# Patient Record
Sex: Female | Born: 1975
Health system: Southern US, Community
[De-identification: ages and names within clinical notes are randomized; demographics above are authoritative.]

## PROBLEM LIST (undated history)

## (undated) ENCOUNTER — Inpatient Hospital Stay (HOSPITAL_COMMUNITY): Payer: Self-pay

## (undated) DIAGNOSIS — N189 Chronic kidney disease, unspecified: Secondary | ICD-10-CM

## (undated) DIAGNOSIS — O09529 Supervision of elderly multigravida, unspecified trimester: Secondary | ICD-10-CM

## (undated) DIAGNOSIS — R87619 Unspecified abnormal cytological findings in specimens from cervix uteri: Secondary | ICD-10-CM

## (undated) DIAGNOSIS — D649 Anemia, unspecified: Secondary | ICD-10-CM

## (undated) DIAGNOSIS — O9989 Other specified diseases and conditions complicating pregnancy, childbirth and the puerperium: Secondary | ICD-10-CM

## (undated) DIAGNOSIS — I499 Cardiac arrhythmia, unspecified: Secondary | ICD-10-CM

## (undated) DIAGNOSIS — Z5189 Encounter for other specified aftercare: Secondary | ICD-10-CM

## (undated) DIAGNOSIS — I10 Essential (primary) hypertension: Secondary | ICD-10-CM

## (undated) HISTORY — DX: Chronic kidney disease, unspecified: N18.9

## (undated) HISTORY — DX: Supervision of elderly multigravida, unspecified trimester: O09.529

## (undated) HISTORY — DX: Essential (primary) hypertension: I10

## (undated) HISTORY — DX: Anemia, unspecified: D64.9

## (undated) HISTORY — DX: Other specified diseases and conditions complicating pregnancy, childbirth and the puerperium: O99.89

## (undated) HISTORY — DX: Unspecified abnormal cytological findings in specimens from cervix uteri: R87.619

## (undated) HISTORY — DX: Cardiac arrhythmia, unspecified: I49.9

## (undated) HISTORY — PX: CERVICAL BIOPSY  W/ LOOP ELECTRODE EXCISION: SUR135

---
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
## 2001-11-16 HISTORY — PX: WISDOM TOOTH EXTRACTION: SHX21

## 2003-11-17 DIAGNOSIS — R87619 Unspecified abnormal cytological findings in specimens from cervix uteri: Secondary | ICD-10-CM

## 2003-11-17 HISTORY — DX: Unspecified abnormal cytological findings in specimens from cervix uteri: R87.619

## 2011-08-03 ENCOUNTER — Ambulatory Visit (INDEPENDENT_AMBULATORY_CARE_PROVIDER_SITE_OTHER): Payer: PRIVATE HEALTH INSURANCE | Admitting: Advanced Practice Midwife

## 2011-08-03 DIAGNOSIS — O039 Complete or unspecified spontaneous abortion without complication: Secondary | ICD-10-CM

## 2011-08-03 NOTE — Progress Notes (Deleted)
   Subjective:    Connie Mills is a G1P0 Unknown being seen today for her first obstetrical visit.  Her obstetrical history is significant for {ob risk factors:10154}. Patient {does/does not:19097} intend to breast feed. Pregnancy history fully reviewed.  Patient reports {sx:14538}.  There were no vitals filed for this visit.  HISTORY: OB History    Grav Para Term Preterm Abortions TAB SAB Ect Mult Living   1              # Outc Date GA Lbr Len/2nd Wgt Sex Del Anes PTL Lv   1 GRA            Comments: System Generated. Please review and update pregnancy details.     No past medical history on file. No past surgical history on file. No family history on file.   Exam    Uterine Size: {fundal height:14540}  Pelvic Exam:    Perineum: {Exam; anus and perineum:14598}   Vulva: {vulva exam:14487}   Vagina:  {vaginal exam:14498}   pH: ***   Cervix: {cervix exam:14595}   Adnexa: {adnexa:12223}   Bony Pelvis: {desc; pelvis:14491}  System: Breast:  {Exam; breast:13139::"normal appearance, no masses or tenderness"}   Skin: {pe skin brief ob:314459::"normal coloration and turgor, no rashes"}    Neurologic: {Exam; neuro:16375}   Extremities: {Exam; extremities:15096}   HEENT {exam; ZOXWR:60454}   Mouth/Teeth {pe mouth simple ob:314450::"mucous membranes moist, pharynx normal without lesions"}   Neck {Neck (ob):32092}   Cardiovascular: {HEART EXAM HEM/ONC:21750}   Respiratory:  {Exam; respiratory:5782::"appears well, vitals normal, no respiratory distress, acyanotic, normal RR","ear and throat exam is normal","neck free of mass or lymphadenopathy","chest clear, no wheezing, crepitations, rhonchi, normal symmetric air entry"}   Abdomen: {Exam; abdomen:16834}   Urinary: {exam; urinary female:30845}      Assessment:    Pregnancy: G1P0 There is no problem list on file for this patient.       Plan:     Initial labs drawn. Prenatal vitamins. Problem list reviewed and  updated. Genetic Screening discussed {GENETIC SCREENING TEST:22046}: {requests/ordered/declines:14581}.  Ultrasound discussed; fetal survey: {requests/ordered/declines:14581}.  Follow up in {numbers 0-4:31231} weeks. ***% of *** min visit spent on counseling and coordination of care.  ***   FRAZIER,NATALIE 08/03/2011

## 2011-08-03 NOTE — Patient Instructions (Addendum)
Miscarriage  An early pregnancy loss or spontaneous abortion (miscarriage) is a common problem. This usually happens when the pregnancy is not developing normally. It is very unlikely that you or your partner did anything to cause this, although cigarette smoking, a sexually transmitted disease, excessive alcohol use, or drug abuse can increase the risk. Other causes are:   Abnormalities of the uterus.    Hormone or medical problems.    Trauma or genetic (chromosome) problems.   Having a miscarriage does not change your chances of having a normal pregnancy in the future. Your caregiver will advise when to try to get pregnant again.  AFTER A MISCARRIAGE   A miscarriage is inevitable when there is continual, heavy vaginal bleeding; cramping; dilation of the outlet of the womb (cervix); or passing of any pregnancy tissue. Bleeding and cramping will usually continue until all the tissue has been removed from the womb (uterus).    Often the uterus does not clean itself out completely and a medication or a D&C procedure is needed to loosen or remove the pregnancy tissue from the uterus. A D&C scrapes or suctions the tissue out.    If you are RH negative, you may need to have Rh immune globulin to avoid Rh problems.    You may be given medication to fight an infection if the miscarriage was due to an infection.   HOME CARE INSTRUCTIONS   You should rest in bed for the next 2 to 3 days.    Do not take tub baths or put anything in your birth canal (vagina), including tampons or douche.    Avoid exercise or heavy activities until directed by your caregiver.    Do not have sex until your caregiver approves.    Save any vaginal discharge that looks like tissue. Ask your caregiver if he or she wants to inspect the discharge.    If you and your partner are having problems with guilt or grieving, talk to your caregiver or get counseling to help you understand and cope with your pregnancy loss.    Allow enough time to  grieve before trying to get pregnant again.   SEEK IMMEDIATE MEDICAL CARE IF:   You have persistent heavy bleeding or a bad smelling vaginal discharge.    You have continued belly (abdominal) or pelvic pain.    You develop a fever.    You have severe weakness, fainting, or repeated vomiting.    You develop chills.    You are experiencing domestic violence.   MAKE SURE YOU:   Understand these instructions.    Will watch your condition.    Will get help right away if you are not doing well or get worse.   Document Released: 12/10/2004 Document Re-Released: 04/22/2010  [employer] Patient Information 2011 [employer].

## 2011-08-03 NOTE — Progress Notes (Signed)
B/P 202/93  B/P recheck 179/88  Pt referred to Dr Linford Arnold  For B/P monitoring P 60  Wt 188lb

## 2011-08-03 NOTE — Progress Notes (Signed)
Addended by: Granville Lewis on: 08/03/2011 04:48 PM   Modules accepted: Orders

## 2011-08-03 NOTE — Progress Notes (Signed)
Subjective:    Patient ID: Connie Mills, female    DOB: 1976-08-11, 35 y.o.   MRN: [id number]  HPI Pt was just informed by her PCP, Dr. Orson Slick in [place], [place] that she appears to be having a miscarriage. Was originally scheduled for New OB visit today. Bleeding started on Tuesday, + UPT on Wednesday. HCG 204 at 12 AM on 9/14, 112 at 3:45 PM on 9/14 (different labs). Pt states that she passed tissue believed to be POC, sent for pathology at [facility], results are pending. U/S showed no IUP. Bleeding has decreased, only mild cramping and light bleeding today. PMH significant for CHTN, was on Topral and Norvasc, was switched to Aldomet 250 mg BID by her PCP as she is trying to conceive.   Past Medical History  Diagnosis Date  . Hypertension   . Abnormal Pap smear of cervix 2005    Leep  . Blood transfusion complicating pregnancy 1993    OB History    Grav Para Term Preterm Abortions TAB SAB Ect Mult Living   2 1 1  1  1   1           Review of Systems  Constitutional: Negative for fever, chills and fatigue.  Respiratory: Negative.   Cardiovascular: Negative.   Gastrointestinal: Negative for nausea, vomiting, abdominal pain, diarrhea and constipation.  Genitourinary: Positive for vaginal bleeding. Negative for dysuria, urgency, frequency, hematuria, flank pain, vaginal discharge, difficulty urinating, genital sores, vaginal pain, menstrual problem, pelvic pain and dyspareunia.  Musculoskeletal: Negative.   Neurological: Negative.   Psychiatric/Behavioral: Negative.        Objective:   Physical Exam  Nursing note and vitals reviewed. Constitutional: She is oriented to person, place, and time. She appears well-developed and well-nourished. No distress.  Cardiovascular: Normal rate.   Pulmonary/Chest: Effort normal.  Abdominal: Soft. There is no tenderness.  Musculoskeletal: Normal range of motion.  Neurological: She is alert and oriented to person, place, and time.  Skin: Skin is  warm and dry.  Psychiatric: She has a normal mood and affect. Her behavior is normal.       tearful          Assessment & Plan:  35 y.o. G2P1011  ? SAB, repeat quant today If quant decreased, will follow weekly until negative If pain or bleeding increases, go to MAU Pelvic rest, may resume efforts at conception after 1 normal cycle F/U with PCP regarding BP, phone number given for Dr. Eppie Gibson (current PCP is 2 hours away)

## 2011-08-04 LAB — HCG, QUANTITATIVE, PREGNANCY: hCG, Beta Chain, Quant, S: 9.2 m[IU]/mL

## 2011-08-05 ENCOUNTER — Telehealth: Payer: Self-pay | Admitting: *Deleted

## 2011-08-05 NOTE — Telephone Encounter (Signed)
Pt given test results of BHCG and recheck of her B/P was 145/73.  She does have an appt with Dr. Eppie Gibson in [facility]

## 2011-08-10 ENCOUNTER — Other Ambulatory Visit (INDEPENDENT_AMBULATORY_CARE_PROVIDER_SITE_OTHER): Payer: PRIVATE HEALTH INSURANCE

## 2011-08-10 DIAGNOSIS — O039 Complete or unspecified spontaneous abortion without complication: Secondary | ICD-10-CM

## 2011-08-10 NOTE — Progress Notes (Signed)
Pt here for lab only BHCG for f/u SAB.  Will call pt with results

## 2011-08-10 NOTE — Progress Notes (Deleted)
Subjective:     Patient ID: Connie Mills, female   DOB: 11-Oct-1976, 35 y.o.   MRN: [id number]  HPI   Review of Systems     Objective:   Physical Exam     Assessment:     ***    Plan:     ***

## 2011-08-11 ENCOUNTER — Telehealth: Payer: Self-pay | Admitting: *Deleted

## 2011-08-11 LAB — HCG, QUANTITATIVE, PREGNANCY: hCG, Beta Chain, Quant, S: <2 m[IU]/mL

## 2011-08-11 NOTE — Telephone Encounter (Signed)
Pt notified of BHCG <2 which is neg.  Advised pt to wait for 2 cycles before attempting pregnancy.

## 2011-08-12 ENCOUNTER — Encounter: Payer: Self-pay | Admitting: Family Medicine

## 2011-08-12 ENCOUNTER — Telehealth: Payer: Self-pay | Admitting: Family Medicine

## 2011-08-12 ENCOUNTER — Ambulatory Visit (INDEPENDENT_AMBULATORY_CARE_PROVIDER_SITE_OTHER): Payer: PRIVATE HEALTH INSURANCE | Admitting: Family Medicine

## 2011-08-12 VITALS — BP 137/65 | HR 51 | Wt 190.0 lb

## 2011-08-12 DIAGNOSIS — I10 Essential (primary) hypertension: Secondary | ICD-10-CM

## 2011-08-12 NOTE — Patient Instructions (Addendum)
Make sure getting under 1500mg  a day.   We will call you in the next day or two about your labs.

## 2011-08-12 NOTE — Telephone Encounter (Signed)
Call patient: I did receive her lab results from her prior physician. She did have normal kidney function and electrolytes. They have not done a thyroid test I would like to order this. We will print a lab slip and she can go any time this week or next week for the blood work.

## 2011-08-12 NOTE — Telephone Encounter (Signed)
Pt.notified

## 2011-08-12 NOTE — Progress Notes (Signed)
Subjective:    Patient ID: Connie Mills, female    DOB: 1976/07/12, 35 y.o.   MRN: [id number]  HPI  Had a miscarriage about 2 weks ago and ws on plendil and toprol 50 xl at night.  She has been more stressed and emotional since the miscarriage.  Home BPS have been running 160-170s at home and HR in the 50. They inc her aldomet to tid about a week ago.  Has been having higher BP.  Thought last night BP was its best at 130/90. Thought this AM was 151/92.    Review of Systems  Constitutional: Negative for fever, diaphoresis and unexpected weight change.  HENT: Negative for hearing loss, rhinorrhea and tinnitus.   Eyes: Negative for visual disturbance.  Respiratory: Negative for cough and wheezing.   Cardiovascular: Negative for chest pain and palpitations.  Gastrointestinal: Negative for nausea, vomiting, diarrhea and blood in stool.  Genitourinary: Negative for vaginal bleeding, vaginal discharge and difficulty urinating.  Musculoskeletal: Negative for myalgias and arthralgias.  Skin: Negative for rash.  Neurological: Positive for headaches.  Hematological: Negative for adenopathy. Does not bruise/bleed easily.  Psychiatric/Behavioral: Negative for sleep disturbance and dysphoric mood. The patient is not nervous/anxious.    Connie Mills     Objective:   Physical Exam  Constitutional: She is oriented to person, place, and time. She appears well-developed and well-nourished.  HENT:  Head: Normocephalic and atraumatic.  Eyes: Conjunctivae are normal. Pupils are equal, round, and reactive to light.  Neck: Neck supple. No thyromegaly present.  Cardiovascular: Normal rate and regular rhythm.   Pulmonary/Chest: Effort normal and breath sounds normal.  Lymphadenopathy:    She has no cervical adenopathy.  Neurological: She is alert and oriented to person, place, and time.  Skin: Skin is warm and dry.  Psychiatric: She has a normal mood and affect. Her behavior is normal.   No carotid bruits.  No ankle edema.        Assessment & Plan:  Miscarriage - I did offered counseling. She declined at this time. She says she has a good support system.   HTN- Not well controlled. I want to make sure she has an up to  Will get CMP and TSH to make sure these are normal. Also hormonally her body is adjusting. Will call her prior PCP with this. She recently moved her. If all normal and up to date then I will increase her BP med. Continue to follow BPS daily.

## 2011-08-13 LAB — TSH: TSH: 1.319 u[IU]/mL (ref 0.350–4.500)

## 2011-08-28 ENCOUNTER — Encounter (HOSPITAL_COMMUNITY): Payer: Self-pay | Admitting: *Deleted

## 2011-08-28 ENCOUNTER — Inpatient Hospital Stay (HOSPITAL_COMMUNITY)
Admission: AD | Admit: 2011-08-28 | Discharge: 2011-08-28 | Disposition: A | Discharge status: Home / Self Care | Payer: PRIVATE HEALTH INSURANCE | Source: Ambulatory Visit | Attending: Obstetrics and Gynecology | Admitting: Obstetrics and Gynecology

## 2011-08-28 ENCOUNTER — Inpatient Hospital Stay (HOSPITAL_COMMUNITY): Payer: PRIVATE HEALTH INSURANCE

## 2011-08-28 DIAGNOSIS — R109 Unspecified abdominal pain: Secondary | ICD-10-CM | POA: Insufficient documentation

## 2011-08-28 DIAGNOSIS — N83209 Unspecified ovarian cyst, unspecified side: Secondary | ICD-10-CM

## 2011-08-28 LAB — URINALYSIS, ROUTINE W REFLEX MICROSCOPIC
Bilirubin Urine: NEGATIVE
Glucose, UA: NEGATIVE mg/dL
Hgb urine dipstick: NEGATIVE
Ketones, ur: NEGATIVE mg/dL
Leukocytes, UA: NEGATIVE
Nitrite: NEGATIVE
Protein, ur: NEGATIVE mg/dL
Specific Gravity, Urine: 1.025 (ref 1.005–1.030)
Urobilinogen, UA: 0.2 mg/dL (ref 0.0–1.0)
pH: 6 (ref 5.0–8.0)

## 2011-08-28 LAB — WET PREP, GENITAL
Clue Cells Wet Prep HPF POC: NONE SEEN
Trich, Wet Prep: NONE SEEN
Yeast Wet Prep HPF POC: NONE SEEN

## 2011-08-28 LAB — POCT PREGNANCY, URINE: Preg Test, Ur: NEGATIVE

## 2011-08-28 MED ORDER — IBUPROFEN 600 MG PO TABS: 600.0000 mg | ORAL_TABLET | Freq: Four times a day (QID) | ORAL | Status: AC | PRN

## 2011-08-28 MED ORDER — KETOROLAC TROMETHAMINE 60 MG/2ML IM SOLN
60.0000 mg | Freq: Once | INTRAMUSCULAR | Status: AC
  Administered 2011-08-28: 60 mg via INTRAMUSCULAR
  Filled 2011-08-28: qty 2

## 2011-08-28 NOTE — Progress Notes (Signed)
Pt states she had a miscarriage in September and had her HCG levels followed at her MD down to 2. Pt taking Tylenol with codeine with minimal relief. C/o left lower constant pain, no vaginal bleeding or discharge.

## 2011-08-28 NOTE — ED Provider Notes (Signed)
History     Chief Complaint  Patient presents with  . Abdominal Pain   HPI Pt states she had a miscarriage in September and had her HCG levels followed at her MD down to 2. Pt taking Tylenol with codeine with minimal relief. C/o unresolved left lower pelvic pain, no vaginal bleeding or discharge.  Previously seen at [facility].     Past Medical History  Diagnosis Date  . Hypertension   . Blood transfusion complicating pregnancy 1993  . Abnormal Pap smear of cervix 2005    Leep; normal pap after    Past Surgical History  Procedure Date  . Cesarean section 1993     Breech  . Wisdom tooth extraction 2003  . Cervical biopsy  w/ loop electrode excision     Family History  Problem Relation Age of Onset  . Hypertension Mother   . Hypertension Brother   . Diabetes Maternal Grandmother   . Cancer Paternal Grandfather   . Hypertension Sister   . Thyroid disease Sister   . Thyroid disease Maternal Aunt     History  Substance Use Topics  . Smoking status: Never Smoker   . Smokeless tobacco: Never Used  . Alcohol Use: No    Allergies:  Allergies  Allergen Reactions  . Dilaudid (Hydromorphone Hcl) Itching    Prescriptions prior to admission  Medication Sig Dispense Refill  . acetaminophen (TYLENOL) 500 MG tablet Take 500 mg by mouth every 6 (six) hours as needed. For pain       . HYDROcodone-acetaminophen (LORTAB) 7.5-500 MG per tablet Take 1 tablet by mouth every 4 (four) hours as needed. For pain       . methyldopa (ALDOMET) 250 MG tablet Take 250 mg by mouth 3 (three) times daily.       . Multiple Vitamin (MULTIVITAMIN) tablet Take 1 tablet by mouth daily.         Review of Systems  Gastrointestinal: Positive for abdominal pain.  All other systems reviewed and are negative.   Physical Exam   Blood pressure 162/99, pulse 68, temperature 99.7 F (37.6 C), temperature source Oral, resp. rate 16, height 5' 6.5" (1.689 m), weight 85.367 kg (188 lb 3.2 oz),  last menstrual period 06/16/2011, SpO2 99.00%, unknown if currently breastfeeding.  Physical Exam  Constitutional: She is oriented to person, place, and time. She appears well-developed and well-nourished.  HENT:  Head: Normocephalic.  Neck: Normal range of motion. Neck supple.  Cardiovascular: Normal rate, regular rhythm and normal heart sounds.   Respiratory: Effort normal and breath sounds normal.  GI: Soft. She exhibits no mass. There is tenderness (LLQ). There is no guarding.  Genitourinary: Uterus is not enlarged. Cervix exhibits no motion tenderness. Right adnexum displays no mass, no tenderness and no fullness. Left adnexum displays tenderness and fullness. No bleeding around the vagina.       Negative cervical motion tenderness  Neurological: She is alert and oriented to person, place, and time.  Skin: Skin is warm and dry.    MAU Course  Procedures  Toradol 60 mg IM [place] - Left ovary: Normal appearance/no adnexal mass, measuring 3.7 x 2.2  x 2.6 cm. Suspected 2.3 cm corpus luteal cyst. Urine pregnancy - negative Wet prep - negative   Assessment and Plan  Left Ovarian Cyst  Plan: DC to home RX Ibuprofen F/U prn  [facility] 08/28/2011, 6:02 PM

## 2011-08-28 NOTE — Progress Notes (Signed)
Pt states she had a miscarriage in September and was seen at [facility]. Had follow up with her primary physician until [facility] were 2. States she has had LLQ pain since the miscarriage. No bleeding or discharge.

## 2011-08-29 LAB — GC/CHLAMYDIA PROBE AMP, GENITAL
Chlamydia, DNA Probe: NEGATIVE
GC Probe Amp, Genital: NEGATIVE

## 2011-09-02 NOTE — ED Provider Notes (Signed)
Agree with above note.  Connie Mills 09/02/2011 4:18 PM

## 2011-09-04 ENCOUNTER — Ambulatory Visit: Payer: PRIVATE HEALTH INSURANCE

## 2011-09-17 ENCOUNTER — Telehealth: Payer: Self-pay | Admitting: Family Medicine

## 2011-09-17 NOTE — Telephone Encounter (Signed)
Department of social services calling and needs information since pt has applied for pregnancy medicaid coverage.   Plan:  Told the rep calling I will need the pt to call and give verbal permission for me to release information regarding pt situation.  Rep voiced understanding and will have the pt call our office to give verbal permission to release information. Jarvis Newcomer, LPN [facility]

## 2011-09-29 ENCOUNTER — Ambulatory Visit (INDEPENDENT_AMBULATORY_CARE_PROVIDER_SITE_OTHER): Payer: PRIVATE HEALTH INSURANCE | Admitting: Obstetrics & Gynecology

## 2011-09-29 ENCOUNTER — Encounter: Payer: Self-pay | Admitting: Obstetrics & Gynecology

## 2011-09-29 VITALS — BP 133/60 | HR 50 | Temp 98.4°F | Resp 16 | Ht 67.0 in | Wt 195.0 lb

## 2011-09-29 DIAGNOSIS — Z0000 Encounter for general adult medical examination without abnormal findings: Secondary | ICD-10-CM

## 2011-09-29 DIAGNOSIS — Z113 Encounter for screening for infections with a predominantly sexual mode of transmission: Secondary | ICD-10-CM

## 2011-09-29 DIAGNOSIS — Z1272 Encounter for screening for malignant neoplasm of vagina: Secondary | ICD-10-CM

## 2011-09-29 DIAGNOSIS — O0910 Supervision of pregnancy with history of ectopic or molar pregnancy, unspecified trimester: Secondary | ICD-10-CM

## 2011-09-29 DIAGNOSIS — Z01419 Encounter for gynecological examination (general) (routine) without abnormal findings: Secondary | ICD-10-CM

## 2011-09-29 LAB — POCT URINE PREGNANCY: Preg Test, Ur: POSITIVE

## 2011-09-29 MED ORDER — ONDANSETRON HCL 8 MG PO TABS: 8.0000 mg | ORAL_TABLET | Freq: Three times a day (TID) | ORAL | Status: DC | PRN

## 2011-09-29 NOTE — Progress Notes (Signed)
Subjective:    Connie Mills is a 35 y.o. female who presents for an annual exam. The patient has no complaints today except for abdominal bloating and nausea, breasts somewhat sore.  She is having unprotected sex. The patient is sexually active. GYN screening history: last pap: was normal. The patient wears seatbelts: yes. The patient participates in regular exercise: no. Has the patient ever been transfused or tattooed?: yes.(needed a transfusion after the delivery of her son). The patient reports that there is not domestic violence in her life.   Menstrual History: OB History    Grav Para Term Preterm Abortions TAB SAB Ect Mult Living   2 1 1  1  1   1       Menarche age: 26 No LMP recorded.    The following portions of the patient's history were reviewed and updated as appropriate: allergies, current medications, past family history, past medical history, past social history, past surgical history and problem list.  Review of Systems A comprehensive review of systems was negative.    Objective:    BP 133/60  Pulse 50  Temp(Src) 98.4 F (36.9 C) (Oral)  Resp 16  Ht 5\' 7"  (1.702 m)  Wt 195 lb (88.451 kg)  BMI 30.54 kg/m2  Breastfeeding? No  General Appearance:    Alert, cooperative, no distress, appears stated age  Head:    Normocephalic, without obvious abnormality, atraumatic  Eyes:    PERRL, conjunctiva/corneas clear, EOM's intact, fundi    benign, both eyes  Ears:    Normal TM's and external ear canals, both ears  Nose:   Nares normal, septum midline, mucosa normal, no drainage    or sinus tenderness  Throat:   Lips, mucosa, and tongue normal; teeth and gums normal  Neck:   Supple, symmetrical, trachea midline, no adenopathy;    thyroid:  no enlargement/tenderness/nodules; no carotid   bruit or JVD  Back:     Symmetric, no curvature, ROM normal, no CVA tenderness  Lungs:     Clear to auscultation bilaterally, respirations unlabored  Chest Wall:    No tenderness or  deformity   Heart:    Regular rate and rhythm, S1 and S2 normal, no murmur, rub   or gallop  Breast Exam:    No tenderness, masses, or nipple abnormality  Abdomen:     Soft, non-tender, bowel sounds active all four quadrants,    no masses, no organomegaly  Genitalia:    Normal female without lesion, discharge or tenderness     Extremities:   Extremities normal, atraumatic, no cyanosis or edema  Pulses:   2+ and symmetric all extremities  Skin:   Skin color, texture, turgor normal, no rashes or lesions  Lymph nodes:   Cervical, supraclavicular, and axillary nodes normal  Neurologic:   CNII-XII intact, normal strength, sensation and reflexes    throughout  .    Assessment:    Healthy female exam.  Positive pregnancy test   Plan:     Thin prep Pap smear. CVX cultures. Schedule u/s for dating Schedule NOB visit.  I will refill zofran at her request.

## 2011-09-29 NOTE — Progress Notes (Signed)
Addended by: Granville Lewis on: 09/29/2011 05:10 PM   Modules accepted: Orders

## 2011-09-29 NOTE — Progress Notes (Signed)
Addended by: Granville Lewis on: 09/29/2011 05:20 PM   Modules accepted: Orders

## 2011-10-02 ENCOUNTER — Other Ambulatory Visit: Payer: Self-pay | Admitting: Obstetrics & Gynecology

## 2011-10-02 ENCOUNTER — Other Ambulatory Visit: Payer: PRIVATE HEALTH INSURANCE

## 2011-10-02 DIAGNOSIS — O0910 Supervision of pregnancy with history of ectopic or molar pregnancy, unspecified trimester: Secondary | ICD-10-CM

## 2011-10-05 ENCOUNTER — Ambulatory Visit
Admission: RE | Admit: 2011-10-05 | Discharge: 2011-10-05 | Disposition: A | Discharge status: Home / Self Care | Payer: PRIVATE HEALTH INSURANCE | Source: Ambulatory Visit | Attending: Obstetrics & Gynecology | Admitting: Obstetrics & Gynecology

## 2011-10-05 DIAGNOSIS — O0910 Supervision of pregnancy with history of ectopic or molar pregnancy, unspecified trimester: Secondary | ICD-10-CM

## 2011-10-06 ENCOUNTER — Encounter: Payer: Self-pay | Admitting: Obstetrics & Gynecology

## 2011-10-06 ENCOUNTER — Ambulatory Visit (INDEPENDENT_AMBULATORY_CARE_PROVIDER_SITE_OTHER): Payer: PRIVATE HEALTH INSURANCE | Admitting: Obstetrics & Gynecology

## 2011-10-06 DIAGNOSIS — O10019 Pre-existing essential hypertension complicating pregnancy, unspecified trimester: Secondary | ICD-10-CM

## 2011-10-06 DIAGNOSIS — B3731 Acute candidiasis of vulva and vagina: Secondary | ICD-10-CM

## 2011-10-06 DIAGNOSIS — B373 Candidiasis of vulva and vagina: Secondary | ICD-10-CM

## 2011-10-06 DIAGNOSIS — O09529 Supervision of elderly multigravida, unspecified trimester: Secondary | ICD-10-CM

## 2011-10-06 MED ORDER — FLUCONAZOLE 150 MG PO TABS: 150.0000 mg | ORAL_TABLET | Freq: Once | ORAL | Status: AC

## 2011-10-06 NOTE — Progress Notes (Signed)
p-70  Needs RX for yeast from pap smear

## 2011-10-06 NOTE — Progress Notes (Signed)
Patient underwent full annual exam on 09/29/11; no exam indicated today.  It was during that encounter that she discovered she was pregnant; ultrasound done yesterday confirmed a 9 week viable IUP.  Of note, pap smear showed candida vulvovaginitis, patient desires Diflucan for treatment.  Patient counseled regarding chronic HTN in pregnancy, risks of HTN in pregnancy and need increased surveillance.  Told to continue Methyldopa for now, will adjust regimen as needed. Patient is AMA, recommended first trimester screening, will be ordered today.  OB labs ordered, patient also to collect 24 hour urine for baseline proteinuria analysis. Routine first trimester counseling provided.  Patient to RTC in one week to recheck her BP, address any further concerns and draw prenatal labs.

## 2011-10-06 NOTE — Patient Instructions (Signed)
Pregnancy - First Trimester During sexual intercourse, millions of sperm go into the vagina. Only 1 sperm will penetrate and fertilize the female egg while it is in the Fallopian tube. One week later, the fertilized egg implants into the wall of the uterus. An embryo begins to develop into a baby. At 6 to 8 weeks, the eyes and face are formed and the heartbeat can be seen on ultrasound. At the end of 12 weeks (first trimester), all the baby's organs are formed. Now that you are pregnant, you will want to do everything you can to have a healthy baby. Two of the most important things are to get good prenatal care and follow your caregiver's instructions. Prenatal care is all the medical care you receive before the baby's birth. It is given to prevent, find, and treat problems during the pregnancy and childbirth. PRENATAL EXAMS  During prenatal visits, your weight, blood pressure and urine are checked. This is done to make sure you are healthy and progressing normally during the pregnancy.   A pregnant woman should gain 25 to 35 pounds during the pregnancy. However, if you are over weight or underweight, your caregiver will advise you regarding your weight.   Your caregiver will ask and answer questions for you.   Blood work, cervical cultures, other necessary tests and a Pap test are done during your prenatal exams. These tests are done to check on your health and the probable health of your baby. Tests are strongly recommended and done for HIV with your permission. This is the virus that causes AIDS. These tests are done because medications can be given to help prevent your baby from being born with this infection should you have been infected without knowing it. Blood work is also used to find out your blood type, previous infections and follow your blood levels (hemoglobin).   Low hemoglobin (anemia) is common during pregnancy. Iron and vitamins are given to help prevent this. Later in the pregnancy,  blood tests for diabetes will be done along with any other tests if any problems develop. You may need tests to make sure you and the baby are doing well.   You may need other tests to make sure you and the baby are doing well.  CHANGES DURING THE FIRST TRIMESTER (THE FIRST 3 MONTHS OF PREGNANCY) Your body goes through many changes during pregnancy. They vary from person to person. Talk to your caregiver about changes you notice and are concerned about. Changes can include:  Your menstrual period stops.   The egg and sperm carry the genes that determine what you look like. Genes from you and your partner are forming a baby. The female genes determine whether the baby is a boy or a girl.   Your body increases in girth and you may feel bloated.   Feeling sick to your stomach (nauseous) and throwing up (vomiting). If the vomiting is uncontrollable, call your caregiver.   Your breasts will begin to enlarge and become tender.   Your nipples may stick out more and become darker.   The need to urinate more. Painful urination may mean you have a bladder infection.   Tiring easily.   Loss of appetite.   Cravings for certain kinds of food.   At first, you may gain or lose a couple of pounds.   You may have changes in your emotions from day to day (excited to be pregnant or concerned something may go wrong with the pregnancy and baby).     You may have more vivid and strange dreams.  HOME CARE INSTRUCTIONS   It is very important to avoid all smoking, alcohol and un-prescribed drugs during your pregnancy. These affect the formation and growth of the baby. Avoid chemicals while pregnant to ensure the delivery of a healthy infant.   Start your prenatal visits by the 12th week of pregnancy. They are usually scheduled monthly at first, then more often in the last 2 months before delivery. Keep your caregiver's appointments. Follow your caregiver's instructions regarding medication use, blood and lab  tests, exercise, and diet.   During pregnancy, you are providing food for you and your baby. Eat regular, well-balanced meals. Choose foods such as meat, fish, milk and other low fat dairy products, vegetables, fruits, and whole-grain breads and cereals. Your caregiver will tell you of the ideal weight gain.   You can help morning sickness by keeping soda crackers at the bedside. Eat a couple before arising in the morning. You may want to use the crackers without salt on them.   Eating 4 to 5 small meals rather than 3 large meals a day also may help the nausea and vomiting.   Drinking liquids between meals instead of during meals also seems to help nausea and vomiting.   A physical sexual relationship may be continued throughout pregnancy if there are no other problems. Problems may be early (premature) leaking of amniotic fluid from the membranes, vaginal bleeding, or belly (abdominal) pain.   Exercise regularly if there are no restrictions. Check with your caregiver or physical therapist if you are unsure of the safety of some of your exercises. Greater weight gain will occur in the last 2 trimesters of pregnancy. Exercising will help:   Control your weight.   Keep you in shape.   Prepare you for labor and delivery.   Help you lose your pregnancy weight after you deliver your baby.   Wear a good support or jogging bra for breast tenderness during pregnancy. This may help if worn during sleep too.   Ask when prenatal classes are available. Begin classes when they are offered.   Do not use hot tubs, steam rooms or saunas.   Wear your seat belt when driving. This protects you and your baby if you are in an accident.   Avoid raw meat, uncooked cheese, cat litter boxes and soil used by cats throughout the pregnancy. These carry germs that can cause birth defects in the baby.   The first trimester is a good time to visit your dentist for your dental health. Getting your teeth cleaned is  OK. Use a softer toothbrush and brush gently during pregnancy.   Ask for help if you have financial, counseling or nutritional needs during pregnancy. Your caregiver will be able to offer counseling for these needs as well as refer you for other special needs.   Do not take any medications or herbs unless told by your caregiver.   Inform your caregiver if there is any mental or physical domestic violence.   Make a list of emergency phone numbers of family, friends, hospital, and police and fire departments.   Write down your questions. Take them to your prenatal visit.   Do not douche.   Do not cross your legs.   If you have to stand for long periods of time, rotate you feet or take small steps in a circle.   You may have more vaginal secretions that may require a sanitary pad. Do not use   tampons or scented sanitary pads.  MEDICATIONS AND DRUG USE IN PREGNANCY  Take prenatal vitamins as directed. The vitamin should contain 1 milligram of folic acid. Keep all vitamins out of reach of children. Only a couple vitamins or tablets containing iron may be fatal to a baby or young child when ingested.   Avoid use of all medications, including herbs, over-the-counter medications, not prescribed or suggested by your caregiver. Only take over-the-counter or prescription medicines for pain, discomfort, or fever as directed by your caregiver. Do not use aspirin, ibuprofen, or naproxen unless directed by your caregiver.   Let your caregiver also know about herbs you may be using.   Alcohol is related to a number of birth defects. This includes fetal alcohol syndrome. All alcohol, in any form, should be avoided completely. Smoking will cause low birth rate and premature babies.   Street or illegal drugs are very harmful to the baby. They are absolutely forbidden. A baby born to an addicted mother will be addicted at birth. The baby will go through the same withdrawal an adult does.   Let your  caregiver know about any medications that you have to take and for what reason you take them.  MISCARRIAGE IS COMMON DURING PREGNANCY A miscarriage does not mean you did something wrong. It is not a reason to worry about getting pregnant again. Your caregiver will help you with questions you may have. If you have a miscarriage, you may need minor surgery. SEEK MEDICAL CARE IF:  You have any concerns or worries during your pregnancy. It is better to call with your questions if you feel they cannot wait, rather than worry about them. SEEK IMMEDIATE MEDICAL CARE IF:   An unexplained oral temperature above 102 F (38.9 C) develops, or as your caregiver suggests.   You have leaking of fluid from the vagina (birth canal). If leaking membranes are suspected, take your temperature and inform your caregiver of this when you call.   There is vaginal spotting or bleeding. Notify your caregiver of the amount and how many pads are used.   You develop a bad smelling vaginal discharge with a change in the color.   You continue to feel sick to your stomach (nauseated) and have no relief from remedies suggested. You vomit blood or coffee ground-like materials.   You lose more than 2 pounds of weight in 1 week.   You gain more than 2 pounds of weight in 1 week and you notice swelling of your face, hands, feet, or legs.   You gain 5 pounds or more in 1 week (even if you do not have swelling of your hands, face, legs, or feet).   You get exposed to German measles and have never had them.   You are exposed to fifth disease or chickenpox.   You develop belly (abdominal) pain. Round ligament discomfort is a common non-cancerous (benign) cause of abdominal pain in pregnancy. Your caregiver still must evaluate this.   You develop headache, fever, diarrhea, pain with urination, or shortness of breath.   You fall or are in a car accident or have any kind of trauma.   There is mental or physical violence in  your home.  Document Released: 10/27/2001 Document Revised: 07/15/2011 Document Reviewed: 04/30/2009 [employer] Patient Information 2012 [employer]. 

## 2011-10-07 ENCOUNTER — Other Ambulatory Visit: Payer: Self-pay | Admitting: Obstetrics & Gynecology

## 2011-10-07 DIAGNOSIS — Z3682 Encounter for antenatal screening for nuchal translucency: Secondary | ICD-10-CM

## 2011-10-07 DIAGNOSIS — O09529 Supervision of elderly multigravida, unspecified trimester: Secondary | ICD-10-CM

## 2011-10-13 ENCOUNTER — Other Ambulatory Visit: Payer: PRIVATE HEALTH INSURANCE

## 2011-10-13 ENCOUNTER — Ambulatory Visit (INDEPENDENT_AMBULATORY_CARE_PROVIDER_SITE_OTHER): Payer: PRIVATE HEALTH INSURANCE | Admitting: *Deleted

## 2011-10-13 VITALS — BP 120/71 | HR 63 | Temp 97.0°F | Resp 16 | Ht 67.0 in | Wt 192.0 lb

## 2011-10-13 DIAGNOSIS — Z23 Encounter for immunization: Secondary | ICD-10-CM

## 2011-10-13 MED ORDER — INFLUENZA VIRUS VACC SPLIT PF IM SUSP
0.5000 mL | INTRAMUSCULAR | Status: AC
  Administered 2011-10-13: 0.5 mL via INTRAMUSCULAR

## 2011-10-19 MED ORDER — FLUCONAZOLE 150 MG PO TABS: 150.0000 mg | ORAL_TABLET | Freq: Once | ORAL | Status: AC

## 2011-10-19 NOTE — Progress Notes (Signed)
Addended by: Granville Lewis on: 10/19/2011 02:52 PM   Modules accepted: Orders

## 2011-10-19 NOTE — Progress Notes (Signed)
Addended by: Jaynie Collins A on: 10/19/2011 03:01 PM   Modules accepted: Orders

## 2011-10-20 LAB — COMPREHENSIVE METABOLIC PANEL
ALT: 15 U/L (ref 0–35)
AST: 14 U/L (ref 0–37)
Albumin: 4 g/dL (ref 3.5–5.2)
Alkaline Phosphatase: 45 U/L (ref 39–117)
BUN: 11 mg/dL (ref 6–23)
CO2: 25 mEq/L (ref 19–32)
Calcium: 9.7 mg/dL (ref 8.4–10.5)
Chloride: 97 mEq/L (ref 96–112)
Creat: 0.76 mg/dL (ref 0.50–1.10)
Glucose, Bld: 76 mg/dL (ref 70–99)
Potassium: 4.2 mEq/L (ref 3.5–5.3)
Sodium: 135 mEq/L (ref 135–145)
Total Bilirubin: 0.3 mg/dL (ref 0.3–1.2)
Total Protein: 7.4 g/dL (ref 6.0–8.3)

## 2011-10-20 LAB — CBC
HCT: 33.2 % — ABNORMAL LOW (ref 36.0–46.0)
Hemoglobin: 10.6 g/dL — ABNORMAL LOW (ref 12.0–15.0)
MCH: 23.5 pg — ABNORMAL LOW (ref 26.0–34.0)
MCHC: 31.9 g/dL (ref 30.0–36.0)
MCV: 73.5 fL — ABNORMAL LOW (ref 78.0–100.0)
Platelets: 310 10*3/uL (ref 150–400)
RBC: 4.52 MIL/uL (ref 3.87–5.11)
RDW: 15.1 % (ref 11.5–15.5)
WBC: 8.9 10*3/uL (ref 4.0–10.5)

## 2011-10-20 LAB — CREATININE CLEARANCE, URINE, 24 HOUR
Creatinine Clearance: 159 mL/min — ABNORMAL HIGH (ref 75–115)
Creatinine, 24H Ur: 1738 mg/d (ref 700–1800)
Creatinine, Urine: 88 mg/dL
Creatinine: 0.76 mg/dL (ref 0.50–1.10)

## 2011-10-20 LAB — PROTEIN, URINE, 24 HOUR

## 2011-10-23 ENCOUNTER — Ambulatory Visit (HOSPITAL_COMMUNITY)
Admission: RE | Admit: 2011-10-23 | Discharge: 2011-10-23 | Disposition: A | Discharge status: Home / Self Care | Payer: PRIVATE HEALTH INSURANCE | Source: Ambulatory Visit | Attending: Obstetrics & Gynecology | Admitting: Obstetrics & Gynecology

## 2011-10-23 ENCOUNTER — Ambulatory Visit (HOSPITAL_COMMUNITY): Admission: RE | Admit: 2011-10-23 | Payer: PRIVATE HEALTH INSURANCE | Source: Ambulatory Visit

## 2011-10-23 DIAGNOSIS — O3510X0 Maternal care for (suspected) chromosomal abnormality in fetus, unspecified, not applicable or unspecified: Secondary | ICD-10-CM | POA: Insufficient documentation

## 2011-10-23 DIAGNOSIS — O351XX0 Maternal care for (suspected) chromosomal abnormality in fetus, not applicable or unspecified: Secondary | ICD-10-CM | POA: Insufficient documentation

## 2011-10-23 DIAGNOSIS — Z3689 Encounter for other specified antenatal screening: Secondary | ICD-10-CM | POA: Insufficient documentation

## 2011-10-23 DIAGNOSIS — Z3682 Encounter for antenatal screening for nuchal translucency: Secondary | ICD-10-CM

## 2011-10-23 DIAGNOSIS — O09529 Supervision of elderly multigravida, unspecified trimester: Secondary | ICD-10-CM

## 2011-10-23 NOTE — Progress Notes (Signed)
Genetic Counseling  High-Risk Gestation Note  Appointment Date:  10/23/2011 Referred By: Tereso Newcomer, MD Date of Birth:  08-16-1976 Partner:  Keane Police  Pregnancy History: [id number] Estimated Date of Delivery: 05/05/12 Estimated Gestational Age: [redacted]w[redacted]d Attending: Berna Spare, MD  Connie Mills and her husband, Mr. Marialuisa Kozan, were seen for genetic counseling because of a maternal age of 35 y.o.Marland Kitchen     They were counseled regarding maternal age and the association with risk for chromosome conditions due to nondisjunction with aging of the ova.   We reviewed chromosomes, nondisjunction, and the associated 1 in 80 risk for fetal aneuploidy related to a maternal age of 35 y.o. at [redacted]w[redacted]d gestation.  They were counseled that the risk for aneuploidy decreases as gestational age increases, accounting for those pregnancies which spontaneously abort.  We specifically discussed Down syndrome (trisomy 26), trisomies 38 and 26, and sex chromosome aneuploidies (47,XXX and 47,XXY) including the common features and prognoses of each.   We reviewed available screening and diagnostic options.  Regarding screening tests, we discussed the options of First screen, Quad screen and ultrasound.  They understand that screening tests are used to modify a patient's a priori risk for aneuploidy, typically based on age.  This estimate provides a pregnancy specific risk assessment.  We also reviewed the availability of diagnostic options including CVS and amniocentesis.  We discussed the risks, limitations, and benefits of each.  After reviewing these options, Ms. Grzeskowiak elected to have ultrasound for nuchal translucency assessment at this time and targeted ultrasound in the second trimester, but declined First screen, Quad screen, CVS and amniocentesis.  The couple stated that they do not wish to pursue additional screening or testing for aneuploidy aside from ultrasound given that screening has the  potential to provoke anxiety. She declined CVS and amniocentesis at this time and in the future given the associated risk of complications. She understands that ultrasound cannot rule out all birth defects or genetic syndromes. However, they were counseled that 50-80% of fetuses with Down syndrome and up to 90% of fetuses with trisomies 13 and 18, when well visualized, have detectable anomalies or soft markers by ultrasound.   Mrs. Turnier was provided with written information regarding sickle cell anemia (SCA) including the carrier frequency and incidence in the African-American population, the availability of carrier testing and prenatal diagnosis if indicated.  In addition, we discussed that hemoglobinopathies are routinely screened for as part of the [facility] newborn screening panel.  She declined hemoglobin electrophoresis today.  Both family histories were reviewed and found to be contributory for the patient's sister with a history of two miscarriages and one ectopic pregnancy. An underlying cause is not known for these losses. Approximately 1 in 6 confirmed pregnancies results in miscarriage. A single underlying cause is more likely to be suspected when a couple has experienced 3 or more losses. It is less likely that there will be an identifiable single underlying cause when a couple has experienced less than 3 losses. We discussed that a small proportion of these could be underlying genetic reasons that may or may not have implications for relatives.  The patient may contact us should additional information be obtained regarding an underlying cause for her sister's miscarriages.   Additionally, Mr. Om reported a female maternal first cousin who is in a wheelchair. He is currently in his late 17s and is otherwise healthy. Limited information was known regarding this relative, and Mr. Mallicoat thinks that he has been in a  wheelchair all of his life. The underlying reason is not known. We discussed that  additional information is needed to assess whether or not there is an underlying genetic cause. Without further information regarding the provided family history, an accurate genetic risk cannot be calculated. Further genetic counseling is warranted if more information is obtained.  Mrs. Henniger denied exposure to environmental toxins or chemical agents. She denied the use of alcohol, tobacco or street drugs. She denied significant viral illnesses during the course of her pregnancy. Her medical and surgical histories were noncontributory.   I counseled this couple regarding the above risks and available options.  The approximate face-to-face time with the genetic counselor was 30 minutes.  Quinn Plowman, MS,  Certified Genetic Counselor 10/23/2011

## 2011-10-23 NOTE — Progress Notes (Signed)
Ms. Zurita was seen for ultrasound appointment today.  Please see [facility] report for details.  

## 2011-10-27 ENCOUNTER — Encounter: Payer: Self-pay | Admitting: Obstetrics & Gynecology

## 2011-10-27 DIAGNOSIS — O10919 Unspecified pre-existing hypertension complicating pregnancy, unspecified trimester: Secondary | ICD-10-CM | POA: Insufficient documentation

## 2011-11-30 ENCOUNTER — Ambulatory Visit (INDEPENDENT_AMBULATORY_CARE_PROVIDER_SITE_OTHER): Payer: PRIVATE HEALTH INSURANCE | Admitting: Advanced Practice Midwife

## 2011-11-30 VITALS — BP 130/70 | Temp 98.0°F | Wt 194.0 lb

## 2011-11-30 DIAGNOSIS — O09529 Supervision of elderly multigravida, unspecified trimester: Secondary | ICD-10-CM

## 2011-11-30 DIAGNOSIS — Z3480 Encounter for supervision of other normal pregnancy, unspecified trimester: Secondary | ICD-10-CM

## 2011-11-30 MED ORDER — ONDANSETRON HCL 8 MG PO TABS: 8.0000 mg | ORAL_TABLET | Freq: Three times a day (TID) | ORAL | Status: DC | PRN

## 2011-11-30 NOTE — Patient Instructions (Signed)
Pregnancy - Second Trimester The second trimester of pregnancy (3 to 6 months) is a period of rapid growth for you and your baby. At the end of the sixth month, your baby is about 9 inches long and weighs 1 1/2 pounds. You will begin to feel the baby move between 18 and 20 weeks of the pregnancy. This is called quickening. Weight gain is faster. A clear fluid (colostrum) may leak out of your breasts. You may feel small contractions of the womb (uterus). This is known as false labor or Braxton-Hicks contractions. This is like a practice for labor when the baby is ready to be born. Usually, the problems with morning sickness have usually passed by the end of your first trimester. Some women develop small dark blotches (called cholasma, mask of pregnancy) on their face that usually goes away after the baby is born. Exposure to the sun makes the blotches worse. Acne may also develop in some pregnant women and pregnant women who have acne, may find that it goes away. PRENATAL EXAMS  Blood work may continue to be done during prenatal exams. These tests are done to check on your health and the probable health of your baby. Blood work is used to follow your blood levels (hemoglobin). Anemia (low hemoglobin) is common during pregnancy. Iron and vitamins are given to help prevent this. You will also be checked for diabetes between 24 and 28 weeks of the pregnancy. Some of the previous blood tests may be repeated.   The size of the uterus is measured during each visit. This is to make sure that the baby is continuing to grow properly according to the dates of the pregnancy.   Your blood pressure is checked every prenatal visit. This is to make sure you are not getting toxemia.   Your urine is checked to make sure you do not have an infection, diabetes or protein in the urine.   Your weight is checked often to make sure gains are happening at the suggested rate. This is to ensure that both you and your baby are  growing normally.   Sometimes, an ultrasound is performed to confirm the proper growth and development of the baby. This is a test which bounces harmless sound waves off the baby so your caregiver can more accurately determine due dates.  Sometimes, a specialized test is done on the amniotic fluid surrounding the baby. This test is called an amniocentesis. The amniotic fluid is obtained by sticking a needle into the belly (abdomen). This is done to check the chromosomes in instances where there is a concern about possible genetic problems with the baby. It is also sometimes done near the end of pregnancy if an early delivery is required. In this case, it is done to help make sure the baby's lungs are mature enough for the baby to live outside of the womb. CHANGES OCCURING IN THE SECOND TRIMESTER OF PREGNANCY Your body goes through many changes during pregnancy. They vary from person to person. Talk to your caregiver about changes you notice that you are concerned about.  During the second trimester, you will likely have an increase in your appetite. It is normal to have cravings for certain foods. This varies from person to person and pregnancy to pregnancy.   Your lower abdomen will begin to bulge.   You may have to urinate more often because the uterus and baby are pressing on your bladder. It is also common to get more bladder infections during pregnancy (  pain with urination). You can help this by drinking lots of fluids and emptying your bladder before and after intercourse.   You may begin to get stretch marks on your hips, abdomen, and breasts. These are normal changes in the body during pregnancy. There are no exercises or medications to take that prevent this change.   You may begin to develop swollen and bulging veins (varicose veins) in your legs. Wearing support hose, elevating your feet for 15 minutes, 3 to 4 times a day and limiting salt in your diet helps lessen the problem.    Heartburn may develop as the uterus grows and pushes up against the stomach. Antacids recommended by your caregiver helps with this problem. Also, eating smaller meals 4 to 5 times a day helps.   Constipation can be treated with a stool softener or adding bulk to your diet. Drinking lots of fluids, vegetables, fruits, and whole grains are helpful.   Exercising is also helpful. If you have been very active up until your pregnancy, most of these activities can be continued during your pregnancy. If you have been less active, it is helpful to start an exercise program such as walking.   Hemorrhoids (varicose veins in the rectum) may develop at the end of the second trimester. Warm sitz baths and hemorrhoid cream recommended by your caregiver helps hemorrhoid problems.   Backaches may develop during this time of your pregnancy. Avoid heavy lifting, wear low heal shoes and practice good posture to help with backache problems.   Some pregnant women develop tingling and numbness of their hand and fingers because of swelling and tightening of ligaments in the wrist (carpel tunnel syndrome). This goes away after the baby is born.   As your breasts enlarge, you may have to get a bigger bra. Get a comfortable, cotton, support bra. Do not get a nursing bra until the last month of the pregnancy if you will be nursing the baby.   You may get a dark line from your belly button to the pubic area called the linea nigra.   You may develop rosy cheeks because of increase blood flow to the face.   You may develop spider looking lines of the face, neck, arms and chest. These go away after the baby is born.  HOME CARE INSTRUCTIONS   It is extremely important to avoid all smoking, herbs, alcohol, and unprescribed drugs during your pregnancy. These chemicals affect the formation and growth of the baby. Avoid these chemicals throughout the pregnancy to ensure the delivery of a healthy infant.   Most of your home  care instructions are the same as suggested for the first trimester of your pregnancy. Keep your caregiver's appointments. Follow your caregiver's instructions regarding medication use, exercise and diet.   During pregnancy, you are providing food for you and your baby. Continue to eat regular, well-balanced meals. Choose foods such as meat, fish, milk and other low fat dairy products, vegetables, fruits, and whole-grain breads and cereals. Your caregiver will tell you of the ideal weight gain.   A physical sexual relationship may be continued up until near the end of pregnancy if there are no other problems. Problems could include early (premature) leaking of amniotic fluid from the membranes, vaginal bleeding, abdominal pain, or other medical or pregnancy problems.   Exercise regularly if there are no restrictions. Check with your caregiver if you are unsure of the safety of some of your exercises. The greatest weight gain will occur in the   last 2 trimesters of pregnancy. Exercise will help you:   Control your weight.   Get you in shape for labor and delivery.   Lose weight after you have the baby.   Wear a good support or jogging bra for breast tenderness during pregnancy. This may help if worn during sleep. Pads or tissues may be used in the bra if you are leaking colostrum.   Do not use hot tubs, steam rooms or saunas throughout the pregnancy.   Wear your seat belt at all times when driving. This protects you and your baby if you are in an accident.   Avoid raw meat, uncooked cheese, cat litter boxes and soil used by cats. These carry germs that can cause birth defects in the baby.   The second trimester is also a good time to visit your dentist for your dental health if this has not been done yet. Getting your teeth cleaned is OK. Use a soft toothbrush. Brush gently during pregnancy.   It is easier to loose urine during pregnancy. Tightening up and strengthening the pelvic muscles will  help with this problem. Practice stopping your urination while you are going to the bathroom. These are the same muscles you need to strengthen. It is also the muscles you would use as if you were trying to stop from passing gas. You can practice tightening these muscles up 10 times a set and repeating this about 3 times per day. Once you know what muscles to tighten up, do not perform these exercises during urination. It is more likely to contribute to an infection by backing up the urine.   Ask for help if you have financial, counseling or nutritional needs during pregnancy. Your caregiver will be able to offer counseling for these needs as well as refer you for other special needs.   Your skin may become oily. If so, wash your face with mild soap, use non-greasy moisturizer and oil or cream based makeup.  MEDICATIONS AND DRUG USE IN PREGNANCY  Take prenatal vitamins as directed. The vitamin should contain 1 milligram of folic acid. Keep all vitamins out of reach of children. Only a couple vitamins or tablets containing iron may be fatal to a baby or young child when ingested.   Avoid use of all medications, including herbs, over-the-counter medications, not prescribed or suggested by your caregiver. Only take over-the-counter or prescription medicines for pain, discomfort, or fever as directed by your caregiver. Do not use aspirin.   Let your caregiver also know about herbs you may be using.   Alcohol is related to a number of birth defects. This includes fetal alcohol syndrome. All alcohol, in any form, should be avoided completely. Smoking will cause low birth rate and premature babies.   Street or illegal drugs are very harmful to the baby. They are absolutely forbidden. A baby born to an addicted mother will be addicted at birth. The baby will go through the same withdrawal an adult does.  SEEK MEDICAL CARE IF:  You have any concerns or worries during your pregnancy. It is better to call with  your questions if you feel they cannot wait, rather than worry about them. SEEK IMMEDIATE MEDICAL CARE IF:   An unexplained oral temperature above 102 F (38.9 C) develops, or as your caregiver suggests.   You have leaking of fluid from the vagina (birth canal). If leaking membranes are suspected, take your temperature and tell your caregiver of this when you call.   There   is vaginal spotting, bleeding, or passing clots. Tell your caregiver of the amount and how many pads are used. Light spotting in pregnancy is common, especially following intercourse.   You develop a bad smelling vaginal discharge with a change in the color from clear to white.   You continue to feel sick to your stomach (nauseated) and have no relief from remedies suggested. You vomit blood or coffee ground-like materials.   You lose more than 2 pounds of weight or gain more than 2 pounds of weight over 1 week, or as suggested by your caregiver.   You notice swelling of your face, hands, feet, or legs.   You get exposed to German measles and have never had them.   You are exposed to fifth disease or chickenpox.   You develop belly (abdominal) pain. Round ligament discomfort is a common non-cancerous (benign) cause of abdominal pain in pregnancy. Your caregiver still must evaluate you.   You develop a bad headache that does not go away.   You develop fever, diarrhea, pain with urination, or shortness of breath.   You develop visual problems, blurry, or double vision.   You fall or are in a car accident or any kind of trauma.   There is mental or physical violence at home.  Document Released: 10/27/2001 Document Revised: 07/15/2011 Document Reviewed: 05/01/2009 [employer] Patient Information 2012 [employer]. Breastfeeding BENEFITS OF BREASTFEEDING For the baby  The first milk (colostrum) helps the baby's digestive system function better.   There are antibodies from the mother in the milk that help the  baby fight off infections.   The baby has a lower incidence of asthma, allergies, and SIDS (sudden infant death syndrome).   The nutrients in breast milk are better than formulas for the baby and helps the baby's brain grow better.   Babies who breastfeed have less gas, colic, and constipation.  For the mother  Breastfeeding helps develop a very special bond between mother and baby.   It is more convenient, always available at the correct temperature and cheaper than formula feeding.   It burns calories in the mother and helps with losing weight that was gained during pregnancy.   It makes the uterus contract back down to normal size faster and slows bleeding following delivery.   Breastfeeding mothers have a lower risk of developing breast cancer.  NURSE FREQUENTLY  A healthy, full-term baby may breastfeed as often as every hour or space his or her feedings to every 3 hours.   How often to nurse will vary from baby to baby. Watch your baby for signs of hunger, not the clock.   Nurse as often as the baby requests, or when you feel the need to reduce the fullness of your breasts.   Awaken the baby if it has been 3 to 4 hours since the last feeding.   Frequent feeding will help the mother make more milk and will prevent problems like sore nipples and engorgement of the breasts.  BABY'S POSITION AT THE BREAST  Whether lying down or sitting, be sure that the baby's tummy is facing your tummy.   Support the breast with 4 fingers underneath the breast and the thumb above. Make sure your fingers are well away from the nipple and baby's mouth.   Stroke the baby's lips and cheek closest to the breast gently with your finger or nipple.   When the baby's mouth is open wide enough, place all of your nipple and as much   of the dark area around the nipple as possible into your baby's mouth.   Pull the baby in close so the tip of the nose and the baby's cheeks touch the breast during the  feeding.  FEEDINGS  The length of each feeding varies from baby to baby and from feeding to feeding.   The baby must suck about 2 to 3 minutes for your milk to get to him or her. This is called a "let down." For this reason, allow the baby to feed on each breast as long as he or she wants. Your baby will end the feeding when he or she has received the right balance of nutrients.   To break the suction, put your finger into the corner of the baby's mouth and slide it between his or her gums before removing your breast from his or her mouth. This will help prevent sore nipples.  REDUCING BREAST ENGORGEMENT  In the first week after your baby is born, you may experience signs of breast engorgement. When breasts are engorged, they feel heavy, warm, full, and may be tender to the touch. You can reduce engorgement if you:   Nurse frequently, every 2 to 3 hours. Mothers who breastfeed early and often have fewer problems with engorgement.   Place light ice packs on your breasts between feedings. This reduces swelling. Wrap the ice packs in a lightweight towel to protect your skin.   Apply moist hot packs to your breast for 5 to 10 minutes before each feeding. This increases circulation and helps the milk flow.   Gently massage your breast before and during the feeding.   Make sure that the baby empties at least one breast at every feeding before switching sides.   Use a breast pump to empty the breasts if your baby is sleepy or not nursing well. You may also want to pump if you are returning to work or or you feel you are getting engorged.   Avoid bottle feeds, pacifiers or supplemental feedings of water or juice in place of breastfeeding.   Be sure the baby is latched on and positioned properly while breastfeeding.   Prevent fatigue, stress, and anemia.   Wear a supportive bra, avoiding underwire styles.   Eat a balanced diet with enough fluids.  If you follow these suggestions, your  engorgement should improve in 24 to 48 hours. If you are still experiencing difficulty, call your lactation consultant or caregiver. IS MY BABY GETTING ENOUGH MILK? Sometimes, mothers worry about whether their babies are getting enough milk. You can be assured that your baby is getting enough milk if:  The baby is actively sucking and you hear swallowing.   The baby nurses at least 8 to 12 times in a 24 hour time period. Nurse your baby until he or she unlatches or falls asleep at the first breast (at least 10 to 20 minutes), then offer the second side.   The baby is wetting 5 to 6 disposable diapers (6 to 8 cloth diapers) in a 24 hour period by 5 to 6 days of age.   The baby is having at least 2 to 3 stools every 24 hours for the first few months. Breast milk is all the food your baby needs. It is not necessary for your baby to have water or formula. In fact, to help your breasts make more milk, it is best not to give your baby supplemental feedings during the early weeks.   The stool should   be soft and yellow.   The baby should gain 4 to 7 ounces per week after he is 4 days old.  TAKE CARE OF YOURSELF Take care of your breasts by:  Bathing or showering daily.   Avoiding the use of soaps on your nipples.   Start feedings on your left breast at one feeding and on your right breast at the next feeding.   You will notice an increase in your milk supply 2 to 5 days after delivery. You may feel some discomfort from engorgement, which makes your breasts very firm and often tender. Engorgement "peaks" out within 24 to 48 hours. In the meantime, apply warm moist towels to your breasts for 5 to 10 minutes before feeding. Gentle massage and expression of some milk before feeding will soften your breasts, making it easier for your baby to latch on. Wear a well fitting nursing bra and air dry your nipples for 10 to 15 minutes after each feeding.   Only use cotton bra pads.   Only use pure lanolin on  your nipples after nursing. You do not need to wash it off before nursing.  Take care of yourself by:   Eating well-balanced meals and nutritious snacks.   Drinking milk, fruit juice, and water to satisfy your thirst (about 8 glasses a day).   Getting plenty of rest.   Increasing calcium in your diet (1200 mg a day).   Avoiding foods that you notice affect the baby in a bad way.  SEEK MEDICAL CARE IF:   You have any questions or difficulty with breastfeeding.   You need help.   You have a hard, red, sore area on your breast, accompanied by a fever of 100.5 F (38.1 C) or more.   Your baby is too sleepy to eat well or is having trouble sleeping.   Your baby is wetting less than 6 diapers per day, by 5 days of age.   Your baby's skin or white part of his or her eyes is more yellow than it was in the hospital.   You feel depressed.  Document Released: 11/02/2005 Document Revised: 07/15/2011 Document Reviewed: 06/17/2009 [employer] Patient Information 2012 [employer]. 

## 2011-11-30 NOTE — Progress Notes (Signed)
Nausea continues, but otherwise well. Having some round ligament pain, rev'd normal pregnancy symptoms vs. Warning signs. Declines quad today. Anatomy u/s ordered.

## 2011-11-30 NOTE — Progress Notes (Signed)
p-95  Cont to have Nausea.

## 2011-12-11 ENCOUNTER — Ambulatory Visit (HOSPITAL_COMMUNITY): Admission: RE | Admit: 2011-12-11 | Payer: PRIVATE HEALTH INSURANCE | Source: Ambulatory Visit

## 2011-12-11 ENCOUNTER — Ambulatory Visit (HOSPITAL_COMMUNITY)
Admission: RE | Admit: 2011-12-11 | Discharge: 2011-12-11 | Disposition: A | Discharge status: Home / Self Care | Payer: PRIVATE HEALTH INSURANCE | Source: Ambulatory Visit | Attending: Advanced Practice Midwife | Admitting: Advanced Practice Midwife

## 2011-12-11 DIAGNOSIS — O34219 Maternal care for unspecified type scar from previous cesarean delivery: Secondary | ICD-10-CM | POA: Insufficient documentation

## 2011-12-11 DIAGNOSIS — O358XX0 Maternal care for other (suspected) fetal abnormality and damage, not applicable or unspecified: Secondary | ICD-10-CM | POA: Insufficient documentation

## 2011-12-11 DIAGNOSIS — O09529 Supervision of elderly multigravida, unspecified trimester: Secondary | ICD-10-CM | POA: Insufficient documentation

## 2011-12-11 DIAGNOSIS — Z363 Encounter for antenatal screening for malformations: Secondary | ICD-10-CM | POA: Insufficient documentation

## 2011-12-11 DIAGNOSIS — O10019 Pre-existing essential hypertension complicating pregnancy, unspecified trimester: Secondary | ICD-10-CM | POA: Insufficient documentation

## 2011-12-11 DIAGNOSIS — Z1389 Encounter for screening for other disorder: Secondary | ICD-10-CM | POA: Insufficient documentation

## 2011-12-11 DIAGNOSIS — O3440 Maternal care for other abnormalities of cervix, unspecified trimester: Secondary | ICD-10-CM | POA: Insufficient documentation

## 2011-12-28 ENCOUNTER — Ambulatory Visit (INDEPENDENT_AMBULATORY_CARE_PROVIDER_SITE_OTHER): Payer: PRIVATE HEALTH INSURANCE | Admitting: Family

## 2011-12-28 ENCOUNTER — Other Ambulatory Visit: Payer: Self-pay | Admitting: *Deleted

## 2011-12-28 VITALS — BP 117/66 | Temp 98.0°F | Wt 203.0 lb

## 2011-12-28 DIAGNOSIS — O09529 Supervision of elderly multigravida, unspecified trimester: Secondary | ICD-10-CM

## 2011-12-28 DIAGNOSIS — IMO0002 Reserved for concepts with insufficient information to code with codable children: Secondary | ICD-10-CM

## 2011-12-28 DIAGNOSIS — Z3480 Encounter for supervision of other normal pregnancy, unspecified trimester: Secondary | ICD-10-CM

## 2011-12-28 DIAGNOSIS — O358XX0 Maternal care for other (suspected) fetal abnormality and damage, not applicable or unspecified: Secondary | ICD-10-CM

## 2011-12-28 DIAGNOSIS — Z98891 History of uterine scar from previous surgery: Secondary | ICD-10-CM | POA: Insufficient documentation

## 2011-12-28 DIAGNOSIS — O35BXX0 Maternal care for other (suspected) fetal abnormality and damage, fetal cardiac anomalies, not applicable or unspecified: Secondary | ICD-10-CM | POA: Insufficient documentation

## 2011-12-28 NOTE — Progress Notes (Signed)
Reviewed [place] results (left echo cardiac focus); obtained prenatal labs (not previously obtained); reviewed risks and benefits of TOL vs repeat csection; given handout and plans to discuss with husband.

## 2011-12-28 NOTE — Progress Notes (Signed)
p-83 

## 2011-12-29 LAB — OBSTETRIC PANEL
Antibody Screen: NEGATIVE
Basophils Absolute: 0 10*3/uL (ref 0.0–0.1)
Basophils Relative: 0 % (ref 0–1)
Eosinophils Absolute: 0.2 10*3/uL (ref 0.0–0.7)
Eosinophils Relative: 2 % (ref 0–5)
HCT: 29.9 % — ABNORMAL LOW (ref 36.0–46.0)
Hemoglobin: 9.2 g/dL — ABNORMAL LOW (ref 12.0–15.0)
Hepatitis B Surface Ag: NEGATIVE
Lymphocytes Relative: 19 % (ref 12–46)
Lymphs Abs: 1.8 10*3/uL (ref 0.7–4.0)
MCH: 24.1 pg — ABNORMAL LOW (ref 26.0–34.0)
MCHC: 30.8 g/dL (ref 30.0–36.0)
MCV: 78.5 fL (ref 78.0–100.0)
Monocytes Absolute: 0.7 10*3/uL (ref 0.1–1.0)
Monocytes Relative: 8 % (ref 3–12)
Neutro Abs: 6.7 10*3/uL (ref 1.7–7.7)
Neutrophils Relative %: 71 % (ref 43–77)
Platelets: 313 10*3/uL (ref 150–400)
RBC: 3.81 MIL/uL — ABNORMAL LOW (ref 3.87–5.11)
RDW: 15.9 % — ABNORMAL HIGH (ref 11.5–15.5)
Rh Type: POSITIVE
Rubella: 116 IU/mL — ABNORMAL HIGH
WBC: 9.4 10*3/uL (ref 4.0–10.5)

## 2011-12-29 LAB — SICKLE CELL SCREEN: Sickle Cell Screen: NEGATIVE

## 2011-12-29 LAB — HIV ANTIBODY (ROUTINE TESTING W REFLEX): HIV: NONREACTIVE

## 2011-12-30 LAB — CULTURE, URINE COMPREHENSIVE
Colony Count: NO GROWTH
Organism ID, Bacteria: NO GROWTH

## 2012-01-18 ENCOUNTER — Encounter: Payer: Self-pay | Admitting: Obstetrics and Gynecology

## 2012-01-18 ENCOUNTER — Ambulatory Visit (INDEPENDENT_AMBULATORY_CARE_PROVIDER_SITE_OTHER): Payer: PRIVATE HEALTH INSURANCE | Admitting: Obstetrics and Gynecology

## 2012-01-18 ENCOUNTER — Encounter: Payer: Self-pay | Admitting: *Deleted

## 2012-01-18 DIAGNOSIS — Z3480 Encounter for supervision of other normal pregnancy, unspecified trimester: Secondary | ICD-10-CM

## 2012-01-18 NOTE — Patient Instructions (Signed)

## 2012-01-18 NOTE — Progress Notes (Signed)
p=101 

## 2012-01-18 NOTE — Progress Notes (Signed)
On Aldomet 250 mg tid. EIF - will f/u with growth scan third tri.  Prev C/S breech. Probably wants TOLAC. 1 hr gluc next

## 2012-01-26 ENCOUNTER — Other Ambulatory Visit: Payer: Self-pay | Admitting: Obstetrics & Gynecology

## 2012-01-26 LAB — CBC
HCT: 27.1 % — ABNORMAL LOW (ref 36.0–46.0)
Hemoglobin: 8.2 g/dL — ABNORMAL LOW (ref 12.0–15.0)
MCH: 23.7 pg — ABNORMAL LOW (ref 26.0–34.0)
MCHC: 30.3 g/dL (ref 30.0–36.0)
MCV: 78.3 fL (ref 78.0–100.0)
Platelets: 261 10*3/uL (ref 150–400)
RBC: 3.46 MIL/uL — ABNORMAL LOW (ref 3.87–5.11)
RDW: 15.9 % — ABNORMAL HIGH (ref 11.5–15.5)
WBC: 9.6 10*3/uL (ref 4.0–10.5)

## 2012-01-27 ENCOUNTER — Other Ambulatory Visit: Payer: PRIVATE HEALTH INSURANCE

## 2012-01-27 LAB — GLUCOSE TOLERANCE, 1 HOUR: Glucose, 1 Hour GTT: 81 mg/dL (ref 70–140)

## 2012-01-27 LAB — RPR

## 2012-01-27 LAB — HIV ANTIBODY (ROUTINE TESTING W REFLEX): HIV: NONREACTIVE

## 2012-01-28 ENCOUNTER — Telehealth: Payer: Self-pay | Admitting: *Deleted

## 2012-01-28 NOTE — Telephone Encounter (Signed)
Pt notified of 8.2 Hgb.  She is to continue her prenatal vitamin and add Feso4 325mg  daily and to add Colace 2 daily to help with any constipation.  She is also aware that her 1 hr GTT was normal.

## 2012-02-01 ENCOUNTER — Other Ambulatory Visit: Payer: Self-pay | Admitting: Obstetrics & Gynecology

## 2012-02-01 ENCOUNTER — Encounter: Payer: Self-pay | Admitting: Obstetrics & Gynecology

## 2012-02-01 DIAGNOSIS — D649 Anemia, unspecified: Secondary | ICD-10-CM | POA: Insufficient documentation

## 2012-02-01 MED ORDER — DOCUSATE SODIUM 100 MG PO CAPS: 100.0000 mg | ORAL_CAPSULE | Freq: Two times a day (BID) | ORAL | Status: AC

## 2012-02-01 MED ORDER — FERROUS SULFATE 325 (65 FE) MG PO TABS: 325.0000 mg | ORAL_TABLET | Freq: Every day | ORAL | Status: DC

## 2012-02-01 NOTE — Progress Notes (Signed)
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

## 2012-02-08 ENCOUNTER — Ambulatory Visit (INDEPENDENT_AMBULATORY_CARE_PROVIDER_SITE_OTHER): Payer: PRIVATE HEALTH INSURANCE | Admitting: Physician Assistant

## 2012-02-08 VITALS — BP 155/77 | Temp 97.3°F | Wt 210.0 lb

## 2012-02-08 DIAGNOSIS — O10919 Unspecified pre-existing hypertension complicating pregnancy, unspecified trimester: Secondary | ICD-10-CM

## 2012-02-08 DIAGNOSIS — Z3480 Encounter for supervision of other normal pregnancy, unspecified trimester: Secondary | ICD-10-CM

## 2012-02-08 DIAGNOSIS — O10019 Pre-existing essential hypertension complicating pregnancy, unspecified trimester: Secondary | ICD-10-CM

## 2012-02-08 MED ORDER — LABETALOL HCL 200 MG PO TABS: 200.0000 mg | ORAL_TABLET | Freq: Two times a day (BID) | ORAL | Status: DC

## 2012-02-08 NOTE — Patient Instructions (Signed)

## 2012-02-08 NOTE — Progress Notes (Signed)
p-106  Seen @ [facility] pt had decreased FHT but everything was fine.  Recheck of B/P  131/71

## 2012-02-08 NOTE — Progress Notes (Signed)
Elevated BP today without pre-x s/s. Repeat BP 131/71. Will repeat labs and urine pro/creat today. Discontinue Aldomet, will start labetalol 200mg  BID. FU growth [place] ordered. Pre-x precautions reviewed. FU 1 week for BP check. 2 weeks ROB

## 2012-02-09 LAB — CBC
HCT: 28.4 % — ABNORMAL LOW (ref 36.0–46.0)
Hemoglobin: 9.1 g/dL — ABNORMAL LOW (ref 12.0–15.0)
MCH: 24.5 pg — ABNORMAL LOW (ref 26.0–34.0)
MCHC: 32 g/dL (ref 30.0–36.0)
MCV: 76.3 fL — ABNORMAL LOW (ref 78.0–100.0)
Platelets: 287 10*3/uL (ref 150–400)
RBC: 3.72 MIL/uL — ABNORMAL LOW (ref 3.87–5.11)
RDW: 15.1 % (ref 11.5–15.5)
WBC: 9 10*3/uL (ref 4.0–10.5)

## 2012-02-09 LAB — COMPREHENSIVE METABOLIC PANEL

## 2012-02-09 LAB — PROTEIN / CREATININE INDEX, URINE

## 2012-02-15 ENCOUNTER — Ambulatory Visit (INDEPENDENT_AMBULATORY_CARE_PROVIDER_SITE_OTHER): Payer: PRIVATE HEALTH INSURANCE | Admitting: *Deleted

## 2012-02-15 VITALS — BP 118/65 | HR 93 | Temp 98.0°F | Resp 17 | Ht 67.0 in | Wt 212.0 lb

## 2012-02-15 DIAGNOSIS — O169 Unspecified maternal hypertension, unspecified trimester: Secondary | ICD-10-CM

## 2012-02-15 DIAGNOSIS — IMO0002 Reserved for concepts with insufficient information to code with codable children: Secondary | ICD-10-CM

## 2012-02-15 NOTE — Progress Notes (Signed)
Pt here for BP check only after changing her meds last week.  Per pt BP's running 124/76, 114/71, 118/71.  Todays BP is 118/65.  Pt will continue current regimen and f/u with her regular PN visit as scheduled.  She will cont to monitor her BP @ home.

## 2012-02-16 ENCOUNTER — Encounter (HOSPITAL_COMMUNITY): Payer: Self-pay | Admitting: *Deleted

## 2012-02-16 ENCOUNTER — Inpatient Hospital Stay (HOSPITAL_COMMUNITY)
Admission: AD | Admit: 2012-02-16 | Discharge: 2012-02-17 | Disposition: A | Discharge status: Home / Self Care | Payer: PRIVATE HEALTH INSURANCE | Source: Ambulatory Visit | Attending: Family Medicine | Admitting: Family Medicine

## 2012-02-16 DIAGNOSIS — R109 Unspecified abdominal pain: Secondary | ICD-10-CM | POA: Insufficient documentation

## 2012-02-16 DIAGNOSIS — O10019 Pre-existing essential hypertension complicating pregnancy, unspecified trimester: Secondary | ICD-10-CM

## 2012-02-16 DIAGNOSIS — O10919 Unspecified pre-existing hypertension complicating pregnancy, unspecified trimester: Secondary | ICD-10-CM

## 2012-02-16 DIAGNOSIS — O09529 Supervision of elderly multigravida, unspecified trimester: Secondary | ICD-10-CM

## 2012-02-16 DIAGNOSIS — O99891 Other specified diseases and conditions complicating pregnancy: Secondary | ICD-10-CM | POA: Insufficient documentation

## 2012-02-16 LAB — URINALYSIS, ROUTINE W REFLEX MICROSCOPIC
Bilirubin Urine: NEGATIVE
Glucose, UA: NEGATIVE mg/dL
Hgb urine dipstick: NEGATIVE
Ketones, ur: NEGATIVE mg/dL
Leukocytes, UA: NEGATIVE
Nitrite: NEGATIVE
Protein, ur: NEGATIVE mg/dL
Specific Gravity, Urine: >1.03 — ABNORMAL HIGH (ref 1.005–1.030)
Urobilinogen, UA: 0.2 mg/dL (ref 0.0–1.0)
pH: 5.5 (ref 5.0–8.0)

## 2012-02-16 NOTE — MAU Note (Signed)
WAS IN OFFICE YESTERDAY- FOR BP CHECK- BP MEDS CHANGED

## 2012-02-16 NOTE — MAU Provider Note (Signed)
Connie Mills is a 36 y.o. female presenting for eval of abd pain that occurs with fetal movement. Denies leak, bldg or dysuria. No fever, N/V, diarrhea. Receives [facility]. History OB History    Grav Para Term Preterm Abortions TAB SAB Ect Mult Living   3 1 1  1  1   1      Past Medical History  Diagnosis Date  . Hypertension   . Blood transfusion complicating pregnancy 1993  . Abnormal Pap smear of cervix 2005    Leep; normal pap after   Past Surgical History  Procedure Date  . Cesarean section 1993     Breech  . Wisdom tooth extraction 2003  . Cervical biopsy  w/ loop electrode excision    Family History: family history includes Cancer in her paternal grandfather; Diabetes in her maternal grandmother; Hypertension in her brother, mother, and sister; and Thyroid disease in her maternal aunt and sister. Social History:  reports that she has never smoked. She has never used smokeless tobacco. She reports that she does not drink alcohol or use illicit drugs.  ROS    Blood pressure 133/85, pulse 78, temperature 98.3 F (36.8 C), temperature source Oral, resp. rate 20, height 5\' 7"  (1.702 m), weight 96.843 kg (213 lb 8 oz), last menstrual period 06/16/2011. Maternal Exam:  Uterine Assessment: No ctx per monitor     Fetal Exam Fetal Monitor Review: Baseline rate: 150.  Variability: moderate (6-25 bpm).   Pattern: no decelerations and accelerations present.    Fetal State Assessment: Category I - tracings are normal.     Physical Exam  Constitutional: She is oriented to person, place, and time. She appears well-developed and well-nourished.  HENT:  Head: Normocephalic.  Cardiovascular: Normal rate.   Respiratory: Effort normal.  Genitourinary:       cx C/L  Musculoskeletal: Normal range of motion.  Neurological: She is alert and oriented to person, place, and time.  Skin: Skin is warm and dry.  Psychiatric: She has a normal mood and affect. Her behavior is normal.     Urinalysis    Component Value Date/Time   COLORURINE YELLOW 02/16/2012 2245   APPEARANCEUR CLEAR 02/16/2012 2245   LABSPEC >1.030* 02/16/2012 2245   PHURINE 5.5 02/16/2012 2245   GLUCOSEU NEGATIVE 02/16/2012 2245   HGBUR NEGATIVE 02/16/2012 2245   BILIRUBINUR NEGATIVE 02/16/2012 2245   KETONESUR NEGATIVE 02/16/2012 2245   PROTEINUR NEGATIVE 02/16/2012 2245   UROBILINOGEN 0.2 02/16/2012 2245   NITRITE NEGATIVE 02/16/2012 2245   LEUKOCYTESUR NEGATIVE 02/16/2012 2245      Prenatal labs: ABO, Rh: B/POS/-- (02/11 1037) Antibody: NEG (02/11 1037) Rubella: 116.0 (02/11 1037) RPR: NON REAC (03/12 1045)  HBsAg: NEGATIVE (02/11 1037)  HIV: NON REACTIVE (03/12 1045)  GBS:     Assessment/Plan: IUP at 28.5 wks Abd pain with fetal movement  Reassured re no PTL or UTI. Comfort tips given to promote fetal position change (hands & knees, etc). Keep next appt as scheduled or sooner prn  Cam Hai 02/16/2012, 11:56 PM

## 2012-02-16 NOTE — MAU Note (Signed)
PT SAYS SHE HAS BEEN ABD PRESSURE  SINCE  345PM.  .   FEELS  UC Q 5 MIN - THEN WENT AWAY- THEN TOOK TYLENOL.  HAS PAIN WHEN BABY MOVES.Marland Kitchen  FEELS SAME NOW AS DID  AT HOME

## 2012-02-17 NOTE — MAU Provider Note (Signed)
Chart reviewed and agree with management and plan.  

## 2012-02-17 NOTE — Discharge Instructions (Signed)

## 2012-02-19 ENCOUNTER — Ambulatory Visit (HOSPITAL_COMMUNITY): Payer: PRIVATE HEALTH INSURANCE

## 2012-02-19 ENCOUNTER — Other Ambulatory Visit: Payer: Self-pay | Admitting: Physician Assistant

## 2012-02-19 ENCOUNTER — Ambulatory Visit (HOSPITAL_COMMUNITY)
Admission: RE | Admit: 2012-02-19 | Discharge: 2012-02-19 | Disposition: A | Discharge status: Home / Self Care | Payer: PRIVATE HEALTH INSURANCE | Source: Ambulatory Visit | Attending: Physician Assistant | Admitting: Physician Assistant

## 2012-02-19 VITALS — BP 141/77 | HR 66 | Wt 214.0 lb

## 2012-02-19 DIAGNOSIS — O10919 Unspecified pre-existing hypertension complicating pregnancy, unspecified trimester: Secondary | ICD-10-CM

## 2012-02-19 DIAGNOSIS — O09529 Supervision of elderly multigravida, unspecified trimester: Secondary | ICD-10-CM

## 2012-02-19 DIAGNOSIS — O365990 Maternal care for other known or suspected poor fetal growth, unspecified trimester, not applicable or unspecified: Secondary | ICD-10-CM

## 2012-02-19 DIAGNOSIS — IMO0002 Reserved for concepts with insufficient information to code with codable children: Secondary | ICD-10-CM

## 2012-02-19 DIAGNOSIS — O3660X0 Maternal care for excessive fetal growth, unspecified trimester, not applicable or unspecified: Secondary | ICD-10-CM | POA: Insufficient documentation

## 2012-02-19 DIAGNOSIS — O358XX0 Maternal care for other (suspected) fetal abnormality and damage, not applicable or unspecified: Secondary | ICD-10-CM

## 2012-02-19 DIAGNOSIS — O10019 Pre-existing essential hypertension complicating pregnancy, unspecified trimester: Secondary | ICD-10-CM | POA: Insufficient documentation

## 2012-02-19 DIAGNOSIS — O34219 Maternal care for unspecified type scar from previous cesarean delivery: Secondary | ICD-10-CM | POA: Insufficient documentation

## 2012-02-22 ENCOUNTER — Encounter: Payer: Self-pay | Admitting: Obstetrics and Gynecology

## 2012-02-22 ENCOUNTER — Ambulatory Visit (INDEPENDENT_AMBULATORY_CARE_PROVIDER_SITE_OTHER): Payer: PRIVATE HEALTH INSURANCE | Admitting: Obstetrics and Gynecology

## 2012-02-22 VITALS — BP 122/72 | Temp 98.5°F | Wt 211.0 lb

## 2012-02-22 DIAGNOSIS — Z3480 Encounter for supervision of other normal pregnancy, unspecified trimester: Secondary | ICD-10-CM

## 2012-02-22 DIAGNOSIS — D649 Anemia, unspecified: Secondary | ICD-10-CM

## 2012-02-22 DIAGNOSIS — O99019 Anemia complicating pregnancy, unspecified trimester: Secondary | ICD-10-CM

## 2012-02-22 DIAGNOSIS — O35BXX0 Maternal care for other (suspected) fetal abnormality and damage, fetal cardiac anomalies, not applicable or unspecified: Secondary | ICD-10-CM

## 2012-02-22 DIAGNOSIS — IMO0002 Reserved for concepts with insufficient information to code with codable children: Secondary | ICD-10-CM

## 2012-02-22 DIAGNOSIS — O365990 Maternal care for other known or suspected poor fetal growth, unspecified trimester, not applicable or unspecified: Secondary | ICD-10-CM | POA: Insufficient documentation

## 2012-02-22 DIAGNOSIS — O358XX0 Maternal care for other (suspected) fetal abnormality and damage, not applicable or unspecified: Secondary | ICD-10-CM

## 2012-02-22 DIAGNOSIS — Z9889 Other specified postprocedural states: Secondary | ICD-10-CM

## 2012-02-22 DIAGNOSIS — Z98891 History of uterine scar from previous surgery: Secondary | ICD-10-CM

## 2012-02-22 NOTE — Progress Notes (Signed)
[place] 29th%ile but AC <5th, Dopplers normal. F/U US in 3 wks. Seen at [facility] last wk (lives in [place]) for postcoital bleed which has not recurred - discussed vaginal rest and return if recurs. Diet (inc iron foods  And anti-constipation) discussed. Cont. Labetalol. FATs start at 32 wks. Still desires TOLAC (C/S 19 yrs ago for breech 7#)

## 2012-02-22 NOTE — Patient Instructions (Signed)

## 2012-02-22 NOTE — Progress Notes (Signed)
Pt was seen in ED twice last week.  Once for pain and contractions and then because she had bleeding after intercourse. p-83

## 2012-03-07 ENCOUNTER — Ambulatory Visit (INDEPENDENT_AMBULATORY_CARE_PROVIDER_SITE_OTHER): Payer: PRIVATE HEALTH INSURANCE | Admitting: Advanced Practice Midwife

## 2012-03-07 DIAGNOSIS — O169 Unspecified maternal hypertension, unspecified trimester: Secondary | ICD-10-CM

## 2012-03-07 DIAGNOSIS — Z3480 Encounter for supervision of other normal pregnancy, unspecified trimester: Secondary | ICD-10-CM

## 2012-03-07 NOTE — Progress Notes (Signed)
Well, no headaches/vision changes/abd pain. Has scan on MFM on Friday, biweekly testing to start at 32 weeks, will do NST on Friday at [facility] clinic. NST today d/t slightly tachy FHR by dopper, baseline 150, + accels, reactive NST.

## 2012-03-07 NOTE — Progress Notes (Signed)
p - 111 

## 2012-03-07 NOTE — Patient Instructions (Signed)
Pregnancy - Third Trimester The third trimester of pregnancy (the last 3 months) is a period of the most rapid growth for you and your baby. The baby approaches a length of 20 inches and a weight of 6 to 10 pounds. The baby is adding on fat and getting ready for life outside your body. While inside, babies have periods of sleeping and waking, suck their thumbs, and hiccups. You can often feel small contractions of the uterus. This is false labor. It is also called Braxton-Hicks contractions. This is like a practice for labor. The usual problems in this stage of pregnancy include more difficulty breathing, swelling of the hands and feet from water retention, and having to urinate more often because of the uterus and baby pressing on your bladder.  PRENATAL EXAMS  Blood work may continue to be done during prenatal exams. These tests are done to check on your health and the probable health of your baby. Blood work is used to follow your blood levels (hemoglobin). Anemia (low hemoglobin) is common during pregnancy. Iron and vitamins are given to help prevent this. You may also continue to be checked for diabetes. Some of the past blood tests may be done again.   The size of the uterus is measured during each visit. This makes sure your baby is growing properly according to your pregnancy dates.   Your blood pressure is checked every prenatal visit. This is to make sure you are not getting toxemia.   Your urine is checked every prenatal visit for infection, diabetes and protein.   Your weight is checked at each visit. This is done to make sure gains are happening at the suggested rate and that you and your baby are growing normally.   Sometimes, an ultrasound is performed to confirm the position and the proper growth and development of the baby. This is a test done that bounces harmless sound waves off the baby so your caregiver can more accurately determine due dates.   Discuss the type of pain  medication and anesthesia you will have during your labor and delivery.   Discuss the possibility and anesthesia if a Cesarean Section might be necessary.   Inform your caregiver if there is any mental or physical violence at home.  Sometimes, a specialized non-stress test, contraction stress test and biophysical profile are done to make sure the baby is not having a problem. Checking the amniotic fluid surrounding the baby is called an amniocentesis. The amniotic fluid is removed by sticking a needle into the belly (abdomen). This is sometimes done near the end of pregnancy if an early delivery is required. In this case, it is done to help make sure the baby's lungs are mature enough for the baby to live outside of the womb. If the lungs are not mature and it is unsafe to deliver the baby, an injection of cortisone medication is given to the mother 1 to 2 days before the delivery. This helps the baby's lungs mature and makes it safer to deliver the baby. CHANGES OCCURING IN THE THIRD TRIMESTER OF PREGNANCY Your body goes through many changes during pregnancy. They vary from person to person. Talk to your caregiver about changes you notice and are concerned about.  During the last trimester, you have probably had an increase in your appetite. It is normal to have cravings for certain foods. This varies from person to person and pregnancy to pregnancy.   You may begin to get stretch marks on your hips,   abdomen, and breasts. These are normal changes in the body during pregnancy. There are no exercises or medications to take which prevent this change.   Constipation may be treated with a stool softener or adding bulk to your diet. Drinking lots of fluids, fiber in vegetables, fruits, and whole grains are helpful.   Exercising is also helpful. If you have been very active up until your pregnancy, most of these activities can be continued during your pregnancy. If you have been less active, it is helpful  to start an exercise program such as walking. Consult your caregiver before starting exercise programs.   Avoid all smoking, alcohol, un-prescribed drugs, herbs and "street drugs" during your pregnancy. These chemicals affect the formation and growth of the baby. Avoid chemicals throughout the pregnancy to ensure the delivery of a healthy infant.   Backache, varicose veins and hemorrhoids may develop or get worse.   You will tire more easily in the third trimester, which is normal.   The baby's movements may be stronger and more often.   You may become short of breath easily.   Your belly button may stick out.   A yellow discharge may leak from your breasts called colostrum.   You may have a bloody mucus discharge. This usually occurs a few days to a week before labor begins.  HOME CARE INSTRUCTIONS   Keep your caregiver's appointments. Follow your caregiver's instructions regarding medication use, exercise, and diet.   During pregnancy, you are providing food for you and your baby. Continue to eat regular, well-balanced meals. Choose foods such as meat, fish, milk and other low fat dairy products, vegetables, fruits, and whole-grain breads and cereals. Your caregiver will tell you of the ideal weight gain.   A physical sexual relationship may be continued throughout pregnancy if there are no other problems such as early (premature) leaking of amniotic fluid from the membranes, vaginal bleeding, or belly (abdominal) pain.   Exercise regularly if there are no restrictions. Check with your caregiver if you are unsure of the safety of your exercises. Greater weight gain will occur in the last 2 trimesters of pregnancy. Exercising helps:   Control your weight.   Get you in shape for labor and delivery.   You lose weight after you deliver.   Rest a lot with legs elevated, or as needed for leg cramps or low back pain.   Wear a good support or jogging bra for breast tenderness during  pregnancy. This may help if worn during sleep. Pads or tissues may be used in the bra if you are leaking colostrum.   Do not use hot tubs, steam rooms, or saunas.   Wear your seat belt when driving. This protects you and your baby if you are in an accident.   Avoid raw meat, cat litter boxes and soil used by cats. These carry germs that can cause birth defects in the baby.   It is easier to loose urine during pregnancy. Tightening up and strengthening the pelvic muscles will help with this problem. You can practice stopping your urination while you are going to the bathroom. These are the same muscles you need to strengthen. It is also the muscles you would use if you were trying to stop from passing gas. You can practice tightening these muscles up 10 times a set and repeating this about 3 times per day. Once you know what muscles to tighten up, do not perform these exercises during urination. It is more likely   to cause an infection by backing up the urine.   Ask for help if you have financial, counseling or nutritional needs during pregnancy. Your caregiver will be able to offer counseling for these needs as well as refer you for other special needs.   Make a list of emergency phone numbers and have them available.   Plan on getting help from family or friends when you go home from the hospital.   Make a trial run to the hospital.   Take prenatal classes with the father to understand, practice and ask questions about the labor and delivery.   Prepare the baby's room/nursery.   Do not travel out of the city unless it is absolutely necessary and with the advice of your caregiver.   Wear only low or no heal shoes to have better balance and prevent falling.  MEDICATIONS AND DRUG USE IN PREGNANCY  Take prenatal vitamins as directed. The vitamin should contain 1 milligram of folic acid. Keep all vitamins out of reach of children. Only a couple vitamins or tablets containing iron may be fatal  to a baby or young child when ingested.   Avoid use of all medications, including herbs, over-the-counter medications, not prescribed or suggested by your caregiver. Only take over-the-counter or prescription medicines for pain, discomfort, or fever as directed by your caregiver. Do not use aspirin, ibuprofen (Motrin, Advil, Nuprin) or naproxen (Aleve) unless OK'd by your caregiver.   Let your caregiver also know about herbs you may be using.   Alcohol is related to a number of birth defects. This includes fetal alcohol syndrome. All alcohol, in any form, should be avoided completely. Smoking will cause low birth rate and premature babies.   Street/illegal drugs are very harmful to the baby. They are absolutely forbidden. A baby born to an addicted mother will be addicted at birth. The baby will go through the same withdrawal an adult does.  SEEK MEDICAL CARE IF: You have any concerns or worries during your pregnancy. It is better to call with your questions if you feel they cannot wait, rather than worry about them. DECISIONS ABOUT CIRCUMCISION You may or may not know the sex of your baby. If you know your baby is a boy, it may be time to think about circumcision. Circumcision is the removal of the foreskin of the penis. This is the skin that covers the sensitive end of the penis. There is no proven medical need for this. Often this decision is made on what is popular at the time or based upon religious beliefs and social issues. You can discuss these issues with your caregiver or pediatrician. SEEK IMMEDIATE MEDICAL CARE IF:   An unexplained oral temperature above 102 F (38.9 C) develops, or as your caregiver suggests.   You have leaking of fluid from the vagina (birth canal). If leaking membranes are suspected, take your temperature and tell your caregiver of this when you call.   There is vaginal spotting, bleeding or passing clots. Tell your caregiver of the amount and how many pads are  used.   You develop a bad smelling vaginal discharge with a change in the color from clear to white.   You develop vomiting that lasts more than 24 hours.   You develop chills or fever.   You develop shortness of breath.   You develop burning on urination.   You loose more than 2 pounds of weight or gain more than 2 pounds of weight or as suggested by your   caregiver.   You notice sudden swelling of your face, hands, and feet or legs.   You develop belly (abdominal) pain. Round ligament discomfort is a common non-cancerous (benign) cause of abdominal pain in pregnancy. Your caregiver still must evaluate you.   You develop a severe headache that does not go away.   You develop visual problems, blurred or double vision.   If you have not felt your baby move for more than 1 hour. If you think the baby is not moving as much as usual, eat something with sugar in it and lie down on your left side for an hour. The baby should move at least 4 to 5 times per hour. Call right away if your baby moves less than that.   You fall, are in a car accident or any kind of trauma.   There is mental or physical violence at home.  Document Released: 10/27/2001 Document Revised: 10/22/2011 Document Reviewed: 05/01/2009 [employer] Patient Information 2012 [employer]. 

## 2012-03-11 ENCOUNTER — Ambulatory Visit (HOSPITAL_COMMUNITY)
Admission: RE | Admit: 2012-03-11 | Discharge: 2012-03-11 | Disposition: A | Discharge status: Home / Self Care | Payer: PRIVATE HEALTH INSURANCE | Source: Ambulatory Visit | Attending: Physician Assistant | Admitting: Physician Assistant

## 2012-03-11 ENCOUNTER — Ambulatory Visit (INDEPENDENT_AMBULATORY_CARE_PROVIDER_SITE_OTHER): Payer: PRIVATE HEALTH INSURANCE | Admitting: *Deleted

## 2012-03-11 ENCOUNTER — Other Ambulatory Visit (HOSPITAL_COMMUNITY): Payer: Self-pay | Admitting: Maternal and Fetal Medicine

## 2012-03-11 VITALS — BP 126/71 | Wt 213.0 lb

## 2012-03-11 VITALS — BP 115/70 | HR 92 | Wt 216.0 lb

## 2012-03-11 DIAGNOSIS — O10019 Pre-existing essential hypertension complicating pregnancy, unspecified trimester: Secondary | ICD-10-CM

## 2012-03-11 DIAGNOSIS — O3440 Maternal care for other abnormalities of cervix, unspecified trimester: Secondary | ICD-10-CM | POA: Insufficient documentation

## 2012-03-11 DIAGNOSIS — O3410 Maternal care for benign tumor of corpus uteri, unspecified trimester: Secondary | ICD-10-CM | POA: Insufficient documentation

## 2012-03-11 DIAGNOSIS — O10919 Unspecified pre-existing hypertension complicating pregnancy, unspecified trimester: Secondary | ICD-10-CM

## 2012-03-11 DIAGNOSIS — O09529 Supervision of elderly multigravida, unspecified trimester: Secondary | ICD-10-CM

## 2012-03-11 DIAGNOSIS — O358XX0 Maternal care for other (suspected) fetal abnormality and damage, not applicable or unspecified: Secondary | ICD-10-CM | POA: Insufficient documentation

## 2012-03-11 DIAGNOSIS — O34219 Maternal care for unspecified type scar from previous cesarean delivery: Secondary | ICD-10-CM | POA: Insufficient documentation

## 2012-03-11 DIAGNOSIS — O365990 Maternal care for other known or suspected poor fetal growth, unspecified trimester, not applicable or unspecified: Secondary | ICD-10-CM

## 2012-03-11 NOTE — Progress Notes (Signed)
P = 96   Pt has [place] growth today @ [facility].   She will have fetal testing next week @ [facility] on 03/18/12.

## 2012-03-11 NOTE — Progress Notes (Signed)
Connie Mills was seen for ultrasound appointment today.  Please see [facility] report for details.  

## 2012-03-15 ENCOUNTER — Encounter: Payer: Self-pay | Admitting: Obstetrics & Gynecology

## 2012-03-15 ENCOUNTER — Ambulatory Visit (INDEPENDENT_AMBULATORY_CARE_PROVIDER_SITE_OTHER): Payer: PRIVATE HEALTH INSURANCE | Admitting: Obstetrics & Gynecology

## 2012-03-15 DIAGNOSIS — O479 False labor, unspecified: Secondary | ICD-10-CM

## 2012-03-15 DIAGNOSIS — O0990 Supervision of high risk pregnancy, unspecified, unspecified trimester: Secondary | ICD-10-CM

## 2012-03-15 NOTE — Progress Notes (Signed)
Routine visit. U/S 4-26 showed 31% growth and normal AFI. NST today reactive. No problems. Reports good FM. She will continue weekly NST here and weekly AFI/NST at [facility] clinic (Fridays).

## 2012-03-15 NOTE — Progress Notes (Signed)
p-92  3 episodes of dizziness over the last week.

## 2012-03-16 ENCOUNTER — Other Ambulatory Visit (HOSPITAL_COMMUNITY): Payer: Self-pay | Admitting: Maternal and Fetal Medicine

## 2012-03-16 DIAGNOSIS — O10919 Unspecified pre-existing hypertension complicating pregnancy, unspecified trimester: Secondary | ICD-10-CM

## 2012-03-17 NOTE — Progress Notes (Signed)
NST on 03/11/12--reactive, no decels, moderate variability

## 2012-03-18 ENCOUNTER — Other Ambulatory Visit: Payer: Self-pay | Admitting: Physician Assistant

## 2012-03-18 ENCOUNTER — Ambulatory Visit (HOSPITAL_COMMUNITY)
Admission: RE | Admit: 2012-03-18 | Discharge: 2012-03-18 | Disposition: A | Discharge status: Home / Self Care | Payer: PRIVATE HEALTH INSURANCE | Source: Ambulatory Visit | Attending: Obstetrics & Gynecology | Admitting: Obstetrics & Gynecology

## 2012-03-18 VITALS — BP 124/66 | HR 85 | Wt 219.0 lb

## 2012-03-18 DIAGNOSIS — O358XX0 Maternal care for other (suspected) fetal abnormality and damage, not applicable or unspecified: Secondary | ICD-10-CM | POA: Insufficient documentation

## 2012-03-18 DIAGNOSIS — O365990 Maternal care for other known or suspected poor fetal growth, unspecified trimester, not applicable or unspecified: Secondary | ICD-10-CM

## 2012-03-18 DIAGNOSIS — O09529 Supervision of elderly multigravida, unspecified trimester: Secondary | ICD-10-CM

## 2012-03-18 DIAGNOSIS — O3440 Maternal care for other abnormalities of cervix, unspecified trimester: Secondary | ICD-10-CM | POA: Insufficient documentation

## 2012-03-18 DIAGNOSIS — O10019 Pre-existing essential hypertension complicating pregnancy, unspecified trimester: Secondary | ICD-10-CM | POA: Insufficient documentation

## 2012-03-18 DIAGNOSIS — O3410 Maternal care for benign tumor of corpus uteri, unspecified trimester: Secondary | ICD-10-CM | POA: Insufficient documentation

## 2012-03-18 DIAGNOSIS — O34219 Maternal care for unspecified type scar from previous cesarean delivery: Secondary | ICD-10-CM | POA: Insufficient documentation

## 2012-03-18 DIAGNOSIS — O10919 Unspecified pre-existing hypertension complicating pregnancy, unspecified trimester: Secondary | ICD-10-CM

## 2012-03-18 NOTE — Progress Notes (Signed)
Patient seen for BPP.  See full report in [facility].  Single IUP at 33 1/7 weeks Normal amniotic fluid volulme The fetus is active with a BPP of 8/8.  Continue 2x weekly antepartum fetal testing - follow up growth scan/ Dopplers in 3 weeks.  Alpha Gula, MD

## 2012-03-19 ENCOUNTER — Inpatient Hospital Stay (HOSPITAL_COMMUNITY)
Admission: AD | Admit: 2012-03-19 | Discharge: 2012-03-20 | Disposition: A | Discharge status: Home / Self Care | Payer: PRIVATE HEALTH INSURANCE | Source: Ambulatory Visit | Attending: Family Medicine | Admitting: Family Medicine

## 2012-03-19 ENCOUNTER — Encounter (HOSPITAL_COMMUNITY): Payer: Self-pay

## 2012-03-19 DIAGNOSIS — O4700 False labor before 37 completed weeks of gestation, unspecified trimester: Secondary | ICD-10-CM | POA: Insufficient documentation

## 2012-03-19 DIAGNOSIS — O09529 Supervision of elderly multigravida, unspecified trimester: Secondary | ICD-10-CM

## 2012-03-19 DIAGNOSIS — O10919 Unspecified pre-existing hypertension complicating pregnancy, unspecified trimester: Secondary | ICD-10-CM

## 2012-03-19 LAB — URINALYSIS, ROUTINE W REFLEX MICROSCOPIC
Bilirubin Urine: NEGATIVE
Glucose, UA: NEGATIVE mg/dL
Hgb urine dipstick: NEGATIVE
Ketones, ur: NEGATIVE mg/dL
Leukocytes, UA: NEGATIVE
Nitrite: NEGATIVE
Protein, ur: NEGATIVE mg/dL
Specific Gravity, Urine: 1.025 (ref 1.005–1.030)
Urobilinogen, UA: 0.2 mg/dL (ref 0.0–1.0)
pH: 6 (ref 5.0–8.0)

## 2012-03-19 NOTE — MAU Provider Note (Signed)
History     CSN: [id number]  Arrival date and time: 03/19/12 2221   First Provider Initiated Contact with Patient 03/19/12 2302      Chief Complaint  Patient presents with  . Contractions  . Abdominal Cramping  . Abdominal Pain   HPI  Pt here with report of contractions that started at 0200 today.  Reports intercourse.  Denies vaginal bleeding or leaking of fluid.  Chronic hypertension, denies PIH symptoms.    Past Medical History  Diagnosis Date  . Hypertension   . Blood transfusion complicating pregnancy 1993  . Abnormal Pap smear of cervix 2005    Leep; normal pap after    Past Surgical History  Procedure Date  . Wisdom tooth extraction 2003  . Cervical biopsy  w/ loop electrode excision   . Cesarean section 1993     Breech    Family History  Problem Relation Age of Onset  . Hypertension Mother   . Hypertension Brother   . Diabetes Maternal Grandmother   . Cancer Paternal Grandfather   . Hypertension Sister   . Thyroid disease Sister   . Thyroid disease Maternal Aunt     History  Substance Use Topics  . Smoking status: Never Smoker   . Smokeless tobacco: Never Used  . Alcohol Use: No    Allergies:  Allergies  Allergen Reactions  . Dilaudid (Hydromorphone Hcl) Itching    Prescriptions prior to admission  Medication Sig Dispense Refill  . acetaminophen (TYLENOL) 500 MG tablet Take 500 mg by mouth every 6 (six) hours as needed. For pain       . diphenhydrAMINE (BENADRYL) 25 MG tablet Take 25 mg by mouth every 6 (six) hours as needed.      . ferrous sulfate 325 (65 FE) MG tablet Take 325 mg by mouth daily.      Marland Kitchen labetalol (NORMODYNE) 200 MG tablet Take 200 mg by mouth 2 (two) times daily.      . ondansetron (ZOFRAN) 8 MG tablet Take 8 mg by mouth every 8 (eight) hours as needed.      . polyethylene glycol powder (GLYCOLAX/MIRALAX) powder Take 17 g by mouth daily.      . Prenatal Vit-Fe Fumarate-FA (PRENATAL MULTIVITAMIN) TABS Take 1 tablet by mouth  daily.        Review of Systems  Gastrointestinal: Positive for abdominal pain.  All other systems reviewed and are negative.   Physical Exam   Blood pressure 157/87, pulse 98, temperature 97.8 F (36.6 C), temperature source Oral, resp. rate 18, height 5\' 6"  (1.676 m), weight 99.791 kg (220 lb), last menstrual period 06/16/2011, unknown if currently breastfeeding.  Physical Exam  Constitutional: She is oriented to person, place, and time. She appears well-developed and well-nourished.  HENT:  Head: Normocephalic.  Neck: Normal range of motion. Neck supple.  Cardiovascular: Normal rate, regular rhythm and normal heart sounds.   Respiratory: Effort normal and breath sounds normal.  GI: Soft. There is no tenderness.  Genitourinary: No bleeding around the vagina. Vaginal discharge (mucusy) found.       Cervix -closed  Neurological: She is alert and oriented to person, place, and time.  Skin: Skin is warm and dry.   FHR 130's, +accel, reactive Toco - irritability Cervix rechecked > closed MAU Course  Procedures  Results for orders placed during the hospital encounter of 03/19/12 (from the past 24 hour(s))  URINALYSIS, ROUTINE W REFLEX MICROSCOPIC     Status: Normal   Collection Time  03/19/12 10:30 PM      Component Value Range   Color, Urine YELLOW  YELLOW    APPearance CLEAR  CLEAR    Specific Gravity, Urine 1.025  1.005 - 1.030    pH 6.0  5.0 - 8.0    Glucose, UA NEGATIVE  NEGATIVE (mg/dL)   Hgb urine dipstick NEGATIVE  NEGATIVE    Bilirubin Urine NEGATIVE  NEGATIVE    Ketones, ur NEGATIVE  NEGATIVE (mg/dL)   Protein, ur NEGATIVE  NEGATIVE (mg/dL)   Urobilinogen, UA 0.2  0.0 - 1.0 (mg/dL)   Nitrite NEGATIVE  NEGATIVE    Leukocytes, UA NEGATIVE  NEGATIVE      Assessment and Plan  Braxton Hicks  Plan: DC to home Preterm labor precautions Keep scheduled appointment  [facility] 03/19/2012, 11:04 PM

## 2012-03-19 NOTE — MAU Note (Signed)
Patient is in with c/o abdominal cramping, pressure and contractions all day. She denies any vaginal bleeding or lof. She reports good fetal movement.

## 2012-03-20 NOTE — MAU Provider Note (Signed)
Chart reviewed and agree with management and plan.  

## 2012-03-22 ENCOUNTER — Ambulatory Visit (INDEPENDENT_AMBULATORY_CARE_PROVIDER_SITE_OTHER): Payer: PRIVATE HEALTH INSURANCE | Admitting: Obstetrics & Gynecology

## 2012-03-22 DIAGNOSIS — Z3480 Encounter for supervision of other normal pregnancy, unspecified trimester: Secondary | ICD-10-CM

## 2012-03-22 NOTE — Progress Notes (Signed)
Routine check was seen in the hospital this weekend for [facility] that were very strong.  NST today.

## 2012-03-22 NOTE — Progress Notes (Signed)
Routine OB visit. Doing well. Denies VB, ROM. Does report BH ctxs. Reports good FM. She goes to [facility]FM every Friday and gets NST here every Tuesday. She wants to try TOLAC and will sign consent at next visit. She also wants a PPS and says that she signed her PPS forms last week here.

## 2012-03-25 ENCOUNTER — Other Ambulatory Visit (HOSPITAL_COMMUNITY): Payer: Self-pay | Admitting: Maternal and Fetal Medicine

## 2012-03-25 ENCOUNTER — Ambulatory Visit (HOSPITAL_COMMUNITY)
Admission: RE | Admit: 2012-03-25 | Discharge: 2012-03-25 | Disposition: A | Discharge status: Home / Self Care | Payer: PRIVATE HEALTH INSURANCE | Source: Ambulatory Visit | Attending: Obstetrics & Gynecology | Admitting: Obstetrics & Gynecology

## 2012-03-25 VITALS — BP 106/64 | HR 102 | Wt 219.0 lb

## 2012-03-25 DIAGNOSIS — O34219 Maternal care for unspecified type scar from previous cesarean delivery: Secondary | ICD-10-CM | POA: Insufficient documentation

## 2012-03-25 DIAGNOSIS — O09529 Supervision of elderly multigravida, unspecified trimester: Secondary | ICD-10-CM

## 2012-03-25 DIAGNOSIS — O3410 Maternal care for benign tumor of corpus uteri, unspecified trimester: Secondary | ICD-10-CM | POA: Insufficient documentation

## 2012-03-25 DIAGNOSIS — O365990 Maternal care for other known or suspected poor fetal growth, unspecified trimester, not applicable or unspecified: Secondary | ICD-10-CM

## 2012-03-25 DIAGNOSIS — O10919 Unspecified pre-existing hypertension complicating pregnancy, unspecified trimester: Secondary | ICD-10-CM

## 2012-03-25 DIAGNOSIS — O3440 Maternal care for other abnormalities of cervix, unspecified trimester: Secondary | ICD-10-CM | POA: Insufficient documentation

## 2012-03-25 DIAGNOSIS — O358XX0 Maternal care for other (suspected) fetal abnormality and damage, not applicable or unspecified: Secondary | ICD-10-CM | POA: Insufficient documentation

## 2012-03-25 DIAGNOSIS — O10019 Pre-existing essential hypertension complicating pregnancy, unspecified trimester: Secondary | ICD-10-CM | POA: Insufficient documentation

## 2012-03-25 NOTE — Progress Notes (Signed)
Patient seen for follow up BPP.  See report in [facility].  Connie Mills, M

## 2012-03-29 ENCOUNTER — Ambulatory Visit (INDEPENDENT_AMBULATORY_CARE_PROVIDER_SITE_OTHER): Payer: PRIVATE HEALTH INSURANCE | Admitting: Obstetrics & Gynecology

## 2012-03-29 ENCOUNTER — Encounter: Payer: Self-pay | Admitting: Obstetrics & Gynecology

## 2012-03-29 VITALS — BP 122/69 | Temp 98.4°F | Wt 221.0 lb

## 2012-03-29 DIAGNOSIS — Z3480 Encounter for supervision of other normal pregnancy, unspecified trimester: Secondary | ICD-10-CM

## 2012-03-29 DIAGNOSIS — O09529 Supervision of elderly multigravida, unspecified trimester: Secondary | ICD-10-CM

## 2012-03-29 DIAGNOSIS — O34219 Maternal care for unspecified type scar from previous cesarean delivery: Secondary | ICD-10-CM

## 2012-03-29 DIAGNOSIS — O139 Gestational [pregnancy-induced] hypertension without significant proteinuria, unspecified trimester: Secondary | ICD-10-CM

## 2012-03-29 NOTE — Progress Notes (Signed)
Routine visit. No problems. Good FM. NST reactive. TOLAC consent signed today.

## 2012-03-29 NOTE — Progress Notes (Signed)
p-85  Pt to sign TOLAC consent today.

## 2012-04-01 ENCOUNTER — Ambulatory Visit (HOSPITAL_COMMUNITY)
Admission: RE | Admit: 2012-04-01 | Discharge: 2012-04-01 | Disposition: A | Discharge status: Home / Self Care | Payer: PRIVATE HEALTH INSURANCE | Source: Ambulatory Visit | Attending: Obstetrics & Gynecology | Admitting: Obstetrics & Gynecology

## 2012-04-01 VITALS — BP 126/72 | HR 87 | Wt 222.0 lb

## 2012-04-01 DIAGNOSIS — O09529 Supervision of elderly multigravida, unspecified trimester: Secondary | ICD-10-CM

## 2012-04-01 DIAGNOSIS — O34219 Maternal care for unspecified type scar from previous cesarean delivery: Secondary | ICD-10-CM | POA: Insufficient documentation

## 2012-04-01 DIAGNOSIS — O10919 Unspecified pre-existing hypertension complicating pregnancy, unspecified trimester: Secondary | ICD-10-CM

## 2012-04-01 DIAGNOSIS — O10019 Pre-existing essential hypertension complicating pregnancy, unspecified trimester: Secondary | ICD-10-CM | POA: Insufficient documentation

## 2012-04-01 DIAGNOSIS — O365990 Maternal care for other known or suspected poor fetal growth, unspecified trimester, not applicable or unspecified: Secondary | ICD-10-CM | POA: Insufficient documentation

## 2012-04-01 DIAGNOSIS — O3410 Maternal care for benign tumor of corpus uteri, unspecified trimester: Secondary | ICD-10-CM | POA: Insufficient documentation

## 2012-04-05 ENCOUNTER — Ambulatory Visit (INDEPENDENT_AMBULATORY_CARE_PROVIDER_SITE_OTHER): Payer: PRIVATE HEALTH INSURANCE | Admitting: Obstetrics & Gynecology

## 2012-04-05 VITALS — BP 126/71 | Temp 98.0°F | Wt 221.0 lb

## 2012-04-05 DIAGNOSIS — O169 Unspecified maternal hypertension, unspecified trimester: Secondary | ICD-10-CM

## 2012-04-05 DIAGNOSIS — O99019 Anemia complicating pregnancy, unspecified trimester: Secondary | ICD-10-CM

## 2012-04-05 DIAGNOSIS — D649 Anemia, unspecified: Secondary | ICD-10-CM

## 2012-04-05 DIAGNOSIS — Z3480 Encounter for supervision of other normal pregnancy, unspecified trimester: Secondary | ICD-10-CM

## 2012-04-05 NOTE — Progress Notes (Signed)
p-87  Increase in nosebleeds.

## 2012-04-05 NOTE — Progress Notes (Signed)
Continue 2x week testing.  Growth [place] this week.  NST reactive today.  Cultures next week.  No complaints.

## 2012-04-08 ENCOUNTER — Ambulatory Visit (HOSPITAL_COMMUNITY)
Admission: RE | Admit: 2012-04-08 | Discharge: 2012-04-08 | Disposition: A | Discharge status: Home / Self Care | Payer: PRIVATE HEALTH INSURANCE | Source: Ambulatory Visit | Attending: Physician Assistant | Admitting: Physician Assistant

## 2012-04-08 ENCOUNTER — Other Ambulatory Visit (HOSPITAL_COMMUNITY): Payer: Self-pay | Admitting: Maternal and Fetal Medicine

## 2012-04-08 VITALS — BP 132/81 | HR 89 | Wt 223.0 lb

## 2012-04-08 DIAGNOSIS — O365990 Maternal care for other known or suspected poor fetal growth, unspecified trimester, not applicable or unspecified: Secondary | ICD-10-CM | POA: Insufficient documentation

## 2012-04-08 DIAGNOSIS — O09529 Supervision of elderly multigravida, unspecified trimester: Secondary | ICD-10-CM | POA: Insufficient documentation

## 2012-04-08 DIAGNOSIS — O10919 Unspecified pre-existing hypertension complicating pregnancy, unspecified trimester: Secondary | ICD-10-CM

## 2012-04-08 DIAGNOSIS — O34219 Maternal care for unspecified type scar from previous cesarean delivery: Secondary | ICD-10-CM | POA: Insufficient documentation

## 2012-04-08 DIAGNOSIS — O3410 Maternal care for benign tumor of corpus uteri, unspecified trimester: Secondary | ICD-10-CM | POA: Insufficient documentation

## 2012-04-08 DIAGNOSIS — O10019 Pre-existing essential hypertension complicating pregnancy, unspecified trimester: Secondary | ICD-10-CM | POA: Insufficient documentation

## 2012-04-12 ENCOUNTER — Other Ambulatory Visit (HOSPITAL_COMMUNITY): Payer: Self-pay | Admitting: Maternal and Fetal Medicine

## 2012-04-12 ENCOUNTER — Ambulatory Visit (INDEPENDENT_AMBULATORY_CARE_PROVIDER_SITE_OTHER): Payer: PRIVATE HEALTH INSURANCE | Admitting: Obstetrics & Gynecology

## 2012-04-12 ENCOUNTER — Other Ambulatory Visit: Payer: Self-pay | Admitting: Obstetrics & Gynecology

## 2012-04-12 ENCOUNTER — Ambulatory Visit (HOSPITAL_COMMUNITY)
Admission: RE | Admit: 2012-04-12 | Discharge: 2012-04-12 | Disposition: A | Discharge status: Home / Self Care | Payer: PRIVATE HEALTH INSURANCE | Source: Ambulatory Visit | Attending: Obstetrics & Gynecology | Admitting: Obstetrics & Gynecology

## 2012-04-12 VITALS — BP 114/72 | HR 90 | Wt 224.0 lb

## 2012-04-12 DIAGNOSIS — O169 Unspecified maternal hypertension, unspecified trimester: Secondary | ICD-10-CM

## 2012-04-12 DIAGNOSIS — O09529 Supervision of elderly multigravida, unspecified trimester: Secondary | ICD-10-CM

## 2012-04-12 DIAGNOSIS — O10919 Unspecified pre-existing hypertension complicating pregnancy, unspecified trimester: Secondary | ICD-10-CM

## 2012-04-12 DIAGNOSIS — O365990 Maternal care for other known or suspected poor fetal growth, unspecified trimester, not applicable or unspecified: Secondary | ICD-10-CM | POA: Insufficient documentation

## 2012-04-12 DIAGNOSIS — O0990 Supervision of high risk pregnancy, unspecified, unspecified trimester: Secondary | ICD-10-CM

## 2012-04-12 DIAGNOSIS — O34219 Maternal care for unspecified type scar from previous cesarean delivery: Secondary | ICD-10-CM | POA: Insufficient documentation

## 2012-04-12 DIAGNOSIS — O3410 Maternal care for benign tumor of corpus uteri, unspecified trimester: Secondary | ICD-10-CM | POA: Insufficient documentation

## 2012-04-12 DIAGNOSIS — O10019 Pre-existing essential hypertension complicating pregnancy, unspecified trimester: Secondary | ICD-10-CM | POA: Insufficient documentation

## 2012-04-12 NOTE — Progress Notes (Signed)
Patient is here for NST , OB check and cultures.  Was also told she would have blood work to check her iron today.

## 2012-04-12 NOTE — Progress Notes (Signed)
Patient seen for follow up BPP and Doppler studies.  See report in [facility].  Alpha Gula, MD  IUP at 36 5/7  weeks  Normal amniotic fluid volume  BPP 8/8 Normal umbilical artery Doppler studies without evidence of absent or reversed diastolic flow   Recommend twice weekly BPP's and dopplers.

## 2012-04-12 NOTE — Progress Notes (Signed)
Dopplers high nml on the 24th.  Do not have report from today but pt told everything was OK.  Continue BPPs 2x week with dopplers.  BP nml today.  No signs of preeclampsia.  CBC, cultures today.

## 2012-04-13 ENCOUNTER — Telehealth: Payer: Self-pay | Admitting: *Deleted

## 2012-04-13 ENCOUNTER — Other Ambulatory Visit: Payer: Self-pay | Admitting: Obstetrics & Gynecology

## 2012-04-13 LAB — CBC
HCT: 28.5 % — ABNORMAL LOW (ref 36.0–46.0)
Hemoglobin: 9.2 g/dL — ABNORMAL LOW (ref 12.0–15.0)
MCH: 23.5 pg — ABNORMAL LOW (ref 26.0–34.0)
MCHC: 32.3 g/dL (ref 30.0–36.0)
MCV: 72.9 fL — ABNORMAL LOW (ref 78.0–100.0)
Platelets: 271 10*3/uL (ref 150–400)
RBC: 3.91 MIL/uL (ref 3.87–5.11)
RDW: 16.1 % — ABNORMAL HIGH (ref 11.5–15.5)
WBC: 9.6 10*3/uL (ref 4.0–10.5)

## 2012-04-13 NOTE — Telephone Encounter (Signed)
Pt notified that her HgB was 9.2 and she is currently taking the FE 2x daily every other day.  She is going to start everyday BID and use the Miralax everyday.

## 2012-04-14 LAB — GC/CHLAMYDIA PROBE AMP, GENITAL
Chlamydia, DNA Probe: NEGATIVE
GC Probe Amp, Genital: NEGATIVE

## 2012-04-15 ENCOUNTER — Ambulatory Visit (HOSPITAL_COMMUNITY)
Admission: RE | Admit: 2012-04-15 | Discharge: 2012-04-15 | Disposition: A | Discharge status: Home / Self Care | Payer: PRIVATE HEALTH INSURANCE | Source: Ambulatory Visit | Attending: Obstetrics & Gynecology | Admitting: Obstetrics & Gynecology

## 2012-04-15 VITALS — BP 130/74 | HR 86 | Wt 224.0 lb

## 2012-04-15 DIAGNOSIS — O34219 Maternal care for unspecified type scar from previous cesarean delivery: Secondary | ICD-10-CM | POA: Insufficient documentation

## 2012-04-15 DIAGNOSIS — O09529 Supervision of elderly multigravida, unspecified trimester: Secondary | ICD-10-CM | POA: Insufficient documentation

## 2012-04-15 DIAGNOSIS — O10019 Pre-existing essential hypertension complicating pregnancy, unspecified trimester: Secondary | ICD-10-CM | POA: Insufficient documentation

## 2012-04-15 DIAGNOSIS — O3410 Maternal care for benign tumor of corpus uteri, unspecified trimester: Secondary | ICD-10-CM | POA: Insufficient documentation

## 2012-04-15 DIAGNOSIS — O365990 Maternal care for other known or suspected poor fetal growth, unspecified trimester, not applicable or unspecified: Secondary | ICD-10-CM

## 2012-04-15 DIAGNOSIS — O10919 Unspecified pre-existing hypertension complicating pregnancy, unspecified trimester: Secondary | ICD-10-CM

## 2012-04-15 NOTE — Progress Notes (Signed)
Addended by: Lesly Dukes on: 04/15/2012 08:13 AM   Modules accepted: Orders

## 2012-04-15 NOTE — Progress Notes (Signed)
Patient seen for follow up BPP and Doppler studies.  See report in [facility].  Alpha Gula, MD  IUP at 37 17  weeks  Normal amniotic fluid volume  BPP 8/8 Normal umbilical artery Doppler studies without evidence of absent or reversed diastolic flow  Continue 2x weekly testing.  Plan BPP/ Dopplers and growth scan next week.  If IUGR noted, would recommend delivery at that time.

## 2012-04-16 LAB — OB RESULTS CONSOLE GBS: GBS: NEGATIVE

## 2012-04-16 LAB — CULTURE, BETA STREP (GROUP B ONLY)

## 2012-04-19 ENCOUNTER — Ambulatory Visit (INDEPENDENT_AMBULATORY_CARE_PROVIDER_SITE_OTHER): Payer: PRIVATE HEALTH INSURANCE | Admitting: Obstetrics & Gynecology

## 2012-04-19 ENCOUNTER — Encounter: Payer: Self-pay | Admitting: Obstetrics & Gynecology

## 2012-04-19 VITALS — BP 141/75 | Temp 98.6°F | Wt 225.0 lb

## 2012-04-19 DIAGNOSIS — O10919 Unspecified pre-existing hypertension complicating pregnancy, unspecified trimester: Secondary | ICD-10-CM

## 2012-04-19 DIAGNOSIS — O10019 Pre-existing essential hypertension complicating pregnancy, unspecified trimester: Secondary | ICD-10-CM

## 2012-04-19 DIAGNOSIS — Z3480 Encounter for supervision of other normal pregnancy, unspecified trimester: Secondary | ICD-10-CM

## 2012-04-19 DIAGNOSIS — O09529 Supervision of elderly multigravida, unspecified trimester: Secondary | ICD-10-CM

## 2012-04-19 DIAGNOSIS — O365990 Maternal care for other known or suspected poor fetal growth, unspecified trimester, not applicable or unspecified: Secondary | ICD-10-CM

## 2012-04-19 NOTE — Progress Notes (Signed)
Routine visit. No complaints. Good FM. NST reactive today. Labor precautions reviewed. No s/sx of preeclampsia. I have scheduled for IOL at 39 weeks. I have discussed that an induction with an unfavorable cervix may take up to 3 days.

## 2012-04-19 NOTE — Progress Notes (Signed)
p-86  Increase in contractions.  IOL scheduled for 04/28/12 @ 6:30 am per Dr Marice Potter

## 2012-04-20 ENCOUNTER — Telehealth (HOSPITAL_COMMUNITY): Payer: Self-pay | Admitting: *Deleted

## 2012-04-21 ENCOUNTER — Encounter (HOSPITAL_COMMUNITY): Payer: Self-pay | Admitting: *Deleted

## 2012-04-21 ENCOUNTER — Telehealth (HOSPITAL_COMMUNITY): Payer: Self-pay | Admitting: *Deleted

## 2012-04-21 ENCOUNTER — Ambulatory Visit (HOSPITAL_COMMUNITY)
Admission: RE | Admit: 2012-04-21 | Discharge: 2012-04-21 | Disposition: A | Discharge status: Home / Self Care | Payer: PRIVATE HEALTH INSURANCE | Source: Ambulatory Visit | Attending: Obstetrics & Gynecology | Admitting: Obstetrics & Gynecology

## 2012-04-21 VITALS — BP 137/79 | HR 96 | Wt 225.0 lb

## 2012-04-21 DIAGNOSIS — O34219 Maternal care for unspecified type scar from previous cesarean delivery: Secondary | ICD-10-CM | POA: Insufficient documentation

## 2012-04-21 DIAGNOSIS — O3410 Maternal care for benign tumor of corpus uteri, unspecified trimester: Secondary | ICD-10-CM | POA: Insufficient documentation

## 2012-04-21 DIAGNOSIS — O09529 Supervision of elderly multigravida, unspecified trimester: Secondary | ICD-10-CM | POA: Insufficient documentation

## 2012-04-21 DIAGNOSIS — O365990 Maternal care for other known or suspected poor fetal growth, unspecified trimester, not applicable or unspecified: Secondary | ICD-10-CM | POA: Insufficient documentation

## 2012-04-21 DIAGNOSIS — O10919 Unspecified pre-existing hypertension complicating pregnancy, unspecified trimester: Secondary | ICD-10-CM

## 2012-04-21 DIAGNOSIS — O10019 Pre-existing essential hypertension complicating pregnancy, unspecified trimester: Secondary | ICD-10-CM | POA: Insufficient documentation

## 2012-04-21 DIAGNOSIS — O358XX0 Maternal care for other (suspected) fetal abnormality and damage, not applicable or unspecified: Secondary | ICD-10-CM | POA: Insufficient documentation

## 2012-04-21 DIAGNOSIS — O3440 Maternal care for other abnormalities of cervix, unspecified trimester: Secondary | ICD-10-CM | POA: Insufficient documentation

## 2012-04-21 NOTE — ED Notes (Signed)
Pt states some pelvic pressure today.  +FM.  Denies any other problems.

## 2012-04-21 NOTE — Telephone Encounter (Signed)
Preadmission screen  

## 2012-04-26 ENCOUNTER — Ambulatory Visit (INDEPENDENT_AMBULATORY_CARE_PROVIDER_SITE_OTHER): Payer: PRIVATE HEALTH INSURANCE | Admitting: Obstetrics & Gynecology

## 2012-04-26 ENCOUNTER — Encounter (HOSPITAL_COMMUNITY): Payer: Self-pay | Admitting: *Deleted

## 2012-04-26 ENCOUNTER — Encounter: Payer: Self-pay | Admitting: Obstetrics & Gynecology

## 2012-04-26 ENCOUNTER — Inpatient Hospital Stay (HOSPITAL_COMMUNITY)
Admission: AD | Admit: 2012-04-26 | Discharge: 2012-04-26 | Disposition: A | Discharge status: Home / Self Care | Payer: PRIVATE HEALTH INSURANCE | Source: Ambulatory Visit | Attending: Family Medicine | Admitting: Family Medicine

## 2012-04-26 VITALS — BP 131/76 | Temp 98.6°F | Wt 224.0 lb

## 2012-04-26 DIAGNOSIS — O169 Unspecified maternal hypertension, unspecified trimester: Secondary | ICD-10-CM

## 2012-04-26 DIAGNOSIS — O09529 Supervision of elderly multigravida, unspecified trimester: Secondary | ICD-10-CM

## 2012-04-26 DIAGNOSIS — O368390 Maternal care for abnormalities of the fetal heart rate or rhythm, unspecified trimester, not applicable or unspecified: Secondary | ICD-10-CM | POA: Insufficient documentation

## 2012-04-26 DIAGNOSIS — Z3689 Encounter for other specified antenatal screening: Secondary | ICD-10-CM

## 2012-04-26 DIAGNOSIS — O10919 Unspecified pre-existing hypertension complicating pregnancy, unspecified trimester: Secondary | ICD-10-CM

## 2012-04-26 DIAGNOSIS — Z3480 Encounter for supervision of other normal pregnancy, unspecified trimester: Secondary | ICD-10-CM

## 2012-04-26 DIAGNOSIS — O10019 Pre-existing essential hypertension complicating pregnancy, unspecified trimester: Secondary | ICD-10-CM

## 2012-04-26 DIAGNOSIS — Z98891 History of uterine scar from previous surgery: Secondary | ICD-10-CM

## 2012-04-26 HISTORY — DX: Encounter for other specified aftercare: Z51.89

## 2012-04-26 NOTE — Progress Notes (Signed)
Routine visit. No complaints. NST- NR with 1 late decel. Cervix unchanged. She will go to MAU for further fetal testing.

## 2012-04-26 NOTE — Discharge Instructions (Signed)
Labor Induction  Most women go into labor on their own between 37 and 42 weeks of the pregnancy. When this does not happen or when there is a medical need, medicine or other methods may be used to induce labor. Labor induction causes a pregnant woman's uterus to contract. It also causes the cervix to soften (ripen), open (dilate), and thin out (efface). Usually, labor is not induced before 39 weeks of the pregnancy unless there is a problem with the baby or mother. Whether your labor will be induced depends on a number of factors, including the following:  The medical condition of you and the baby.   How many weeks along you are.   The status of baby's lung maturity.   The condition of the cervix.   The position of the baby.  REASONS FOR LABOR INDUCTION  The health of the baby or mother is at risk.   The pregnancy is overdue by 1 week or more.   The water breaks but labor does not start on its own.   The mother has a health condition or serious illness such as high blood pressure, infection, placental abruption, or diabetes.   The amniotic fluid amounts are low around the baby.   The baby is distressed.  REASONS TO NOT INDUCE LABOR Labor induction may not be a good idea if:  It is shown that your baby does not tolerate labor.   An induction is just more convenient.   You want the baby to be born on a certain date, like a holiday.   You have had previous surgeries on your uterus, such as a myomectomy or the removal of fibroids.   Your placenta lies very low in the uterus and blocks the opening of the cervix (placenta previa).   Your baby is not in a head down position.   The umbilical cord drops down into the birth canal in front of the baby. This could cut off the baby's blood and oxygen supply.   You have had a previous cesarean delivery.   There areunusual circumstances, such as the baby being extremely premature.  RISKS AND COMPLICATIONS Problems may occur in the  process of induction and plans may need to be modified as a situation unfolds. Some of the risks of induction include:  Change in fetal heart rate, such as too high, too low, or erratic.   Risk of fetal distress.   Risk of infection to mother and baby.   Increased chance of having a cesarean delivery.   The rare, but increased chance that the placenta will separate from the uterus (abruption).   Uterine rupture (very rare).  When induction is needed for medical reasons, the benefits of induction may outweigh the risks. BEFORE THE PROCEDURE Your caregiver will check your cervix and the baby's position. This will help your caregiver decide if you are far enough along for an induction to work. PROCEDURE Several methods of labor induction may be used, such as:   Taking prostaglandin medicine to dilate and ripen the cervix. The medicine will also start contractions. It can be taken by mouth or by inserting a suppository into the vagina.   A thin tube (catheter) with a balloon on the end may be inserted into your vagina to dilate the cervix. Once inserted, the balloon expands with water, which causes the cervix to open.   Striping the membranes. Your caregiver inserts a finger between the cervix and membranes, which causes the cervix to be stretched and   may cause the uterus to contract. This is often done during an office visit. You will be sent home to wait for the contractions to begin. You will then come in for an induction.   Breaking the water. Your caregiver will make a hole in the amniotic sac using a small instrument. Once the amniotic sac breaks, contractions should begin. This may still take hours to see an effect.   Taking medicine to trigger or strengthen contractions. This medicine is given intravenously through a tube in your arm.  All of the methods of induction, besides stripping the membranes, will be done in the hospital. Induction is done in the hospital so that you and the  baby can be carefully monitored. AFTER THE PROCEDURE Some inductions can take up to 2 or 3 days. Depending on the cervix, it usually takes less time. It takes longer when you are induced early in the pregnancy or if this is your first pregnancy. If a mother is still pregnant and the induction has been going on for 2 to 3 days, either the mother will be sent home or a cesarean delivery will be needed. Document Released: 03/24/2007 Document Revised: 10/22/2011 Document Reviewed: 09/07/2011 [facility] Patient Information 2012 [place], [place]. Preterm Labor Preterm labor is when labor starts at less than 37 weeks of pregnancy. The normal length of a pregnancy is 39 to 41 weeks. CAUSES Often, there is no identifiable underlying cause as to why a woman goes into preterm labor. However, one of the most common known causes of preterm labor is infection. Infections of the uterus, cervix, vagina, amniotic sac, bladder, kidney, or even the lungs (pneumonia) can cause labor to start. Other causes of preterm labor include:  Urogenital infections, such as yeast infections and bacterial vaginosis.   Uterine abnormalities (uterine shape, uterine septum, fibroids, bleeding from the placenta).   A cervix that has been operated on and opens prematurely.   Malformations in the baby.   Multiple gestations (twins, triplets, and so on).   Breakage of the amniotic sac.  Additional risk factors for preterm labor include:  Previous history of preterm labor.   Premature rupture of membranes (PROM).   A placenta that covers the opening of the cervix (placenta previa).   A placenta that separates from the uterus (placenta abruption).   A cervix that is too weak to hold the baby in the uterus (incompetence cervix).   Having too much fluid in the amniotic sac (polyhydramnios).   Taking illegal drugs or smoking while pregnant.   Not gaining enough weight while pregnant.   Women younger than 46 and older than 38  years old.   Low socioeconomic status.   African-American ethnicity.  SYMPTOMS Signs and symptoms of preterm labor include:  Menstrual-like cramps.   Contractions that are 30 to 70 seconds apart, become very regular, closer together, and are more intense and painful.   Contractions that start on the top of the uterus and spread down to the lower abdomen and back.   A sense of increased pelvic pressure or back pain.   A watery or bloody discharge that comes from the vagina.  DIAGNOSIS  A diagnosis can be confirmed by:  A vaginal exam.   An ultrasound of the cervix.   Sampling (swabbing) cervico-vaginal secretions. These samples can be tested for the presence of fetal fibronectin. This is a protein found in cervical discharge which is associated with preterm labor.   Fetal monitoring.  TREATMENT  Depending on the length  of the pregnancy and other circumstances, a caregiver may suggest bed rest. If necessary, there are medicines that can be given to stop contractions and to quicken fetal lung maturity. If labor happens before 34 weeks of pregnancy, a prolonged hospital stay may be recommended. Treatment depends on the condition of both the mother and baby. PREVENTION There are some things a mother can do to lower the risk of preterm labor in future pregnancies. A woman can:   Stop smoking.   Maintain healthy weight gain and avoid chemicals and drugs that are not necessary.   Be watchful for any type of infection.   Inform her caregiver if she has a known history of preterm labor.  Document Released: 01/23/2004 Document Revised: 10/22/2011 Document Reviewed: 02/27/2011 [facility] Patient Information 2012 [place], [place].

## 2012-04-26 NOTE — OB Triage Provider Note (Addendum)
History     CSN: [id number]  Arrival date and time: 04/26/12 1638   None     Chief Complaint  Patient presents with  . non reactive fetal tracing.    HPI This is a 36 year old G3 P1 011 at 38 weeks and 5 days who presents to labor and delivery for extended moderate due to visible deceleration in the office. Pregnancy complicated by chronic hypertension in pregnancy, intrauterine growth restriction, echogenic focus of fetal heart, and a prior C-section. She reports feeling well today and is without complaints. She denies contractions, vaginal bleeding, vaginal discharge, decreased fetal activity, headache, vision changes, scotomas, abdominal pain, peripheral edema. She last felt fetal movement within the past hour. Patient had a recent modified BPP this past Friday with normal AFI.  OB History    Grav Para Term Preterm Abortions TAB SAB Ect Mult Living   3 1 1  1  1   1       Past Medical History  Diagnosis Date  . Blood transfusion complicating pregnancy 1993  . Abnormal Pap smear of cervix 2005    Leep; normal pap after  . Anemia   . Hypertension     labetolol 200 bid  . AMA (advanced maternal age) multigravida 35+     Past Surgical History  Procedure Date  . Wisdom tooth extraction 2003  . Cervical biopsy  w/ loop electrode excision   . Cesarean section 1993     Breech, blood loss transfusion with "busted blood vessels behind incision"    Family History  Problem Relation Age of Onset  . Hypertension Mother   . Hypertension Brother   . Diabetes Maternal Grandmother   . Cancer Paternal Grandfather   . Hypertension Sister   . Thyroid disease Sister   . Thyroid disease Maternal Aunt     History  Substance Use Topics  . Smoking status: Never Smoker   . Smokeless tobacco: Never Used  . Alcohol Use: No    Allergies:  Allergies  Allergen Reactions  . Dilaudid (Hydromorphone Hcl) Itching    Prescriptions prior to admission  Medication Sig Dispense Refill  .  acetaminophen (TYLENOL) 500 MG tablet Take 500 mg by mouth every 6 (six) hours as needed. For pain       . diphenhydrAMINE (BENADRYL) 25 MG tablet Take 25 mg by mouth every 6 (six) hours as needed.      . ferrous sulfate 325 (65 FE) MG tablet Take 325 mg by mouth daily.      Marland Kitchen labetalol (NORMODYNE) 200 MG tablet Take 200 mg by mouth 2 (two) times daily.      . ondansetron (ZOFRAN) 8 MG tablet Take 8 mg by mouth every 8 (eight) hours as needed.      . polyethylene glycol powder (GLYCOLAX/MIRALAX) powder Take 17 g by mouth daily.      . Prenatal Vit-Fe Fumarate-FA (PRENATAL MULTIVITAMIN) TABS Take 1 tablet by mouth daily.        ROS Physical Exam   Blood pressure 136/85, pulse 78, temperature 98.1 F (36.7 C), temperature source Oral, resp. rate 20, height 5\' 7"  (1.702 m), weight 101.606 kg (224 lb), last menstrual period 06/16/2011.  Physical Exam  Constitutional: She appears well-developed and well-nourished.  GI: Soft. She exhibits no distension and no mass. There is no tenderness. There is no rebound and no guarding.  Musculoskeletal: She exhibits no edema.  Neurological: She is alert.  Skin: Skin is warm and dry.  Psychiatric: She has  a normal mood and affect. Her behavior is normal. Judgment and thought content normal.    MAU Course  Procedures NST: Category 1 tracing with baseline of 120s. Multiple accelerations seen. Isolated contraction with no decelerations.  MDM No evidence of decelerations. Fetal heart tracing is reassuring.  Assessment and Plan  #1 reassuring fetal heart tracing #2 Chronic HTN in pregnancy #3 IUGR  Patient discharged to home. Discussed the patient to followup in having decreased fetal activity. The patient scheduled for induction in 2 days due to chronic hypertension in pregnancy.  Gazelle Towe JEHIEL 04/26/2012, 5:47 PM

## 2012-04-26 NOTE — Progress Notes (Signed)
Patient vital signs obtained, discharge instructions given, and pain assessed.  Patient verbalizes understanding of discharge instructions and plan of care.  Transported to MAU exit via wheelchair and patient left facility in privately owned vehicle.

## 2012-04-26 NOTE — MAU Note (Addendum)
CHTN - drop noted if FH during NST, sent for further eval and monitoring. Possible induction. Denies pain, feeling a lot of pelvic pressure.

## 2012-04-26 NOTE — Progress Notes (Signed)
p-84  Sched. For IOL on Thursday

## 2012-04-28 ENCOUNTER — Encounter (HOSPITAL_COMMUNITY): Payer: Self-pay

## 2012-04-28 ENCOUNTER — Inpatient Hospital Stay (HOSPITAL_COMMUNITY)
Admission: RE | Admit: 2012-04-28 | Discharge: 2012-05-01 | DRG: 774 | Disposition: A | Discharge status: Home / Self Care | Payer: PRIVATE HEALTH INSURANCE | Source: Ambulatory Visit | Attending: Obstetrics & Gynecology | Admitting: Obstetrics & Gynecology

## 2012-04-28 VITALS — BP 167/94 | HR 79 | Temp 98.5°F | Resp 20 | Ht 67.0 in | Wt 224.0 lb

## 2012-04-28 DIAGNOSIS — D649 Anemia, unspecified: Secondary | ICD-10-CM

## 2012-04-28 DIAGNOSIS — O358XX0 Maternal care for other (suspected) fetal abnormality and damage, not applicable or unspecified: Secondary | ICD-10-CM

## 2012-04-28 DIAGNOSIS — O10919 Unspecified pre-existing hypertension complicating pregnancy, unspecified trimester: Secondary | ICD-10-CM

## 2012-04-28 DIAGNOSIS — O365990 Maternal care for other known or suspected poor fetal growth, unspecified trimester, not applicable or unspecified: Secondary | ICD-10-CM

## 2012-04-28 DIAGNOSIS — O1002 Pre-existing essential hypertension complicating childbirth: Principal | ICD-10-CM | POA: Diagnosis present

## 2012-04-28 DIAGNOSIS — O09529 Supervision of elderly multigravida, unspecified trimester: Secondary | ICD-10-CM | POA: Diagnosis present

## 2012-04-28 DIAGNOSIS — O34219 Maternal care for unspecified type scar from previous cesarean delivery: Secondary | ICD-10-CM | POA: Diagnosis present

## 2012-04-28 DIAGNOSIS — O35BXX0 Maternal care for other (suspected) fetal abnormality and damage, fetal cardiac anomalies, not applicable or unspecified: Secondary | ICD-10-CM

## 2012-04-28 LAB — CBC
HCT: 29.3 % — ABNORMAL LOW (ref 36.0–46.0)
Hemoglobin: 9.4 g/dL — ABNORMAL LOW (ref 12.0–15.0)
MCH: 23.6 pg — ABNORMAL LOW (ref 26.0–34.0)
MCHC: 32.1 g/dL (ref 30.0–36.0)
MCV: 73.6 fL — ABNORMAL LOW (ref 78.0–100.0)
Platelets: 266 10*3/uL (ref 150–400)
RBC: 3.98 MIL/uL (ref 3.87–5.11)
RDW: 14.7 % (ref 11.5–15.5)
WBC: 8.9 10*3/uL (ref 4.0–10.5)

## 2012-04-28 LAB — RPR: RPR Ser Ql: NONREACTIVE

## 2012-04-28 MED ORDER — OXYTOCIN 20 UNITS IN LACTATED RINGERS INFUSION - SIMPLE
1.0000 m[IU]/min | INTRAVENOUS | Status: DC
  Administered 2012-04-28: 2 m[IU]/min via INTRAVENOUS
  Administered 2012-04-29: 41.667 m[IU]/min via INTRAVENOUS

## 2012-04-28 MED ORDER — LACTATED RINGERS IV SOLN
INTRAVENOUS | Status: DC
  Administered 2012-04-28: via INTRAVENOUS
  Administered 2012-04-29: 125 mL/h via INTRAVENOUS

## 2012-04-28 MED ORDER — NALBUPHINE SYRINGE 5 MG/0.5 ML
5.0000 mg | INJECTION | INTRAMUSCULAR | Status: DC | PRN
  Filled 2012-04-28 (×2): qty 0.5

## 2012-04-28 MED ORDER — FENTANYL 2.5 MCG/ML BUPIVACAINE 1/10 % EPIDURAL INFUSION (WH - ANES)
14.0000 mL/h | INTRAMUSCULAR | Status: DC
  Administered 2012-04-29 (×3): 14 mL/h via EPIDURAL
  Filled 2012-04-28 (×3): qty 60

## 2012-04-28 MED ORDER — TERBUTALINE SULFATE 1 MG/ML IJ SOLN: 0.2500 mg | Freq: Once | INTRAMUSCULAR | Status: AC | PRN

## 2012-04-28 MED ORDER — LIDOCAINE HCL (PF) 1 % IJ SOLN
30.0000 mL | INTRAMUSCULAR | Status: DC | PRN
  Filled 2012-04-28 (×2): qty 30

## 2012-04-28 MED ORDER — PHENYLEPHRINE 40 MCG/ML (10ML) SYRINGE FOR IV PUSH (FOR BLOOD PRESSURE SUPPORT)
80.0000 ug | PREFILLED_SYRINGE | INTRAVENOUS | Status: DC | PRN
  Filled 2012-04-28: qty 5
  Filled 2012-04-28: qty 2

## 2012-04-28 MED ORDER — OXYCODONE-ACETAMINOPHEN 5-325 MG PO TABS: 1.0000 | ORAL_TABLET | ORAL | Status: DC | PRN

## 2012-04-28 MED ORDER — EPHEDRINE 5 MG/ML INJ
10.0000 mg | INTRAVENOUS | Status: DC | PRN
  Filled 2012-04-28: qty 2
  Filled 2012-04-28: qty 4

## 2012-04-28 MED ORDER — OXYTOCIN 20 UNITS IN LACTATED RINGERS INFUSION - SIMPLE
125.0000 mL/h | Freq: Once | INTRAVENOUS | Status: AC
  Administered 2012-04-29: 125 mL/h via INTRAVENOUS

## 2012-04-28 MED ORDER — PHENYLEPHRINE 40 MCG/ML (10ML) SYRINGE FOR IV PUSH (FOR BLOOD PRESSURE SUPPORT)
80.0000 ug | PREFILLED_SYRINGE | INTRAVENOUS | Status: DC | PRN
  Filled 2012-04-28: qty 2

## 2012-04-28 MED ORDER — EPHEDRINE 5 MG/ML INJ
10.0000 mg | INTRAVENOUS | Status: DC | PRN
  Filled 2012-04-28: qty 2

## 2012-04-28 MED ORDER — LACTATED RINGERS IV SOLN: 500.0000 mL | INTRAVENOUS | Status: DC | PRN

## 2012-04-28 MED ORDER — ONDANSETRON HCL 4 MG/2ML IJ SOLN
4.0000 mg | Freq: Four times a day (QID) | INTRAMUSCULAR | Status: DC | PRN
  Administered 2012-04-29 (×2): 4 mg via INTRAVENOUS
  Filled 2012-04-28 (×2): qty 2

## 2012-04-28 MED ORDER — ACETAMINOPHEN 325 MG PO TABS: 650.0000 mg | ORAL_TABLET | ORAL | Status: DC | PRN

## 2012-04-28 MED ORDER — NALBUPHINE SYRINGE 5 MG/0.5 ML
10.0000 mg | INJECTION | INTRAMUSCULAR | Status: DC | PRN
  Administered 2012-04-28 – 2012-04-29 (×2): 10 mg via INTRAVENOUS
  Filled 2012-04-28 (×2): qty 1
  Filled 2012-04-28: qty 0.5

## 2012-04-28 MED ORDER — LACTATED RINGERS IV SOLN: 500.0000 mL | Freq: Once | INTRAVENOUS | Status: DC

## 2012-04-28 MED ORDER — OXYTOCIN BOLUS FROM INFUSION
500.0000 mL | Freq: Once | INTRAVENOUS | Status: DC
  Filled 2012-04-28 (×2): qty 1000
  Filled 2012-04-28: qty 500

## 2012-04-28 MED ORDER — DIPHENHYDRAMINE HCL 50 MG/ML IJ SOLN: 12.5000 mg | INTRAMUSCULAR | Status: DC | PRN

## 2012-04-28 MED ORDER — NALBUPHINE SYRINGE 5 MG/0.5 ML: 5.0000 mg | INJECTION | INTRAMUSCULAR | Status: DC | PRN

## 2012-04-28 MED ORDER — IBUPROFEN 600 MG PO TABS
600.0000 mg | ORAL_TABLET | Freq: Four times a day (QID) | ORAL | Status: DC | PRN
  Administered 2012-04-29: 600 mg via ORAL
  Filled 2012-04-28 (×6): qty 1

## 2012-04-28 MED ORDER — CITRIC ACID-SODIUM CITRATE 334-500 MG/5ML PO SOLN: 30.0000 mL | ORAL | Status: DC | PRN

## 2012-04-28 MED ORDER — LABETALOL HCL 200 MG PO TABS
200.0000 mg | ORAL_TABLET | Freq: Two times a day (BID) | ORAL | Status: DC
  Administered 2012-04-28 – 2012-05-01 (×6): 200 mg via ORAL
  Filled 2012-04-28 (×9): qty 1

## 2012-04-28 MED ORDER — FLEET ENEMA 7-19 GM/118ML RE ENEM: 1.0000 | ENEMA | RECTAL | Status: DC | PRN

## 2012-04-28 NOTE — H&P (Signed)
Connie Mills is a 36 y.o. female at [redacted]w[redacted]d by LMP and concordant first trimester ultrasound presenting for induction of labor for hypertension in pregnancy.    Her pregnancy has been remarkable for a report in a single note of IUGR, however her last ultrasound had a fetal weight in the 20th percentile, hypertension in pregnancy treated with labetalol, anemia on iron supplementation, and GBS negative status.  Her baby was noted to have a cardiac echogenic focus which was not seen on subsequent studies.  She has had a prior c-section and has elected TOLAC.  Last felt baby move 10 minutes ago, baby has been moving normally.  No vaginal bleeding or contractions or rush of fluid.  Maternal Medical History:  Reason for admission: Reason for Admission:   nauseaPrenatal complications: Bleeding (Had bleeding at 29 weeks, was told it was secondary to being sexually active.), hypertension and infection (Yeast infection).   No cholelithiasis, HIV, nephrolithiasis, oligohydramnios, placental abnormality, polyhydramnios, pre-eclampsia, preterm labor, substance abuse, thrombocytopenia or thrombophilia.     OB History    Grav Para Term Preterm Abortions TAB SAB Ect Mult Living   3 1 1  1  0 1 0 0 1     Past Medical History  Diagnosis Date  . Blood transfusion complicating pregnancy 1993  . Abnormal Pap smear of cervix 2005    Leep; normal pap after  . Anemia   . Hypertension     labetolol 200 bid  . AMA (advanced maternal age) multigravida 35+   . Blood transfusion May 16, 1992    "busted blood vessel behind incision", led to bleeding.   Past Surgical History  Procedure Date  . Wisdom tooth extraction 2003  . Cervical biopsy  w/ loop electrode excision   . Cesarean section 1993     Breech, blood loss transfusion with "busted blood vessels behind incision"   Family History: family history includes Cancer in her paternal grandfather; Diabetes in her maternal grandmother; Hypertension in her  brother, mother, and sister; and Thyroid disease in her maternal aunt and sister.  There is no history of Other. Social History:  reports that she has never smoked. She has never used smokeless tobacco. She reports that she does not drink alcohol or use illicit drugs.  Review of Systems  Constitutional: Negative for fever, chills and diaphoresis.  HENT: Negative for congestion, sore throat and neck pain.   Eyes: Negative for blurred vision, double vision and pain.  Respiratory: Negative for cough, shortness of breath and wheezing.   Cardiovascular: Negative for chest pain and palpitations.  Gastrointestinal: Positive for heartburn and constipation (Has been taking miralax). Negative for nausea, vomiting, abdominal pain and diarrhea.  Genitourinary: Negative for dysuria.  Musculoskeletal: Negative for myalgias, back pain, joint pain and falls.  Skin: Negative for itching and rash.  Neurological: Negative for dizziness, speech change, focal weakness, seizures, loss of consciousness and headaches.  Endo/Heme/Allergies: Does not bruise/bleed easily.  Psychiatric/Behavioral: Negative for depression. The patient is not nervous/anxious.       Blood pressure 133/70, pulse 89, temperature 98.3 F (36.8 C), temperature source Oral, resp. rate 18, height 5\' 7"  (1.702 m), weight 101.606 kg (224 lb), last menstrual period 06/16/2011, unknown if currently breastfeeding. Maternal Exam:  Abdomen: Patient reports no abdominal tenderness. Fundal height is Appropriate for gestational age.   Fetal presentation: vertex  Introitus: Normal vulva. Normal vagina.  Ferning test: not done.  Nitrazine test: not done. Amniotic fluid character: not assessed.  Pelvis: adequate for delivery.  Cervix: Cervix evaluated by digital exam.   Closed/thick/posterior/vertex/-2  Fetal Exam Fetal Monitor Review: Baseline rate: 145.  Variability: moderate (6-25 bpm).   Pattern: accelerations present and variable  decelerations.      Variable decels are occasional and verymild.  Physical Exam  Constitutional: She is oriented to person, place, and time. She appears well-developed and well-nourished.  HENT:  Head: Normocephalic and atraumatic.  Eyes: Conjunctivae are normal. Right eye exhibits no discharge. Left eye exhibits no discharge. No scleral icterus.  Neck: No tracheal deviation present.  Cardiovascular: Normal rate, regular rhythm and normal heart sounds.   Respiratory: Effort normal and breath sounds normal. No stridor. No respiratory distress.  GI: Soft. She exhibits no distension. There is no rebound and no guarding.       Gravid, appropriate for gestational age  Musculoskeletal: She exhibits no edema.  Neurological: She is alert and oriented to person, place, and time. Coordination normal.  Skin: Skin is warm and dry.  Psychiatric: She has a normal mood and affect. Her behavior is normal. Judgment and thought content normal.    Prenatal labs: ABO, Rh: B/POS/-- (02/11 1037) Antibody: NEG (02/11 1037) Rubella: 116.0 (02/11 1037) RPR: NON REAC (03/12 1045)  HBsAg: NEGATIVE (02/11 1037)  HIV: NON REACTIVE (03/12 1045)  GBS: Negative (06/01 0000)   Assessment/Plan: 36 year old s/p prior c-section presents for induction of labor for hypertension in pregnancy on labetalol.  Physical exam is remarkable for a cervix which is closed, thick, posterior.   Labs are remarkable for GBS negative, Rh positive  Will discuss induction strategy with fellow.  Clancy Gourd 04/28/2012, 9:07 AM  Patient seen and examined.  Agree with above note.  Levie Heritage, DO 04/28/2012 3:09 PM

## 2012-04-28 NOTE — Progress Notes (Signed)
  Subjective: Patient doing okay.  Very hungry.  Objective: BP 145/83  Pulse 63  Temp 98.1 F (36.7 C) (Oral)  Resp 18  Ht 5\' 7"  (1.702 m)  Wt 101.606 kg (224 lb)  BMI 35.08 kg/m2  LMP 06/16/2011  Breastfeeding? Unknown      FHT:  FHR: 135 bpm, variability: moderate,  accelerations:  Present,  decelerations:  Absent UC:   regular, every 2-3 minutes SVE:   Dilation: Closed Effacement (%): 60 Station: -3 Exam by:: Dr. Adrian Blackwater   Labs: Lab Results  Component Value Date   WBC 8.9 04/28/2012   HGB 9.4* 04/28/2012   HCT 29.3* 04/28/2012   MCV 73.6* 04/28/2012   PLT 266 04/28/2012    Assessment / Plan: Category 1 tracing.  No cervical change.  Will hold pitocin for 3-4 hours and allow patient to eat.  Restart pitocin at midnight.  Connie Mills JEHIEL 04/28/2012, 9:26 PM

## 2012-04-29 ENCOUNTER — Encounter (HOSPITAL_COMMUNITY): Payer: Self-pay | Admitting: Anesthesiology

## 2012-04-29 ENCOUNTER — Encounter (HOSPITAL_COMMUNITY): Payer: Self-pay

## 2012-04-29 ENCOUNTER — Inpatient Hospital Stay (HOSPITAL_COMMUNITY): Payer: PRIVATE HEALTH INSURANCE | Admitting: Anesthesiology

## 2012-04-29 DIAGNOSIS — O09529 Supervision of elderly multigravida, unspecified trimester: Secondary | ICD-10-CM

## 2012-04-29 DIAGNOSIS — O1002 Pre-existing essential hypertension complicating childbirth: Secondary | ICD-10-CM

## 2012-04-29 DIAGNOSIS — O34219 Maternal care for unspecified type scar from previous cesarean delivery: Secondary | ICD-10-CM

## 2012-04-29 MED ORDER — ZOLPIDEM TARTRATE 5 MG PO TABS: 5.0000 mg | ORAL_TABLET | Freq: Every evening | ORAL | Status: DC | PRN

## 2012-04-29 MED ORDER — EPHEDRINE 5 MG/ML INJ: 10.0000 mg | INTRAVENOUS | Status: DC | PRN

## 2012-04-29 MED ORDER — PRENATAL MULTIVITAMIN CH
1.0000 | ORAL_TABLET | Freq: Every day | ORAL | Status: DC
  Administered 2012-04-29 – 2012-05-01 (×3): 1 via ORAL
  Filled 2012-04-29 (×3): qty 1

## 2012-04-29 MED ORDER — DIPHENHYDRAMINE HCL 25 MG PO CAPS: 25.0000 mg | ORAL_CAPSULE | Freq: Four times a day (QID) | ORAL | Status: DC | PRN

## 2012-04-29 MED ORDER — DIPHENHYDRAMINE HCL 50 MG/ML IJ SOLN
12.5000 mg | INTRAMUSCULAR | Status: DC | PRN
Start: 2012-04-29 — End: 2012-04-29

## 2012-04-29 MED ORDER — LACTATED RINGERS IV SOLN: 500.0000 mL | Freq: Once | INTRAVENOUS | Status: DC

## 2012-04-29 MED ORDER — IBUPROFEN 600 MG PO TABS
600.0000 mg | ORAL_TABLET | Freq: Four times a day (QID) | ORAL | Status: DC
  Administered 2012-04-29 – 2012-05-01 (×6): 600 mg via ORAL
  Filled 2012-04-29: qty 1

## 2012-04-29 MED ORDER — SIMETHICONE 80 MG PO CHEW: 80.0000 mg | CHEWABLE_TABLET | ORAL | Status: DC | PRN

## 2012-04-29 MED ORDER — ONDANSETRON HCL 4 MG/2ML IJ SOLN: 4.0000 mg | INTRAMUSCULAR | Status: DC | PRN

## 2012-04-29 MED ORDER — PHENYLEPHRINE 40 MCG/ML (10ML) SYRINGE FOR IV PUSH (FOR BLOOD PRESSURE SUPPORT): 80.0000 ug | PREFILLED_SYRINGE | INTRAVENOUS | Status: DC | PRN

## 2012-04-29 MED ORDER — SENNOSIDES-DOCUSATE SODIUM 8.6-50 MG PO TABS
2.0000 | ORAL_TABLET | Freq: Every day | ORAL | Status: DC
  Administered 2012-04-29 – 2012-04-30 (×2): 2 via ORAL

## 2012-04-29 MED ORDER — TETANUS-DIPHTH-ACELL PERTUSSIS 5-2.5-18.5 LF-MCG/0.5 IM SUSP: 0.5000 mL | Freq: Once | INTRAMUSCULAR | Status: DC

## 2012-04-29 MED ORDER — BENZOCAINE-MENTHOL 20-0.5 % EX AERO: 1.0000 "application " | INHALATION_SPRAY | CUTANEOUS | Status: DC | PRN

## 2012-04-29 MED ORDER — OXYCODONE-ACETAMINOPHEN 5-325 MG PO TABS: 1.0000 | ORAL_TABLET | ORAL | Status: DC | PRN

## 2012-04-29 MED ORDER — LANOLIN HYDROUS EX OINT: TOPICAL_OINTMENT | CUTANEOUS | Status: DC | PRN

## 2012-04-29 MED ORDER — ONDANSETRON HCL 4 MG PO TABS: 4.0000 mg | ORAL_TABLET | ORAL | Status: DC | PRN

## 2012-04-29 MED ORDER — WITCH HAZEL-GLYCERIN EX PADS: 1.0000 "application " | MEDICATED_PAD | CUTANEOUS | Status: DC | PRN

## 2012-04-29 MED ORDER — LIDOCAINE HCL (PF) 1 % IJ SOLN
INTRAMUSCULAR | Status: DC | PRN
  Administered 2012-04-29 (×3): 4 mL

## 2012-04-29 MED ORDER — FENTANYL 2.5 MCG/ML BUPIVACAINE 1/10 % EPIDURAL INFUSION (WH - ANES): 14.0000 mL/h | INTRAMUSCULAR | Status: DC

## 2012-04-29 MED ORDER — DIBUCAINE 1 % RE OINT: 1.0000 "application " | TOPICAL_OINTMENT | RECTAL | Status: DC | PRN

## 2012-04-29 NOTE — Anesthesia Preprocedure Evaluation (Signed)
Anesthesia Evaluation  Patient identified by MRN, date of birth, ID band Patient awake    Reviewed: Allergy & Precautions, H&P , NPO status , Patient's Chart, lab work & pertinent test results, reviewed documented beta blocker date and time   History of Anesthesia Complications Negative for: history of anesthetic complications  Airway Mallampati: III TM Distance: >3 FB Neck ROM: full    Dental  (+) Teeth Intact   Pulmonary neg pulmonary ROS,  breath sounds clear to auscultation        Cardiovascular hypertension, On Home Beta Blockers Rhythm:regular Rate:Normal     Neuro/Psych negative neurological ROS  negative psych ROS   GI/Hepatic negative GI ROS, Neg liver ROS,   Endo/Other  Morbid obesity  Renal/GU negative Renal ROS  negative genitourinary   Musculoskeletal   Abdominal   Peds  Hematology negative hematology ROS (+)   Anesthesia Other Findings Itching from dilaudid  Reproductive/Obstetrics (+) Pregnancy (h/o c/s x1 for breech in 1993)                           Anesthesia Physical Anesthesia Plan  ASA: III  Anesthesia Plan: Epidural   Post-op Pain Management:    Induction:   Airway Management Planned:   Additional Equipment:   Intra-op Plan:   Post-operative Plan:   Informed Consent: I have reviewed the patients History and Physical, chart, labs and discussed the procedure including the risks, benefits and alternatives for the proposed anesthesia with the patient or authorized representative who has indicated his/her understanding and acceptance.     Plan Discussed with:   Anesthesia Plan Comments:         Anesthesia Quick Evaluation

## 2012-04-29 NOTE — Progress Notes (Signed)
Delivery Note  Pre op- IOL for cHTN, maternal exhaustion Post op - same Procedure- VAVD OB- Nicholaus Bloom, MD Anesthesia- epidural Findings- living female Apgars 9 and 9                 Intact placenta with 3 vessel cord                 Intact perineum Comp- none EBL- less than 300 mL  After risks and benefits were discussed, I used the Kiwi with one contraction and delivered the baby. A loose nuchal cord was removed.

## 2012-04-29 NOTE — Anesthesia Procedure Notes (Signed)
Epidural Patient location during procedure: OB Start time: 04/29/2012 7:17 AM Reason for block: procedure for pain  Staffing Performed by: anesthesiologist   Preanesthetic Checklist Completed: patient identified, site marked, surgical consent, pre-op evaluation, timeout performed, IV checked, risks and benefits discussed and monitors and equipment checked  Epidural Patient position: sitting Prep: site prepped and draped and DuraPrep Patient monitoring: continuous pulse ox and blood pressure Approach: right paramedian Injection technique: LOR air  Needle:  Needle type: Tuohy  Needle gauge: 17 G Needle length: 9 cm Needle insertion depth: 6 cm Catheter type: closed end flexible Catheter size: 19 Gauge Catheter at skin depth: 11 cm Test dose: negative  Assessment Events: blood not aspirated, injection not painful, no injection resistance, negative IV test and no paresthesia  Additional Notes Discussed risk of headache, infection, bleeding, nerve injury and failed or incomplete block.  Patient voices understanding and wishes to proceed.  Difficulty with placement due to positioning and patient motion.  Eventual approach right paramedian with good LOR to air at 6 cm.  Jasmine December, MD

## 2012-04-29 NOTE — Progress Notes (Signed)
S. Comfortable with epidural. O. VSS, AF      FHR- reactive      CVX- 5/95/+2 A/P. IOL for cHTN- progressing well. Expect SVD.

## 2012-04-29 NOTE — Progress Notes (Signed)
S. Comfortable O. VSS,AF      CVX- 9 1/2/ 99/+3      FHR- reassuring      CTX- q 2 min A/P. IOL- Expect SVD

## 2012-04-30 ENCOUNTER — Encounter (HOSPITAL_COMMUNITY): Payer: Self-pay | Admitting: Anesthesiology

## 2012-04-30 ENCOUNTER — Encounter (HOSPITAL_COMMUNITY)
Admission: RE | Disposition: A | Discharge status: Home / Self Care | Payer: Self-pay | Source: Ambulatory Visit | Attending: Obstetrics & Gynecology

## 2012-04-30 LAB — MRSA PCR SCREENING: MRSA by PCR: POSITIVE — AB

## 2012-04-30 SURGERY — LIGATION, FALLOPIAN TUBE, POSTPARTUM
Anesthesia: Spinal | Site: Abdomen | Laterality: Bilateral | Wound class: Clean

## 2012-04-30 MED ORDER — SODIUM CHLORIDE 0.9 % IR SOLN
Status: DC | PRN
  Administered 2012-04-30: 1000 mL

## 2012-04-30 MED ORDER — LACTATED RINGERS IV SOLN
INTRAVENOUS | Status: DC
  Administered 2012-04-30: 11:00:00 via INTRAVENOUS

## 2012-04-30 MED ORDER — MUPIROCIN 2 % EX OINT
1.0000 "application " | TOPICAL_OINTMENT | Freq: Two times a day (BID) | CUTANEOUS | Status: DC
  Administered 2012-04-30 – 2012-05-01 (×3): 1 via NASAL
  Filled 2012-04-30: qty 22

## 2012-04-30 MED ORDER — BUPIVACAINE HCL (PF) 0.5 % IJ SOLN
INTRAMUSCULAR | Status: AC
  Filled 2012-04-30: qty 30

## 2012-04-30 MED ORDER — CHLORHEXIDINE GLUCONATE CLOTH 2 % EX PADS
6.0000 | MEDICATED_PAD | Freq: Every day | CUTANEOUS | Status: DC
  Administered 2012-04-30 – 2012-05-01 (×2): 6 via TOPICAL

## 2012-04-30 MED ORDER — BUPIVACAINE HCL (PF) 0.5 % IJ SOLN
INTRAMUSCULAR | Status: DC | PRN
  Administered 2012-04-30: 10 mL

## 2012-04-30 MED ORDER — METOCLOPRAMIDE HCL 10 MG PO TABS
10.0000 mg | ORAL_TABLET | Freq: Once | ORAL | Status: AC
  Administered 2012-04-30: 10 mg via ORAL
  Filled 2012-04-30: qty 1

## 2012-04-30 MED ORDER — FAMOTIDINE 20 MG PO TABS
40.0000 mg | ORAL_TABLET | Freq: Once | ORAL | Status: AC
  Administered 2012-04-30: 40 mg via ORAL
  Filled 2012-04-30: qty 2

## 2012-04-30 SURGICAL SUPPLY — 24 items
BENZOIN TINCTURE PRP APPL 2/3 (GAUZE/BANDAGES/DRESSINGS) IMPLANT
CHLORAPREP W/TINT 26ML (MISCELLANEOUS) IMPLANT
CLIP FILSHIE TUBAL LIGA STRL (Clip) ×2 IMPLANT
CLOTH BEACON ORANGE TIMEOUT ST (SAFETY) IMPLANT
ELECT REM PT RETURN 9FT ADLT (ELECTROSURGICAL)
ELECTRODE REM PT RTRN 9FT ADLT (ELECTROSURGICAL) IMPLANT
GLOVE BIO SURGEON STRL SZ7 (GLOVE) IMPLANT
GLOVE BIOGEL PI IND STRL 7.0 (GLOVE) IMPLANT
GLOVE BIOGEL PI INDICATOR 7.0 (GLOVE)
GOWN PREVENTION PLUS LG XLONG (DISPOSABLE) IMPLANT
NEEDLE HYPO 25X1 1.5 SAFETY (NEEDLE) IMPLANT
NS IRRIG 1000ML POUR BTL (IV SOLUTION) IMPLANT
PACK ABDOMINAL MINOR (CUSTOM PROCEDURE TRAY) IMPLANT
PENCIL BUTTON HOLSTER BLD 10FT (ELECTRODE) IMPLANT
SPONGE LAP 4X18 X RAY DECT (DISPOSABLE) IMPLANT
STRIP CLOSURE SKIN 1/2X4 (GAUZE/BANDAGES/DRESSINGS) IMPLANT
SUT CHROMIC 2 0 TIES 18 (SUTURE) IMPLANT
SUT VIC AB 0 CT1 27 (SUTURE)
SUT VIC AB 0 CT1 27XBRD ANBCTR (SUTURE) IMPLANT
SUT VICRYL 4-0 PS2 18IN ABS (SUTURE) IMPLANT
SYR CONTROL 10ML LL (SYRINGE) IMPLANT
TOWEL OR 17X24 6PK STRL BLUE (TOWEL DISPOSABLE) IMPLANT
TRAY FOLEY CATH 14FR (SET/KITS/TRAYS/PACK) IMPLANT
WATER STERILE IRR 1000ML POUR (IV SOLUTION) IMPLANT

## 2012-04-30 NOTE — Progress Notes (Signed)
Patient informed me that she does not want to do a BTL right now, considering IUD for postpartum contraception.  She was told she can still get interval laparoscopic BTL if she changes her mind.  Will discontinue NPO order, regular diet ordered.  Routine postpartum care.  Jaynie Collins, M.D. 04/30/2012 11:24 AM

## 2012-04-30 NOTE — Progress Notes (Signed)
Post Partum Day 1 Subjective: no complaints, voiding and desires BTS for contraception  Objective: Blood pressure 129/79, pulse 87, temperature 98.5 F (36.9 C), temperature source Oral, resp. rate 20, height 5\' 7"  (1.702 m), weight 224 lb (101.606 kg), last menstrual period 06/16/2011, SpO2 100.00%, unknown if currently breastfeeding.  Physical Exam:  General: alert, appears stated age and no distress Lochia: appropriate Uterine Fundus: firm Incision: n/a DVT Evaluation: No evidence of DVT seen on physical exam.   Basename 04/28/12 0700  HGB 9.4*  HCT 29.3*    Assessment/Plan: Plan for discharge tomorrow, Breastfeeding and Contraception BTS NPO after breakfast for BTS this afternoon   LOS: 2 days   Sieanna Vanstone E. 04/30/2012, 5:59 AM

## 2012-04-30 NOTE — Anesthesia Postprocedure Evaluation (Signed)
  Anesthesia Post-op Note  Patient: Connie Mills  Procedure(s) Performed: * No procedures listed *  Patient Location: Mother/Baby  Anesthesia Type: Epidural  Level of Consciousness: awake  Airway and Oxygen Therapy: Patient Spontanous Breathing  Post-op Pain: mild  Post-op Assessment: Patient's Cardiovascular Status Stable and Respiratory Function Stable  Post-op Vital Signs: stable  Complications: No apparent anesthesia complications

## 2012-05-01 MED ORDER — LABETALOL HCL 200 MG PO TABS: 200.0000 mg | ORAL_TABLET | Freq: Two times a day (BID) | ORAL | Status: DC

## 2012-05-01 MED ORDER — ACETAMINOPHEN-CODEINE 300-30 MG PO TABS: 1.0000 | ORAL_TABLET | ORAL | Status: DC | PRN

## 2012-05-01 MED ORDER — IBUPROFEN 600 MG PO TABS: 600.0000 mg | ORAL_TABLET | Freq: Four times a day (QID) | ORAL | Status: AC | PRN

## 2012-05-01 MED ORDER — METOPROLOL SUCCINATE ER 50 MG PO TB24: 50.0000 mg | ORAL_TABLET | Freq: Every day | ORAL | Status: DC

## 2012-05-01 NOTE — Discharge Instructions (Signed)

## 2012-05-01 NOTE — Discharge Summary (Signed)
Obstetric Discharge Summary Reason for Admission: induction of labor Prenatal Procedures: NST and ultrasound Intrapartum Procedures: vacuum Postpartum Procedures: none Complications-Operative and Postpartum: none Hemoglobin  Date Value Range Status  04/28/2012 9.4* 12.0 - 15.0 g/dL Final     HCT  Date Value Range Status  04/28/2012 29.3* 36.0 - 46.0 % Final    Physical Exam:  General: alert, cooperative and appears stated age CVS:  RRR, without murmur, gallops, or rubs Lungs:  CTA bilat ABD:  +BSx4, normal Lochia: appropriate Uterine Fundus: firm, 2 below U Incision: n/a Extremities: 1+ pedal edema;DVT Evaluation: No evidence of DVT seen on physical exam. Negative Homan's sign.  Discharge Diagnoses: Term Pregnancy-delivered  Discharge Information: Date: 05/01/2012 Activity: pelvic rest Diet: routine Medications: Tylenol #3, Ibuprofen and Toprol XL Condition: stable Instructions: See info provided Discharge to: home Follow-up Information    Follow up with [facility] in 4 weeks.   Contact information:   [address] [place] [address]          Newborn Data: Live born female  Birth Weight: 5 lb 10 oz (2551 g) APGAR: 9, 9  Home with mother.  [facility] 05/01/2012, 8:30 AM

## 2012-05-01 NOTE — Discharge Summary (Signed)
Medical Screening exam and patient care preformed by advanced practice provider.  Agree with the above management.  

## 2012-05-02 ENCOUNTER — Encounter (HOSPITAL_COMMUNITY): Payer: Self-pay | Admitting: Obstetrics & Gynecology

## 2012-05-02 ENCOUNTER — Telehealth: Payer: Self-pay | Admitting: *Deleted

## 2012-05-02 NOTE — Telephone Encounter (Signed)
RX request for RF on Labetalol received but pt is no longer on this.  Message sent to [facility] that pt was now on Toprol.  This was an automated RF request.

## 2012-05-10 ENCOUNTER — Other Ambulatory Visit: Payer: Self-pay | Admitting: Physician Assistant

## 2012-05-13 ENCOUNTER — Ambulatory Visit (INDEPENDENT_AMBULATORY_CARE_PROVIDER_SITE_OTHER): Payer: Medicaid Other | Admitting: Advanced Practice Midwife

## 2012-05-13 ENCOUNTER — Encounter: Payer: Self-pay | Admitting: Advanced Practice Midwife

## 2012-05-13 VITALS — BP 157/99 | HR 88 | Temp 98.0°F | Resp 16 | Ht 67.0 in | Wt 211.0 lb

## 2012-05-13 DIAGNOSIS — O10919 Unspecified pre-existing hypertension complicating pregnancy, unspecified trimester: Secondary | ICD-10-CM

## 2012-05-13 DIAGNOSIS — I10 Essential (primary) hypertension: Secondary | ICD-10-CM

## 2012-05-13 DIAGNOSIS — O10019 Pre-existing essential hypertension complicating pregnancy, unspecified trimester: Secondary | ICD-10-CM

## 2012-05-13 NOTE — Progress Notes (Signed)
Subjective:    Patient ID: Connie Mills, female    DOB: January 11, 1976, 36 y.o.   MRN: [id number]  HPI This is a 36 y.o. female who is s/p vacuum assisted vaginal delivery on 04/29/12 who presents for BP checkup. Has been on Toprol 50mg /day and Plendil was ordered by her family doctor.    Review of Systems Denies visual changes but has headaches. No swelling.     Objective:   Physical Exam  Constitutional: She is oriented to person, place, and time. She appears well-developed and well-nourished. No distress.  HENT:  Head: Normocephalic.  Cardiovascular: Normal rate.   Pulmonary/Chest: Effort normal.  Abdominal: Soft. She exhibits no distension and no mass. There is no tenderness. There is no rebound and no guarding.  Musculoskeletal: She exhibits edema (trace).  Neurological: She is alert and oriented to person, place, and time.  Skin: Skin is warm and dry.  Psychiatric: She has a normal mood and affect.    Filed Vitals:   05/13/12 0900  BP: 157/99  Pulse: 88  Temp: 98 F (36.7 C)  Resp: 16         Assessment & Plan:  A:  2 weeks PostPartum      Chronic Hypertension with postpartum exacerbation  P:  Per Dr Penne Lash, increase Toprol to 100mg  Q[facility]       Recheck BP in 2-3 days

## 2012-05-16 ENCOUNTER — Ambulatory Visit (INDEPENDENT_AMBULATORY_CARE_PROVIDER_SITE_OTHER): Payer: PRIVATE HEALTH INSURANCE | Admitting: *Deleted

## 2012-05-16 VITALS — BP 197/102 | HR 64 | Resp 16

## 2012-05-16 DIAGNOSIS — I10 Essential (primary) hypertension: Secondary | ICD-10-CM

## 2012-05-16 NOTE — Progress Notes (Signed)
Pt here for a b/p check only.  B/P 196/100 and 197/102.  Pt denies any visual disturbances but does have a slight headache.  She got caught in traffic coming here and feels that is what made her B/P shoot up.  She states that her pressure this morning at home was 135/83.  Spoke with Dr Marice Potter and she is increasing the Plendil 10mg  to BID and cont. The Toprol BID.  She will return tomorrow to recheck BP.  She is instructed that if she has any further headache or visual disturbances to go straight to the MAU.

## 2012-05-17 ENCOUNTER — Encounter: Payer: Medicaid Other | Admitting: Obstetrics & Gynecology

## 2012-05-17 NOTE — Patient Instructions (Signed)

## 2012-05-18 ENCOUNTER — Ambulatory Visit (INDEPENDENT_AMBULATORY_CARE_PROVIDER_SITE_OTHER): Payer: PRIVATE HEALTH INSURANCE | Admitting: Obstetrics & Gynecology

## 2012-05-18 ENCOUNTER — Encounter: Payer: Self-pay | Admitting: Obstetrics & Gynecology

## 2012-05-18 VITALS — BP 142/87 | HR 76 | Temp 98.0°F | Resp 16 | Ht 67.0 in | Wt 209.0 lb

## 2012-05-18 DIAGNOSIS — I10 Essential (primary) hypertension: Secondary | ICD-10-CM

## 2012-05-18 MED ORDER — FELODIPINE ER 10 MG PO TB24: ORAL_TABLET | ORAL | Status: DC

## 2012-05-18 NOTE — Progress Notes (Signed)
Subjective:    Patient ID: Connie Mills, female    DOB: [birth year], 36 y.o.   MRN: [id number]  HPI  She is here for a BP check. She has been taking her plendil BID and her BPs are reasonable. She has already established a visit with Dr. Darra Lis for BP management in the past. She is considering Nexplanon, Mirena, or BTL for birth control and has a pp visit next week. She has no complaints.  Review of Systems     Objective:   Physical Exam        Assessment & Plan:  BPs improved. I will e prescribe her new plendil dose.

## 2012-05-27 ENCOUNTER — Encounter: Payer: Self-pay | Admitting: Advanced Practice Midwife

## 2012-05-27 ENCOUNTER — Ambulatory Visit (INDEPENDENT_AMBULATORY_CARE_PROVIDER_SITE_OTHER): Payer: Medicaid Other | Admitting: Advanced Practice Midwife

## 2012-05-27 NOTE — Progress Notes (Signed)
  Subjective:     Connie Mills is a 36 y.o. female who presents for a postpartum visit. She is 4 weeks postpartum following a vacuum assisted vaginal delivery. I have fully reviewed the prenatal and intrapartum course. The delivery was at term Outcome: vacuum, low. Anesthesia: epidural. Postpartum course has been remarkable for hypertension. Baby's course has been uneventful. Baby is feeding by both breast and bottle - wants to restart lactation. Bleeding no bleeding. Bowel function is normal. Bladder function is normal. Patient is not sexually active. Contraception method is IUD. Postpartum depression screening: negative.  The following portions of the patient's history were reviewed and updated as appropriate: allergies, current medications, past family history, past medical history, past social history, past surgical history and problem list.  Review of Systems Pertinent items are noted in HPI.   Objective:    BP 127/75  Pulse 86  Temp 98.6 F (37 C) (Oral)  Resp 16  Ht 5\' 9"  (1.753 m)  Wt 209 lb (94.802 kg)  BMI 30.86 kg/m2  Breastfeeding? No  General:  alert and no distress   Breasts:  inspection negative, no nipple discharge or bleeding, no masses or nodularity palpable  Lungs:   Heart:    Abdomen: soft, non-tender; bowel sounds normal; no masses,  no organomegaly   Vulva:  not evaluated  Vagina: not evaluated  Cervix:    Corpus: not examined  Adnexa:  not evaluated  Rectal Exam: Not performed.        Assessment:    Normal postpartum exam. Improved Hypertension Pap smear not done at today's visit.   Plan:    1. Contraception: IUD  Will schedule insertion for next week 2. Patient will call primary MD to evaluate for ongoing treatment of Hypertension.  3. Follow up in: 1 week or as needed.

## 2012-05-27 NOTE — Patient Instructions (Addendum)

## 2012-05-30 ENCOUNTER — Ambulatory Visit (INDEPENDENT_AMBULATORY_CARE_PROVIDER_SITE_OTHER): Payer: PRIVATE HEALTH INSURANCE | Admitting: Advanced Practice Midwife

## 2012-05-30 ENCOUNTER — Encounter: Payer: Self-pay | Admitting: Advanced Practice Midwife

## 2012-05-30 VITALS — BP 144/80 | HR 67 | Temp 98.6°F | Resp 16 | Ht 69.0 in | Wt 209.0 lb

## 2012-05-30 DIAGNOSIS — Z30430 Encounter for insertion of intrauterine contraceptive device: Secondary | ICD-10-CM

## 2012-05-30 DIAGNOSIS — Z01812 Encounter for preprocedural laboratory examination: Secondary | ICD-10-CM

## 2012-05-30 LAB — POCT URINE PREGNANCY: Preg Test, Ur: NEGATIVE

## 2012-05-30 MED ORDER — LEVONORGESTREL 20 MCG/24HR IU IUD
INTRAUTERINE_SYSTEM | Freq: Once | INTRAUTERINE | Status: AC
  Administered 2012-05-30: 11:00:00 via INTRAUTERINE

## 2012-05-30 NOTE — Progress Notes (Signed)
Patient identified, informed consent performed, signed copy in chart, time out was performed.  Urine pregnancy test negative.  Bimanual exam performed. Cervix anterior. Uterus retroverted/flexed.   Speculum placed in the vagina.  Cervix visualized.  Cleaned with Betadine x 2.  Grasped anteriourly with a single tooth tenaculum.  Uterus sounded to 7 cm. Tenaculum reposition to posterior lip of cervix to aid in straightening out cervix. Mirena IUD placed per manufacturer's recommendations.  Strings trimmed to 3 cm. Due to retroflexed uterus placement verified by transvaginal US.   Patient given post procedure instructions and Mirena care card with expiration date.  Patient is asked to check IUD strings periodically and follow up in 4-6 weeks for IUD check.   [place], CNM 05/30/2012 11:46 AM

## 2012-05-30 NOTE — Patient Instructions (Signed)
Intrauterine Device Insertion Care After Refer to this sheet in the next few weeks. These instructions provide you with information on caring for yourself after your procedure. Your caregiver may also give you more specific instructions. Your treatment has been planned according to current medical practices, but problems sometimes occur. Call your caregiver if you have any problems or questions after your procedure. HOME CARE INSTRUCTIONS   Only take over-the-counter or prescription medicines for pain, discomfort, or fever as directed by your caregiver. Do not use aspirin. This may increase bleeding.   Check your IUD to make sure it is in place before you resume sexual activity. You should be able to feel the strings. If you cannot feel the strings, something may be wrong. The IUD may have fallen out of the uterus, or the uterus may have been punctured (perforated) during placement. Also, if the strings are getting longer, it may mean that the IUD is being forced out of the uterus. You no longer have full protection from pregnancy if any of these problems occur.   You may resume sexual intercourse if you are not having problems with the IUD. The IUD is considered immediately effective.   You may resume normal activities.   Keep all follow-up appointments to be sure your IUD has remained in place. After the first exam, yearly exams are advised, unless you cannot feel the strings of your IUD.   Continue to check that the IUD is still in place by feeling for the strings after every menstrual period.  SEEK MEDICAL CARE IF:   You have bleeding that is heavier or lasts longer than a normal menstrual cycle.   You have a fever.   You have increasing cramps or abdominal pain not relieved with medicine.   You have abdominal pain that does not seem to be related to the same area of earlier cramping and pain.   You are lightheaded, unusually weak, or faint.   You have abnormal vaginal discharge or  smells.   You have pain during sexual intercourse.   You cannot feel the IUD strings, or the IUD string has gotten longer.   You feel the IUD at the opening of the cervix in the vagina.   You think you are pregnant, or you miss your menstrual period.   The IUD string is hurting your sex partner.  Document Released: 07/01/2011 Document Revised: 10/22/2011 Document Reviewed: 07/01/2011 Exit[employer] Patient Information 2012 [employer]. 

## 2012-06-06 ENCOUNTER — Telehealth: Payer: Self-pay | Admitting: *Deleted

## 2012-06-06 DIAGNOSIS — I10 Essential (primary) hypertension: Secondary | ICD-10-CM

## 2012-06-06 MED ORDER — METOPROLOL SUCCINATE ER 50 MG PO TB24: 50.0000 mg | ORAL_TABLET | Freq: Two times a day (BID) | ORAL | Status: DC

## 2012-06-06 NOTE — Telephone Encounter (Signed)
Pt called stating that she has run out of her Toprol XL 50mg  because she was told by the CNM who consulted with Dr Marice Potter to increase to BID instead of Daily.  New RX sent to pharmacy.

## 2012-06-13 ENCOUNTER — Ambulatory Visit (INDEPENDENT_AMBULATORY_CARE_PROVIDER_SITE_OTHER): Payer: PRIVATE HEALTH INSURANCE | Admitting: Family Medicine

## 2012-06-13 ENCOUNTER — Encounter: Payer: Self-pay | Admitting: Family Medicine

## 2012-06-13 VITALS — BP 152/95 | HR 118 | Ht 67.0 in | Wt 205.0 lb

## 2012-06-13 DIAGNOSIS — I10 Essential (primary) hypertension: Secondary | ICD-10-CM

## 2012-06-13 DIAGNOSIS — D649 Anemia, unspecified: Secondary | ICD-10-CM

## 2012-06-13 LAB — CBC WITH DIFFERENTIAL/PLATELET
Basophils Absolute: 0.1 10*3/uL (ref 0.0–0.1)
Basophils Relative: 1 % (ref 0–1)
Eosinophils Absolute: 0.2 10*3/uL (ref 0.0–0.7)
Eosinophils Relative: 3 % (ref 0–5)
HCT: 35.3 % — ABNORMAL LOW (ref 36.0–46.0)
Hemoglobin: 11.1 g/dL — ABNORMAL LOW (ref 12.0–15.0)
Lymphocytes Relative: 25 % (ref 12–46)
Lymphs Abs: 1.6 10*3/uL (ref 0.7–4.0)
MCH: 23 pg — ABNORMAL LOW (ref 26.0–34.0)
MCHC: 31.4 g/dL (ref 30.0–36.0)
MCV: 73.1 fL — ABNORMAL LOW (ref 78.0–100.0)
Monocytes Absolute: 0.5 10*3/uL (ref 0.1–1.0)
Monocytes Relative: 7 % (ref 3–12)
Neutro Abs: 4.2 10*3/uL (ref 1.7–7.7)
Neutrophils Relative %: 64 % (ref 43–77)
Platelets: 315 10*3/uL (ref 150–400)
RBC: 4.83 MIL/uL (ref 3.87–5.11)
RDW: 15.7 % — ABNORMAL HIGH (ref 11.5–15.5)
WBC: 6.5 10*3/uL (ref 4.0–10.5)

## 2012-06-13 LAB — LIPID PANEL
Cholesterol: 220 mg/dL — ABNORMAL HIGH (ref 0–200)
HDL: 46 mg/dL (ref >39)
LDL Cholesterol: 157 mg/dL — ABNORMAL HIGH (ref 0–99)
Total CHOL/HDL Ratio: 4.8 Ratio
Triglycerides: 85 mg/dL (ref <150)
VLDL: 17 mg/dL (ref 0–40)

## 2012-06-13 LAB — TSH: TSH: 1.089 u[IU]/mL (ref 0.350–4.500)

## 2012-06-13 LAB — FERRITIN: Ferritin: 51 ng/mL (ref 10–291)

## 2012-06-13 LAB — VITAMIN B12: Vitamin B-12: 595 pg/mL (ref 211–911)

## 2012-06-13 MED ORDER — HYDROCHLOROTHIAZIDE 25 MG PO TABS: 25.0000 mg | ORAL_TABLET | Freq: Every day | ORAL | Status: DC

## 2012-06-13 NOTE — Progress Notes (Signed)
Subjective:    Patient ID: Connie Mills, female    DOB: [birth year], 36 y.o.   MRN: [id number]  HPI Here for BP just had baby 6 weeks ago.  Has been checking BP at home.  BP at home 138/90.  Restarted meds about 2 weeks ago, Plendil and toprol. She is on them BID.  She is using mirena for birth control.  She is not nursing.  No CP ro SOB. No dizziness or palps.  Taking her meds regularly.   She has been bleeding very heavily the last week since she is 6 weeks postpartum. She is not nursing. Her child is on [facility].   Review of Systems     Objective:   Physical Exam  Constitutional: She is oriented to person, place, and time. She appears well-developed and well-nourished.  HENT:  Head: Normocephalic and atraumatic.  Cardiovascular: Normal rate, regular rhythm and normal heart sounds.   Pulmonary/Chest: Effort normal and breath sounds normal.  Neurological: She is alert and oriented to person, place, and time.  Skin: Skin is warm and dry.  Psychiatric: She has a normal mood and affect. Her behavior is normal.          Assessment & Plan:  HTN - Not well controlled.  We will add hydrochlorothiazide to her regimen. Followup in one month to make sure well controlled. I did encourage her to eat a low-salt diet. She is not nursing so hydrochlorothiazide should be fine. Hopefully as she does well and continues to lose weight after her pregnancy her blood pressure should come down and we can hopefully wean her medications back down. A BMP at followup in one month to make sure kidney function and potassium are normal.  Iron def anemia - she is still bleeding heavily even though she now has a Mirena IUD in place. Recommend repeat CBC, ferritin and vitamin B12. She has complained of a creepy crawly sensation on her Lexapro arms at times. This could be from iron deficiency or maybe even from vitamin B12 deficiency. I did reassure her that this is not from her epidural. Lab Results  Component Value  Date   WBC 8.9 04/28/2012   HGB 9.4* 04/28/2012   HCT 29.3* 04/28/2012   MCV 73.6* 04/28/2012   PLT 266 04/28/2012

## 2012-07-14 ENCOUNTER — Ambulatory Visit (INDEPENDENT_AMBULATORY_CARE_PROVIDER_SITE_OTHER): Payer: BC Managed Care – PPO | Admitting: Obstetrics & Gynecology

## 2012-07-14 ENCOUNTER — Encounter: Payer: Self-pay | Admitting: Family Medicine

## 2012-07-14 ENCOUNTER — Encounter: Payer: Self-pay | Admitting: Obstetrics & Gynecology

## 2012-07-14 ENCOUNTER — Ambulatory Visit (INDEPENDENT_AMBULATORY_CARE_PROVIDER_SITE_OTHER): Payer: BC Managed Care – PPO | Admitting: Family Medicine

## 2012-07-14 VITALS — BP 144/76 | HR 85 | Wt 201.0 lb

## 2012-07-14 VITALS — BP 128/76 | HR 85 | Ht 67.0 in | Wt 199.0 lb

## 2012-07-14 DIAGNOSIS — Z30431 Encounter for routine checking of intrauterine contraceptive device: Secondary | ICD-10-CM

## 2012-07-14 DIAGNOSIS — I10 Essential (primary) hypertension: Secondary | ICD-10-CM

## 2012-07-14 MED ORDER — HYDROCHLOROTHIAZIDE 25 MG PO TABS: 25.0000 mg | ORAL_TABLET | Freq: Every day | ORAL | Status: DC

## 2012-07-14 NOTE — Progress Notes (Signed)
Subjective:    Patient ID: Connie Mills, female    DOB: 11/23/1975, 36 y.o.   MRN: [id number]  HPI HTN - No CP or SOB. Noticied some blurry vision last week.  Taking the HCTZ without any problems.  She says she had an incident with her daughter getting chest and not breathing overnight. End up calling EMS. They went to hospital. Her daughter is doing well. She's 51 weeks old. But unfortunately she also about 2 hours last night has not even sure she actually took her blood pressure pill today because of it. She says her home blood pressures have been running between 115- 130, systolic.   Review of Systems     Objective:   Physical Exam  Constitutional: She is oriented to person, place, and time. She appears well-developed and well-nourished.  HENT:  Head: Normocephalic and atraumatic.  Cardiovascular: Normal rate, regular rhythm and normal heart sounds.   Pulmonary/Chest: Effort normal and breath sounds normal.  Neurological: She is alert and oriented to person, place, and time.  Skin: Skin is warm and dry.  Psychiatric: She has a normal mood and affect. Her behavior is normal.          Assessment & Plan:  Hypertension-her home blood pressures sound very well-controlled. She is elevated today but it is likely she did not take her pill today. She also has not slept overnight and has had a very stressful day. Will go today based on her home blood pressures. Continue current regimen. Followup in 3-4 months. Check BMP today since we did start a diuretic 4 weeks ago. Call if any problems or side effects. She is Re: lost 4 pounds of her baby weight which is fantastic.

## 2012-07-14 NOTE — Progress Notes (Signed)
Subjective:    Patient ID: Connie Mills, female    DOB: 1976-03-28, 36 y.o.   MRN: [id number]  HPI  Mrs. Pernell Dupre is here for her string check. She has no complaints. Her husband has not mentioned feeling the strings with IC. She continues to have monthly periods (she is hoping that she will become amenorrheic).  Review of Systems     Objective:   Physical Exam  IUD strings seen easily     Assessment & Plan: Contra  Contraception- Mirena, doing well RTC 12/13 for annual

## 2012-07-15 LAB — BASIC METABOLIC PANEL WITH GFR
BUN: 18 mg/dL (ref 6–23)
CO2: 33 mEq/L — ABNORMAL HIGH (ref 19–32)
Calcium: 10 mg/dL (ref 8.4–10.5)
Chloride: 99 mEq/L (ref 96–112)
Creat: 0.92 mg/dL (ref 0.50–1.10)
GFR, Est African American: >89 mL/min
GFR, Est Non African American: 79 mL/min
Glucose, Bld: 88 mg/dL (ref 70–99)
Potassium: 3.3 mEq/L — ABNORMAL LOW (ref 3.5–5.3)
Sodium: 140 mEq/L (ref 135–145)

## 2012-08-10 NOTE — Telephone Encounter (Signed)
Preadmission screen  

## 2012-08-25 IMAGING — US US FETAL BPP W/O NONSTRESS
1 series · 13 of 16 positions shown · non-contrast
Comparison: none

[Series 1: us fetal bpp w/o nonstress · 0.23mm/px · 13 of 16 slices shown]
[im 1/16]
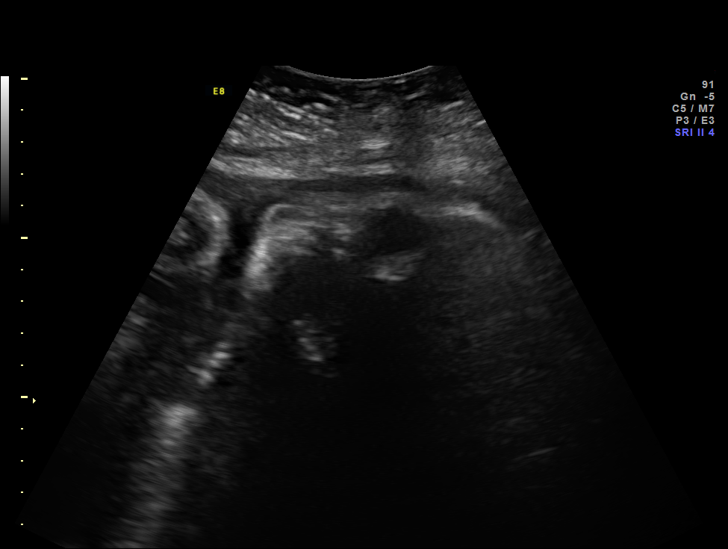
[im 2/16]
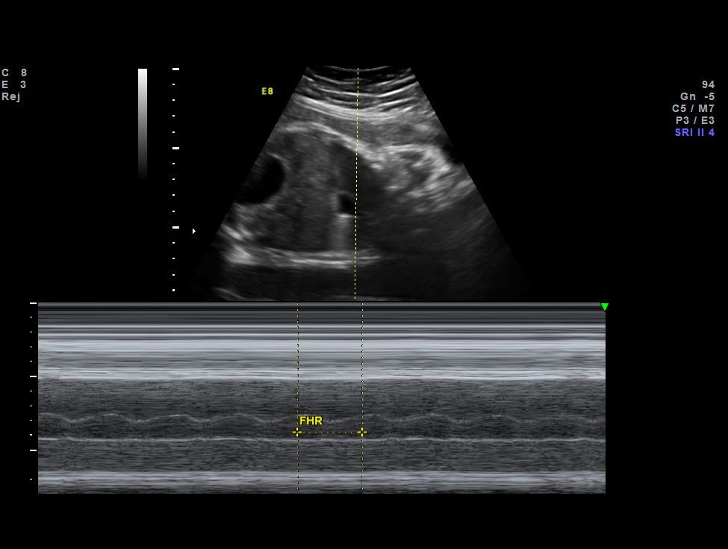
[im 4/16]
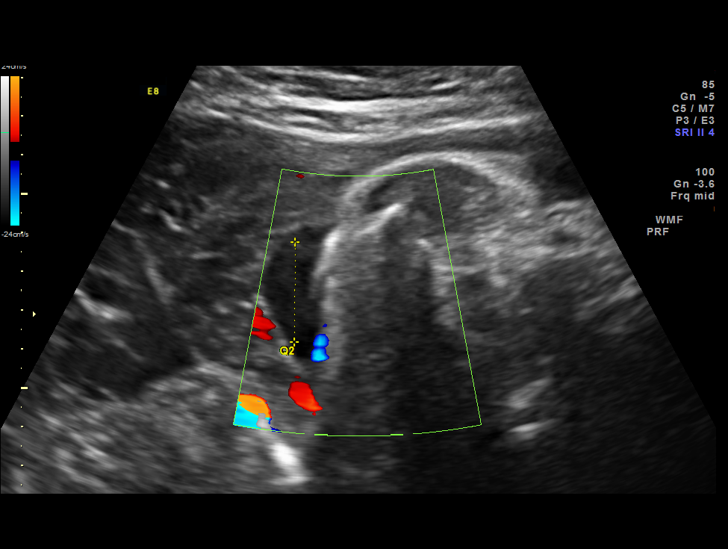
[im 5/16]
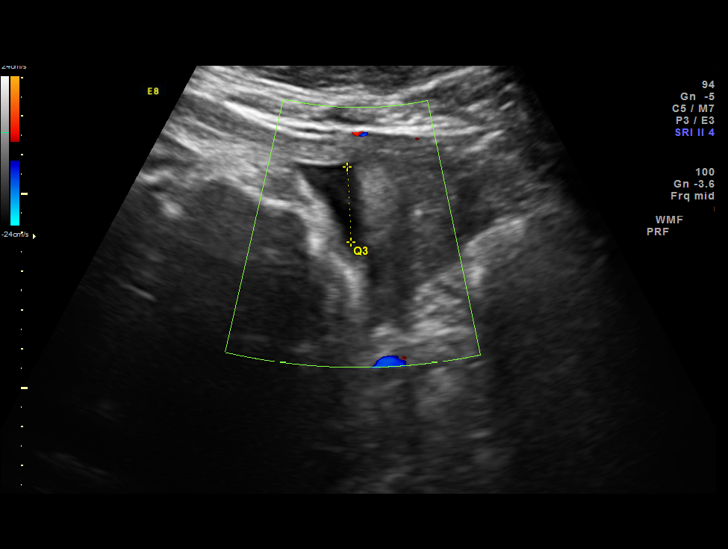
[im 6/16]
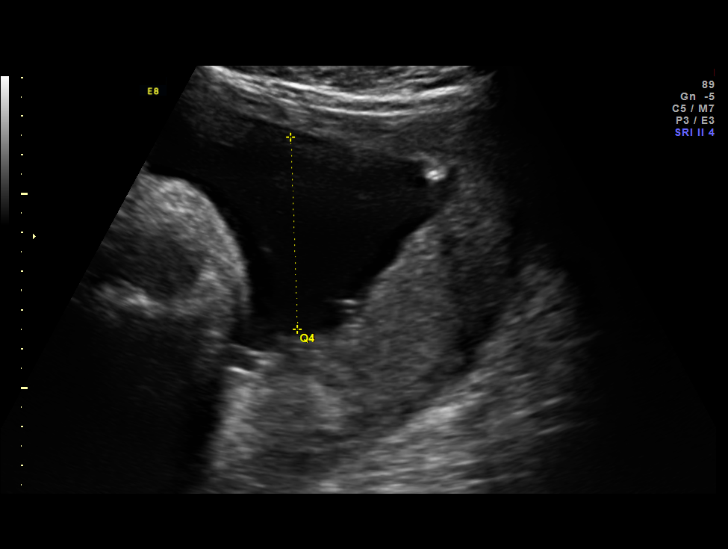
[im 7/16]
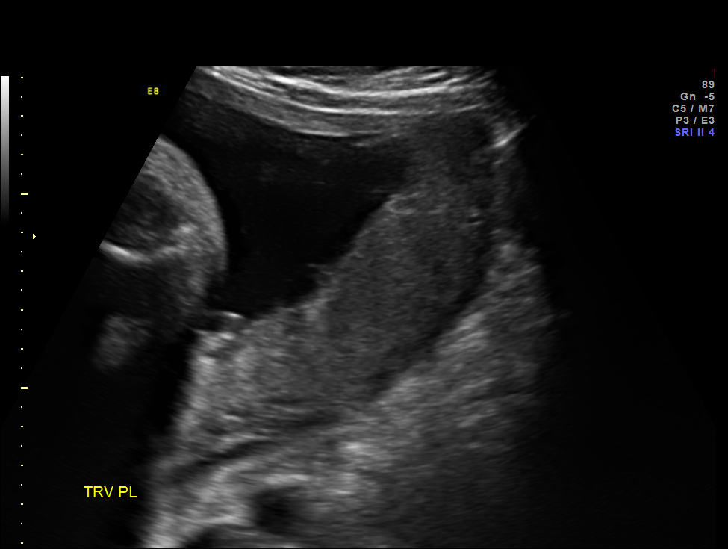
[im 9/16]
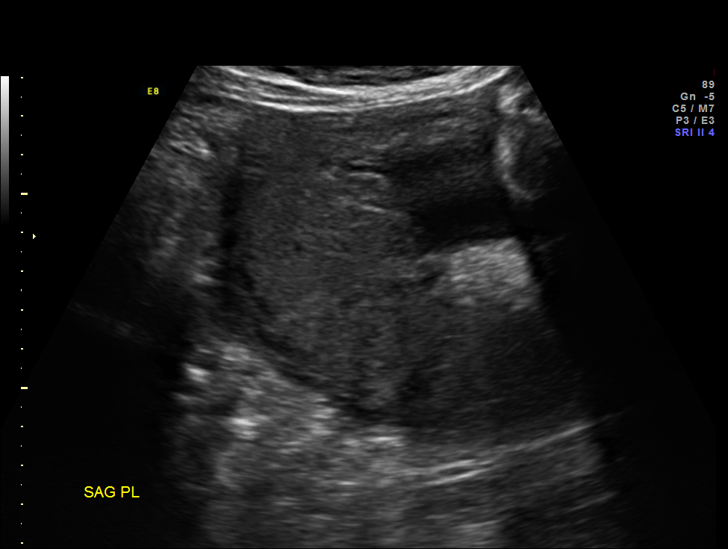
[im 10/16]
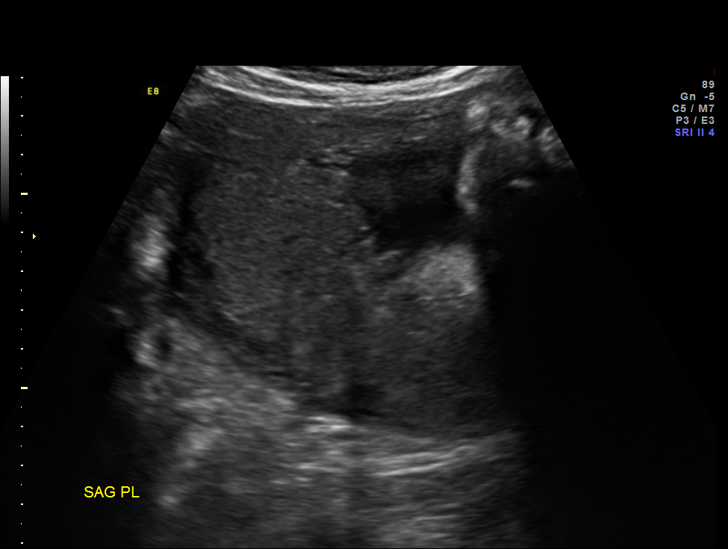
[im 11/16]
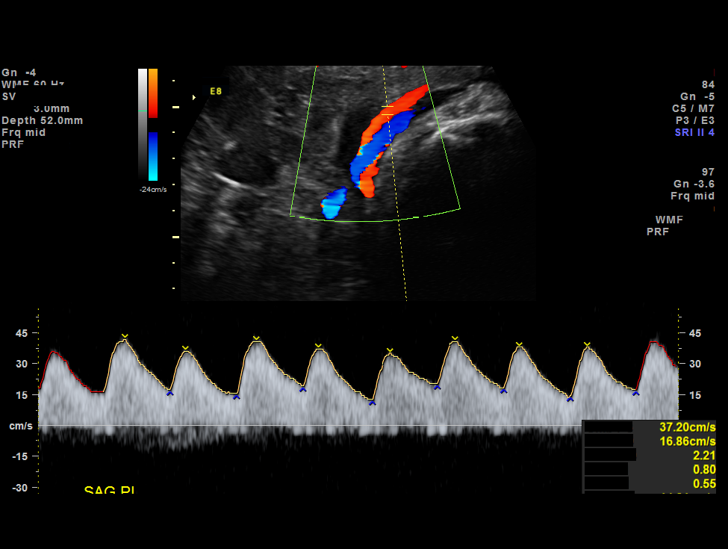
[im 12/16]
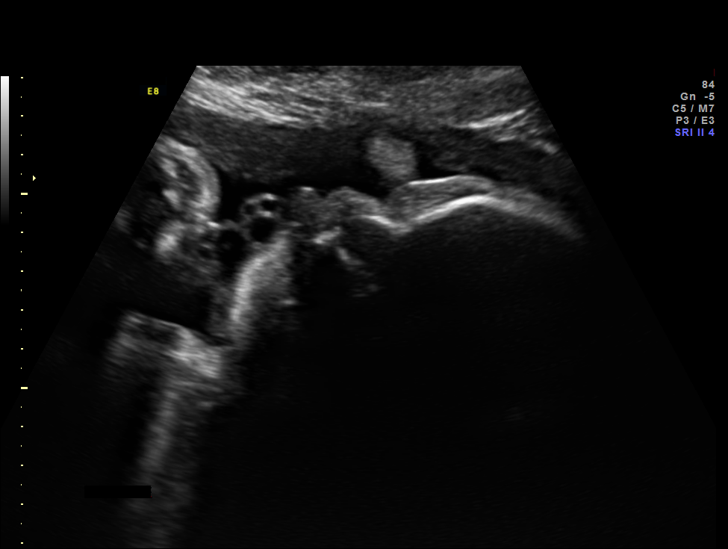
[im 13/16]
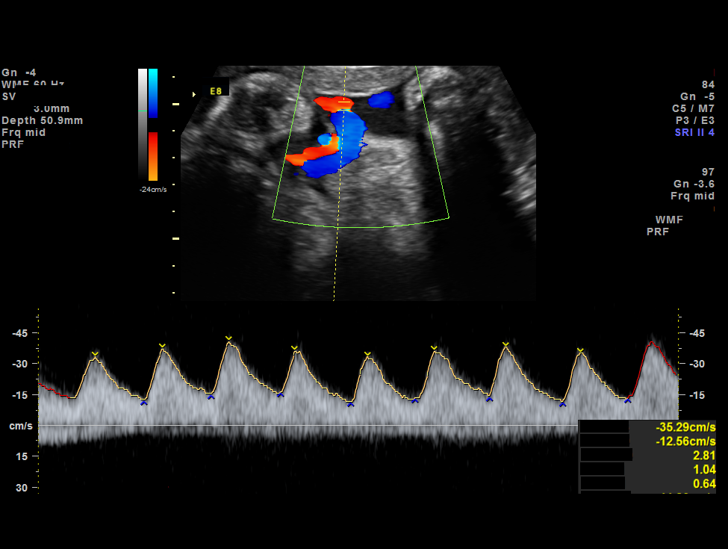
[im 15/16]
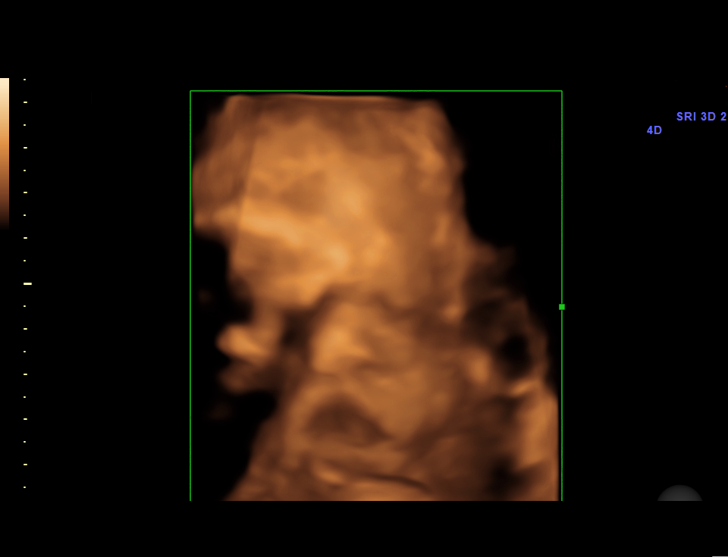
[im 16/16]
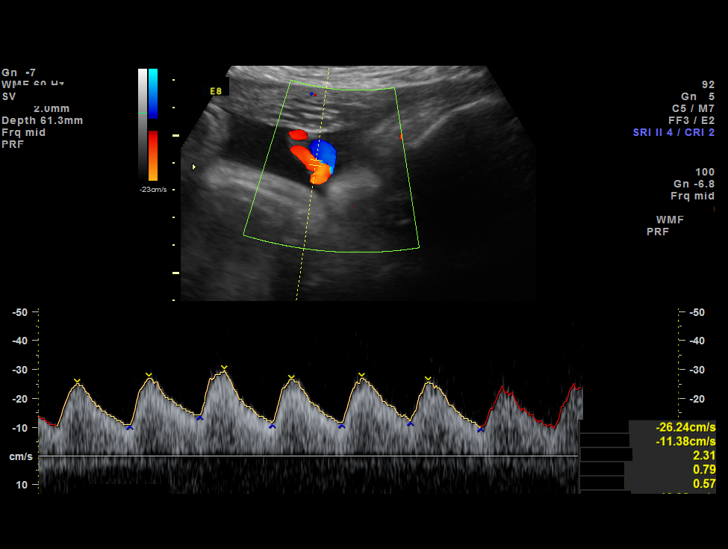

[13 of 16 positions shown; findings below may reference images not displayed]

OBSTETRICS REPORT
                      (Signed Final 04/12/2012 [DATE])

                 [REDACTED]-                       [HOSPITAL]
                 Faculty Physician
 Order#:         [id number]
                 33_O
Procedures

 US UA DOPPLER RE-EVAL                                 76828.1
Indications

 Size less than dates (Small for gestational [AGE]
 FGR)
 Advanced maternal age (AMA), Multigravida
 Hypertension - Chronic/Pre-existing (currently
 treated with Labetalol)
 Previous cesarean section
 Uterine fibroids
 Assess fetal well being
Fetal Evaluation

 Fetal Heart Rate:  155                          bpm
 Cardiac Activity:  Observed
 Presentation:      Cephalic
 Placenta:          Posterior, above cervical
                    os
 P. Cord            Previously Visualized
 Insertion:

 Amniotic Fluid
 AFI FV:      Subjectively within normal limits
 AFI Sum:     13.07   cm       46  %Tile     Larg Pckt:    4.96  cm
 RUQ:   3.59    cm   RLQ:    2.58   cm    LUQ:   1.94    cm   LLQ:    4.96   cm
Biophysical Evaluation

 Amniotic F.V:   Within normal limits       F. Tone:        Observed
 F. Movement:    Observed                   Score:          [DATE]
 F. Breathing:   Observed
Gestational Age

 Best:          36w 5d     Det. By:  Previous Ultrasound      EDD:   05/05/12
Doppler - Fetal Vessels
 Umbilical Artery
 S/D:   2.48           56  %tile       RI:
 PI:    0.9                            PSV:       -       cm/s


Cervix Uterus Adnexa

 Cervix:       Not visualized (advanced GA >34 wks)
Impression

 IUP at 36 [DATE]  weeks
 Normal amniotic fluid volume
 BPP [DATE]
 Normal umbilical artery Doppler studies without evidence of
 absent or reversed diastolic flow
Recommendations

 Recommend twice weekly BPP's and dopplers.

 questions or concerns.

## 2012-09-08 ENCOUNTER — Other Ambulatory Visit: Payer: Self-pay | Admitting: Family Medicine

## 2012-09-26 ENCOUNTER — Other Ambulatory Visit: Payer: Self-pay | Admitting: *Deleted

## 2012-09-26 DIAGNOSIS — E785 Hyperlipidemia, unspecified: Secondary | ICD-10-CM

## 2012-09-29 MED ORDER — METOPROLOL SUCCINATE ER 50 MG PO TB24: 50.0000 mg | ORAL_TABLET | Freq: Every day | ORAL | Status: DC

## 2012-10-03 LAB — LIPID PANEL
Cholesterol: 152 mg/dL (ref 0–200)
HDL: 41 mg/dL (ref >39)
LDL Cholesterol: 93 mg/dL (ref 0–99)
Total CHOL/HDL Ratio: 3.7 Ratio
Triglycerides: 88 mg/dL (ref <150)
VLDL: 18 mg/dL (ref 0–40)

## 2012-10-03 LAB — COMPLETE METABOLIC PANEL WITH GFR
ALT: 9 U/L (ref 0–35)
AST: 13 U/L (ref 0–37)
Albumin: 4.2 g/dL (ref 3.5–5.2)
Alkaline Phosphatase: 51 U/L (ref 39–117)
BUN: 14 mg/dL (ref 6–23)
CO2: 24 mEq/L (ref 19–32)
Calcium: 9.4 mg/dL (ref 8.4–10.5)
Chloride: 107 mEq/L (ref 96–112)
Creat: 0.81 mg/dL (ref 0.50–1.10)
GFR, Est African American: >89 mL/min
GFR, Est Non African American: >89 mL/min
Glucose, Bld: 85 mg/dL (ref 70–99)
Potassium: 3.7 mEq/L (ref 3.5–5.3)
Sodium: 141 mEq/L (ref 135–145)
Total Bilirubin: 0.4 mg/dL (ref 0.3–1.2)
Total Protein: 7 g/dL (ref 6.0–8.3)

## 2012-10-06 ENCOUNTER — Telehealth: Payer: Self-pay | Admitting: *Deleted

## 2012-10-06 DIAGNOSIS — B373 Candidiasis of vulva and vagina: Secondary | ICD-10-CM

## 2012-10-06 DIAGNOSIS — B3731 Acute candidiasis of vulva and vagina: Secondary | ICD-10-CM

## 2012-10-06 MED ORDER — FLUCONAZOLE 150 MG PO TABS: 150.0000 mg | ORAL_TABLET | Freq: Once | ORAL | Status: DC

## 2012-10-06 NOTE — Telephone Encounter (Signed)
Pt called with c/o vaginal yeast infection a would like Diflucan called if possible.  Per VO Dr Marice Potter may call in 1 Diflucan if not any better in 3 days will need appt.

## 2012-12-05 ENCOUNTER — Encounter: Payer: Self-pay | Admitting: Advanced Practice Midwife

## 2012-12-05 ENCOUNTER — Ambulatory Visit (INDEPENDENT_AMBULATORY_CARE_PROVIDER_SITE_OTHER): Payer: Medicaid Other | Admitting: Advanced Practice Midwife

## 2012-12-05 VITALS — BP 187/91 | HR 58 | Temp 98.6°F | Resp 16 | Ht 67.0 in | Wt 198.0 lb

## 2012-12-05 DIAGNOSIS — I10 Essential (primary) hypertension: Secondary | ICD-10-CM | POA: Insufficient documentation

## 2012-12-05 DIAGNOSIS — Z124 Encounter for screening for malignant neoplasm of cervix: Secondary | ICD-10-CM

## 2012-12-05 DIAGNOSIS — Z01419 Encounter for gynecological examination (general) (routine) without abnormal findings: Secondary | ICD-10-CM

## 2012-12-05 DIAGNOSIS — Z30431 Encounter for routine checking of intrauterine contraceptive device: Secondary | ICD-10-CM

## 2012-12-05 NOTE — Progress Notes (Signed)
  Subjective:     Connie Mills is a 37 y.o. female and is here for a comprehensive physical exam and Pap. The patient reports no problems. She denies abnormal vaginal bleeding, vaginal itching/burning, urinary symptoms, h/a, dizziness, n/v, or fever/chills.  She has resumed sexual activity with Mirena IUD in place without difficulty. Hx of abnormal Pap with Leep 13 years ago with normal Paps in recent years.  Pt reports she has not taken her prescribed blood pressure medication today but usually takes it twice a day.  History   Social History  . Marital Status: Married    Spouse Name: N/A    Number of Children: N/A  . Years of Education: N/A   Occupational History  . Nurse tech [employer]   Social History Main Topics  . Smoking status: Never Smoker   . Smokeless tobacco: Never Used  . Alcohol Use: No  . Drug Use: No  . Sexually Active: Yes -- Female partner(s)     Comment: for tubal   Other Topics Concern  . Not on file   Social History Narrative  . No narrative on file   Health Maintenance  Topic Date Due  . Influenza Vaccine  07/17/2012  . Pap Smear  09/28/2014  . Tetanus/tdap  11/16/2020    The following portions of the patient's history were reviewed and updated as appropriate: allergies, current medications, past family history, past medical history, past social history, past surgical history and problem list.  Review of Systems Pertinent items are noted in HPI.   Objective:    BP 187/91  Pulse 58  Temp 98.6 F (37 C) (Oral)  Resp 16  Ht 5\' 7"  (1.702 m)  Wt 198 lb (89.812 kg)  BMI 31.01 kg/m2  LMP 11/14/2012  Breastfeeding? No General appearance: alert, cooperative, appears stated age and no distress Neck: no adenopathy, no carotid bruit, no JVD, supple, symmetrical, trachea midline and thyroid not enlarged, symmetric, no tenderness/mass/nodules Lungs: clear to auscultation bilaterally Breasts: normal appearance, no masses or tenderness, Inspection  negative Heart: regular rate and rhythm, S1, S2 normal, no murmur, click, rub or gallop Abdomen: soft, non-tender; bowel sounds normal; no masses,  no organomegaly Pelvic: cervix normal in appearance, external genitalia normal, no adnexal masses or tenderness, no cervical motion tenderness, rectovaginal septum normal, uterus normal size, shape, and consistency and vagina normal without discharge Extremities: extremities normal, atraumatic, no cyanosis or edema Skin: Skin color, texture, turgor normal. No rashes or lesions    Assessment:    Healthy female exam. Pap collected today.     Plan:     Take blood pressure medication today, and take BP at home daily x3-4 days F/U with primary care provider if remains elevated Return for annual well woman exam annually Pap frequency determined by Pap results today Recommend self check of Mirena string periodically Return to office PRN

## 2013-01-06 ENCOUNTER — Other Ambulatory Visit: Payer: Self-pay | Admitting: Family

## 2013-01-06 MED ORDER — FLUCONAZOLE 150 MG PO TABS: 150.0000 mg | ORAL_TABLET | Freq: Once | ORAL | Status: DC

## 2013-01-24 ENCOUNTER — Other Ambulatory Visit: Payer: Self-pay | Admitting: Family Medicine

## 2013-10-03 ENCOUNTER — Other Ambulatory Visit: Payer: Self-pay | Admitting: Family Medicine

## 2013-10-03 ENCOUNTER — Other Ambulatory Visit: Payer: Self-pay | Admitting: *Deleted

## 2013-10-03 ENCOUNTER — Other Ambulatory Visit: Payer: Self-pay | Admitting: Obstetrics & Gynecology

## 2013-10-03 MED ORDER — METOPROLOL SUCCINATE ER 50 MG PO TB24: ORAL_TABLET | ORAL | Status: DC

## 2013-10-03 MED ORDER — FELODIPINE ER 10 MG PO TB24: 10.0000 mg | ORAL_TABLET | Freq: Every day | ORAL | Status: DC

## 2013-10-27 ENCOUNTER — Encounter: Payer: Self-pay | Admitting: Family Medicine

## 2013-10-27 ENCOUNTER — Ambulatory Visit (INDEPENDENT_AMBULATORY_CARE_PROVIDER_SITE_OTHER): Payer: Medicaid Other | Admitting: Family Medicine

## 2013-10-27 VITALS — BP 187/103 | HR 76 | Temp 98.2°F | Ht 67.0 in | Wt 197.0 lb

## 2013-10-27 DIAGNOSIS — I10 Essential (primary) hypertension: Secondary | ICD-10-CM

## 2013-10-27 MED ORDER — METOPROLOL TARTRATE 100 MG PO TABS: 100.0000 mg | ORAL_TABLET | Freq: Two times a day (BID) | ORAL | Status: DC

## 2013-10-27 MED ORDER — METOPROLOL SUCCINATE ER 100 MG PO TB24: 100.0000 mg | ORAL_TABLET | Freq: Every day | ORAL | Status: DC

## 2013-10-27 MED ORDER — AMLODIPINE BESYLATE 10 MG PO TABS: 10.0000 mg | ORAL_TABLET | Freq: Every day | ORAL | Status: DC

## 2013-10-27 NOTE — Progress Notes (Addendum)
Subjective:    Patient ID: Connie Mills, female    DOB: 06/30/1976, 37 y.o.   MRN: [id number]  HPI HTN-  Pt denies chest pain, SOB, dizziness, or heart palpitations.  Taking meds as directed w/o problems.  Denies medication side effects.  She does not have health insurance and has not been in a while. She was originally sternal metoprolol for palpitations and has been having some breakthrough palpitations. She's under a lot of stress right now. Her son who was in college quit going to college and now is not motivated to work. Has really upset her. She's not sure what's going on with him as he is very bright Consulting civil engineer.   Review of Systems     Objective:   Physical Exam  Constitutional: She is oriented to person, place, and time. She appears well-developed and well-nourished.  HENT:  Head: Normocephalic and atraumatic.  Cardiovascular: Normal rate, regular rhythm and normal heart sounds.   Pulmonary/Chest: Effort normal and breath sounds normal.  Neurological: She is alert and oriented to person, place, and time.  Skin: Skin is warm and dry.  Psychiatric: She has a normal mood and affect. Her behavior is normal.          Assessment & Plan:  HTN - uncontrolled today she is tearful and upset today. I'm not sure really what her baseline is. We'll increase her metoprolol 100 mg since her blood pressures elevated today and she's been having some breakthrough palpitations. She does have a blood pressure cuff at home encouraged her to start checking her blood pressure to 3 times a week and write it down. I would like to see her back in a month. I did change her from metoprolol succinate metoprolol tartrate for cost reasons. Also changed her from felodipine to amlodipine for cost reasons.Marland Kitchen

## 2013-11-27 ENCOUNTER — Ambulatory Visit: Payer: Self-pay | Admitting: Family Medicine

## 2013-12-05 ENCOUNTER — Encounter: Payer: Self-pay | Admitting: Family Medicine

## 2013-12-05 ENCOUNTER — Ambulatory Visit (INDEPENDENT_AMBULATORY_CARE_PROVIDER_SITE_OTHER): Payer: Medicaid Other | Admitting: Family Medicine

## 2013-12-05 ENCOUNTER — Ambulatory Visit: Payer: Medicaid Other | Admitting: Family Medicine

## 2013-12-05 VITALS — BP 150/96 | HR 83 | Temp 98.7°F | Ht 67.0 in | Wt 198.0 lb

## 2013-12-05 DIAGNOSIS — I10 Essential (primary) hypertension: Secondary | ICD-10-CM

## 2013-12-05 MED ORDER — AMLODIPINE BESYLATE 10 MG PO TABS: 10.0000 mg | ORAL_TABLET | Freq: Every day | ORAL | Status: DC

## 2013-12-05 NOTE — Progress Notes (Signed)
Subjective:    Patient ID: Connie Mills, female    DOB: 1976/07/01, 38 y.o.   MRN: [id number]  HPI HTN - we changed her amlodipine form felodipine at last OV for cost reasons and changed her metoprolol to tartrate for cost reason.  She has been spitting he metoprolol because thought she was supposed ot take 100mg  total. No CP, SOB or S.E on medications.     Review of Systems     Objective:   Physical Exam  Constitutional: She is oriented to person, place, and time. She appears well-developed and well-nourished.  HENT:  Head: Normocephalic and atraumatic.  Cardiovascular: Normal rate, regular rhythm and normal heart sounds.   Pulmonary/Chest: Effort normal and breath sounds normal.  Neurological: She is alert and oriented to person, place, and time.  Skin: Skin is warm and dry.  Psychiatric: She has a normal mood and affect. Her behavior is normal.          Assessment & Plan:  HTN- Uncontrolled but significantly better than last month.  Increase metoprolol to whole tab twice a day and followup in 6 weeks to see if the blood pressure looks like it's at goal. F/U in 6 weeks for BP check. Says will go to lab next week. Continue to check blood pressures at home. After 2 weeks if she still seeing some elevated pressures plan call me and we can make an adjustment over the phone and at another agent if needed. She plans to go for bloodwork next week.

## 2013-12-07 NOTE — Telephone Encounter (Signed)
She will need to see her FP for BP meds. Thanks

## 2013-12-19 LAB — COMPLETE METABOLIC PANEL WITH GFR
ALT: 10 U/L (ref 0–35)
AST: 13 U/L (ref 0–37)
Albumin: 4.1 g/dL (ref 3.5–5.2)
Alkaline Phosphatase: 71 U/L (ref 39–117)
BUN: 13 mg/dL (ref 6–23)
CO2: 28 mEq/L (ref 19–32)
Calcium: 9.7 mg/dL (ref 8.4–10.5)
Chloride: 102 mEq/L (ref 96–112)
Creat: 0.76 mg/dL (ref 0.50–1.10)
GFR, Est African American: >89 mL/min
GFR, Est Non African American: >89 mL/min
Glucose, Bld: 83 mg/dL (ref 70–99)
Potassium: 3.6 mEq/L (ref 3.5–5.3)
Sodium: 137 mEq/L (ref 135–145)
Total Bilirubin: 0.4 mg/dL (ref 0.2–1.2)
Total Protein: 7.3 g/dL (ref 6.0–8.3)

## 2013-12-19 LAB — LIPID PANEL
Cholesterol: 188 mg/dL (ref 0–200)
HDL: 45 mg/dL (ref >39)
LDL Cholesterol: 131 mg/dL — ABNORMAL HIGH (ref 0–99)
Total CHOL/HDL Ratio: 4.2 Ratio
Triglycerides: 61 mg/dL (ref <150)
VLDL: 12 mg/dL (ref 0–40)

## 2013-12-28 ENCOUNTER — Emergency Department
Admission: EM | Admit: 2013-12-28 | Discharge: 2013-12-28 | Disposition: A | Discharge status: Home / Self Care | Payer: 59 | Source: Home / Self Care | Attending: Family Medicine | Admitting: Family Medicine

## 2013-12-28 ENCOUNTER — Encounter: Payer: Self-pay | Admitting: Emergency Medicine

## 2013-12-28 DIAGNOSIS — J069 Acute upper respiratory infection, unspecified: Secondary | ICD-10-CM

## 2013-12-28 NOTE — ED Notes (Signed)
Congestion, chills, sore throat, ears hurt x 2 days
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
## 2014-01-16 ENCOUNTER — Ambulatory Visit: Payer: 59 | Admitting: Family Medicine

## 2014-02-09 ENCOUNTER — Ambulatory Visit: Payer: 59 | Admitting: Family Medicine

## 2014-03-05 ENCOUNTER — Other Ambulatory Visit: Payer: Self-pay | Admitting: Family Medicine

## 2014-03-06 ENCOUNTER — Ambulatory Visit (INDEPENDENT_AMBULATORY_CARE_PROVIDER_SITE_OTHER): Payer: 59 | Admitting: Family Medicine

## 2014-03-06 ENCOUNTER — Encounter: Payer: Self-pay | Admitting: Family Medicine

## 2014-03-06 VITALS — BP 160/92 | HR 53 | Wt 196.0 lb

## 2014-03-06 DIAGNOSIS — I10 Essential (primary) hypertension: Secondary | ICD-10-CM

## 2014-03-06 DIAGNOSIS — E785 Hyperlipidemia, unspecified: Secondary | ICD-10-CM | POA: Insufficient documentation

## 2014-03-06 MED ORDER — TOPROL XL 100 MG PO TB24: 100.0000 mg | ORAL_TABLET | Freq: Every day | ORAL | Status: DC

## 2014-03-06 NOTE — Progress Notes (Signed)
Subjective:    Patient ID: Connie Mills, female    DOB: July 01, 1976, 38 y.o.   MRN: [id number]  HPI Hypertension- Pt denies chest pain, SOB, dizziness, or heart palpitations.  Taking meds as directed w/o problems.  Denies medication side effects.  She feels like she felt better on the extended release metoprolol. She does feel it is a difference between the generic in the brand. She just feels it hasn't been as well controlled. She was also speaking to get here today as she was on mostly after being at a dentist appointment. She is taking the amlodipine. Without any side effects or problems.  She would also like to go over her recent blood work that was done in February. She has some questions about her elevated cholesterol levels. She's not currently on any medication for this. Review of Systems     Objective:   Physical Exam  Constitutional: She is oriented to person, place, and time. She appears well-developed and well-nourished.  HENT:  Head: Normocephalic and atraumatic.  Cardiovascular: Normal rate, regular rhythm and normal heart sounds.   Pulmonary/Chest: Effort normal and breath sounds normal.  Neurological: She is alert and oriented to person, place, and time.  Skin: Skin is warm and dry.  Psychiatric: She has a normal mood and affect. Her behavior is normal.          Assessment & Plan:  HTN - uncontrolled today. Repeat was a little bit better but not at goal. We'll switch back to extended release metoprolol. She feels like there really is a difference between the generic in the brand. She prefers to go back on the brand. I will send it to the pharmacy. It will be  a little bit more costly. Continue amlodipine. Followup in 3-4 weeks to make sure the blood pressures well regulated and go from there. We'll also give her a copy of her recent blood work today.  Hyperlipidemia-her LDL was up this year compared to last year. Encouraged her to get back on track with exercise diet  and maintaining a healthy weight.

## 2014-03-08 ENCOUNTER — Telehealth: Payer: Self-pay | Admitting: *Deleted

## 2014-03-08 ENCOUNTER — Other Ambulatory Visit: Payer: Self-pay | Admitting: *Deleted

## 2014-03-08 MED ORDER — METOPROLOL SUCCINATE ER 100 MG PO TB24: 100.0000 mg | ORAL_TABLET | Freq: Every day | ORAL | Status: DC

## 2014-03-17 ENCOUNTER — Other Ambulatory Visit: Payer: Self-pay | Admitting: Family Medicine

## 2014-03-20 ENCOUNTER — Ambulatory Visit: Payer: 59 | Admitting: Family Medicine

## 2014-04-03 ENCOUNTER — Encounter: Payer: Self-pay | Admitting: Family Medicine

## 2014-04-03 ENCOUNTER — Ambulatory Visit (INDEPENDENT_AMBULATORY_CARE_PROVIDER_SITE_OTHER): Payer: 59 | Admitting: Family Medicine

## 2014-04-03 VITALS — BP 144/82 | HR 67 | Wt 196.0 lb

## 2014-04-03 DIAGNOSIS — I10 Essential (primary) hypertension: Secondary | ICD-10-CM

## 2014-04-03 DIAGNOSIS — R011 Cardiac murmur, unspecified: Secondary | ICD-10-CM | POA: Insufficient documentation

## 2014-04-03 MED ORDER — METOPROLOL SUCCINATE ER 100 MG PO TB24: 100.0000 mg | ORAL_TABLET | Freq: Every day | ORAL | Status: DC

## 2014-04-03 NOTE — Progress Notes (Signed)
Subjective:    Patient ID: Connie Mills, female    DOB: 04/04/1976, 38 y.o.   MRN: [id number]  HPI Hypertension- Pt denies chest pain, SOB, dizziness, or heart palpitations.  Taking meds as directed w/o problems.  Denies medication side effects.     Review of Systems     Objective:   Physical Exam  Constitutional: She is oriented to person, place, and time. She appears well-developed and well-nourished.  HENT:  Head: Normocephalic and atraumatic.  Cardiovascular: Normal rate, regular rhythm and normal heart sounds.   Pulmonary/Chest: Effort normal and breath sounds normal.  Neurological: She is alert and oriented to person, place, and time.  Skin: Skin is warm and dry.  Psychiatric: She has a normal mood and affect. Her behavior is normal.          Assessment & Plan:  HTN- well controlled on current regimen at home. Up a little today but she is very stressed today.  Having issues at home.    Heart murmur- will schedule for echo. Sounds like coming from aortic valve.   Had myocarditis about 5 years ago and had an echo.

## 2014-04-04 ENCOUNTER — Telehealth: Payer: Self-pay | Admitting: *Deleted

## 2014-04-04 NOTE — Telephone Encounter (Signed)
No PA required for 2D Echo w/o contrast.  Connie CoryMisty Mantaj Chamberlin, LPN

## 2014-04-11 ENCOUNTER — Ambulatory Visit (HOSPITAL_BASED_OUTPATIENT_CLINIC_OR_DEPARTMENT_OTHER): Payer: 59

## 2014-05-02 ENCOUNTER — Ambulatory Visit (HOSPITAL_BASED_OUTPATIENT_CLINIC_OR_DEPARTMENT_OTHER): Payer: 59

## 2014-05-11 ENCOUNTER — Other Ambulatory Visit: Payer: Self-pay

## 2014-05-11 MED ORDER — AMLODIPINE BESYLATE 10 MG PO TABS: 10.0000 mg | ORAL_TABLET | Freq: Every day | ORAL | Status: DC

## 2014-05-11 MED ORDER — METOPROLOL SUCCINATE ER 100 MG PO TB24: 100.0000 mg | ORAL_TABLET | Freq: Every day | ORAL | Status: DC

## 2014-05-16 ENCOUNTER — Ambulatory Visit (HOSPITAL_BASED_OUTPATIENT_CLINIC_OR_DEPARTMENT_OTHER)
Admission: RE | Admit: 2014-05-16 | Discharge: 2014-05-16 | Disposition: A | Discharge status: Home / Self Care | Payer: 59 | Source: Ambulatory Visit | Attending: Family Medicine | Admitting: Family Medicine

## 2014-05-16 DIAGNOSIS — I10 Essential (primary) hypertension: Secondary | ICD-10-CM | POA: Insufficient documentation

## 2014-05-16 DIAGNOSIS — E782 Mixed hyperlipidemia: Secondary | ICD-10-CM | POA: Insufficient documentation

## 2014-05-16 DIAGNOSIS — I079 Rheumatic tricuspid valve disease, unspecified: Secondary | ICD-10-CM | POA: Insufficient documentation

## 2014-05-16 DIAGNOSIS — I059 Rheumatic mitral valve disease, unspecified: Secondary | ICD-10-CM | POA: Insufficient documentation

## 2014-05-16 DIAGNOSIS — R011 Cardiac murmur, unspecified: Secondary | ICD-10-CM | POA: Insufficient documentation

## 2014-05-16 NOTE — Progress Notes (Signed)
Echocardiogram 2D Echocardiogram has been performed.  Connie BasemanReel, [name] M 05/16/2014, 12:06 PM

## 2014-06-15 ENCOUNTER — Telehealth: Payer: Self-pay

## 2014-06-15 NOTE — Telephone Encounter (Signed)
Pt called this morning and stated that she was having a lot of chest pressure, back pain and a headache with some nausea yesterday evening so EMS was called and [facility] She stated that the doctors came in and spoke with her and they were going to run some test and take some labs but the pain went away and they were slow so she stated that she left. But this morning the pain is back. She stated that she wants to make an appointment to come and see Dr. Linford ArnoldMetheney or come to Urgent Care and I informed her that she needs to go to the ER. Pt denies SOB, dizziness or weakness but the pain is localized in one area and she is having the back pain with nausea. She stated that it feels like a lot of pressure in her chest. I informed her that she needs to go to the ER and seek medical attention and she really needs to stay this time until they release her. Pt understood and stated that she would go./Shakeeta Godette,CMA

## 2014-06-15 NOTE — Telephone Encounter (Signed)
Agree with below. Addendum:    I believe that she has limits with moving and including, toileting, bathing, feeding, dressing and grooming. I believe the power wheelchair is needed for pt to be able to perform ADL's in her home

## 2014-08-03 ENCOUNTER — Other Ambulatory Visit: Payer: Self-pay | Admitting: Family Medicine

## 2014-08-07 ENCOUNTER — Encounter: Payer: Self-pay | Admitting: Family Medicine

## 2014-08-07 ENCOUNTER — Ambulatory Visit (INDEPENDENT_AMBULATORY_CARE_PROVIDER_SITE_OTHER): Payer: 59 | Admitting: Family Medicine

## 2014-08-07 VITALS — BP 142/82 | HR 78 | Wt 190.0 lb

## 2014-08-07 DIAGNOSIS — D508 Other iron deficiency anemias: Secondary | ICD-10-CM

## 2014-08-07 DIAGNOSIS — Z23 Encounter for immunization: Secondary | ICD-10-CM

## 2014-08-07 DIAGNOSIS — D509 Iron deficiency anemia, unspecified: Secondary | ICD-10-CM

## 2014-08-07 DIAGNOSIS — I10 Essential (primary) hypertension: Secondary | ICD-10-CM

## 2014-08-07 DIAGNOSIS — B002 Herpesviral gingivostomatitis and pharyngotonsillitis: Secondary | ICD-10-CM

## 2014-08-07 MED ORDER — FUSION PLUS PO CAPS: 1.0000 | ORAL_CAPSULE | Freq: Every day | ORAL | Status: DC

## 2014-08-07 MED ORDER — VALACYCLOVIR HCL 1 G PO TABS: 2000.0000 mg | ORAL_TABLET | Freq: Two times a day (BID) | ORAL | Status: DC

## 2014-08-07 MED ORDER — HYDROCHLOROTHIAZIDE 25 MG PO TABS: 25.0000 mg | ORAL_TABLET | Freq: Every day | ORAL | Status: DC

## 2014-08-07 NOTE — Assessment & Plan Note (Signed)
Patient prefers oral therapy. We'll send a prescription for Valtrex.

## 2014-08-07 NOTE — Assessment & Plan Note (Addendum)
Uncontrolled.  Will add HCTZ. Check bmp at g.u in 4-6 weeks.  Congratulate her on her weight loss of 6 pounds.

## 2014-08-07 NOTE — Assessment & Plan Note (Signed)
She says when she went to the hospital in July she was told that she still had low iron and was told to start a supplement. She says she just really doesn't tolerate the over-the-counter medications very well. We will try a prescription version called fusion plus. If it's too costly were not covered her on her insurance plan she can give [place] a call back and we can talk with the pharmacist about finding something similar. Recommend start with once a day since she's more sensitive to the iron.

## 2014-08-07 NOTE — Progress Notes (Signed)
Subjective:    Patient ID: Connie Mills, female    DOB: 1976-01-16, 38 y.o.   MRN: [id number]  Hypertension   Hypertension- Pt denies chest pain, SOB, dizziness, or heart palpitations.  Taking meds as directed w/o problems.  Denies medication side effects.  Went to ED in [place] in July for HA and chest pressure. Everything was normal. She feels it was related to stress.   Has lost 6 lbs over the last 4 months.She says has been from being stressed with her son. She also has a 70 year old daughter.   Fever blisters - she used abreva OTC.  She wants something Rx. That was her first breakout since [employer] School.  Review of Systems     Objective:   Physical Exam  Constitutional: She is oriented to person, place, and time. She appears well-developed and well-nourished.  HENT:  Head: Normocephalic and atraumatic.  Cardiovascular: Normal rate, regular rhythm and normal heart sounds.   Pulmonary/Chest: Effort normal and breath sounds normal.  Neurological: She is alert and oriented to person, place, and time.  Skin: Skin is warm and dry.  Psychiatric: She has a normal mood and affect. Her behavior is normal.          Assessment & Plan:

## 2014-09-03 ENCOUNTER — Other Ambulatory Visit: Payer: Self-pay | Admitting: *Deleted

## 2014-09-03 MED ORDER — HYDROCHLOROTHIAZIDE 25 MG PO TABS: 25.0000 mg | ORAL_TABLET | Freq: Every day | ORAL | Status: DC

## 2014-09-10 ENCOUNTER — Ambulatory Visit (INDEPENDENT_AMBULATORY_CARE_PROVIDER_SITE_OTHER): Payer: 59 | Admitting: Advanced Practice Midwife

## 2014-09-10 ENCOUNTER — Encounter: Payer: Self-pay | Admitting: Advanced Practice Midwife

## 2014-09-10 VITALS — BP 140/86 | HR 63 | Resp 16 | Ht 67.0 in | Wt 188.0 lb

## 2014-09-10 DIAGNOSIS — Z975 Presence of (intrauterine) contraceptive device: Secondary | ICD-10-CM

## 2014-09-10 DIAGNOSIS — Z124 Encounter for screening for malignant neoplasm of cervix: Secondary | ICD-10-CM

## 2014-09-10 DIAGNOSIS — Z01419 Encounter for gynecological examination (general) (routine) without abnormal findings: Secondary | ICD-10-CM

## 2014-09-10 DIAGNOSIS — Z1151 Encounter for screening for human papillomavirus (HPV): Secondary | ICD-10-CM

## 2014-09-10 NOTE — Patient Instructions (Signed)

## 2014-09-10 NOTE — Progress Notes (Signed)
  Subjective:     Connie Mills is a 38 y.o. female here for a routine exam.  Current complaints: none.  Personal health questionnaire reviewed: not asked.   Gynecologic History Patient's last menstrual period was 08/28/2014. Contraception: IUD Last Pap: 2014. Results were: normal Last mammogram: none.  Having regular periods, heavy.  Obstetric History OB History  Gravida Para Term Preterm AB SAB TAB Ectopic Multiple Living  3 2 2  1 1  0 0 0 2    # Outcome Date GA Lbr Len/2nd Weight Sex Delivery Anes PTL Lv  3 TRM 04/29/12 [id number] 05:06 / 02:25 5 lb 10 oz (2.551 kg) F VBAC, Vacuum EPI  Y     Comments: caput  2 SAB 07/28/11          1 TRM 05/16/92 2123w0d  7 lb (3.175 kg) M CS EPI N Y     Comments: breech       The following portions of the patient's history were reviewed and updated as appropriate: allergies, current medications, past family history, past medical history, past social history, past surgical history and problem list.  Review of Systems Pertinent items are noted in HPI.    Objective:    BP 140/86  Pulse 63  Resp 16  Ht 5\' 7"  (1.702 m)  Wt 188 lb (85.276 kg)  BMI 29.44 kg/m2  LMP 08/28/2014 General appearance: alert, cooperative and no distress Back: symmetric, no curvature. ROM normal. No CVA tenderness. Lungs: clear to auscultation bilaterally Breasts: normal appearance, no masses or tenderness Heart: regular rate and rhythm, S1, S2 normal, no murmur, click, rub or gallop Abdomen: soft, non-tender; bowel sounds normal; no masses,  no organomegaly Pelvic: cervix normal in appearance, external genitalia normal, no adnexal masses or tenderness, no cervical motion tenderness, rectovaginal septum normal, uterus normal size, shape, and consistency, vagina normal without discharge and IUD string visible    Assessment:    Healthy female exam.    Plan:    Education reviewed: self breast exams. Contraception: IUD. Follow up in: 1 year.

## 2014-09-11 ENCOUNTER — Ambulatory Visit: Payer: 59 | Admitting: Family Medicine

## 2014-09-11 LAB — CYTOLOGY - PAP

## 2014-09-15 ENCOUNTER — Encounter (HOSPITAL_COMMUNITY): Payer: Self-pay | Admitting: Advanced Practice Midwife

## 2014-09-15 DIAGNOSIS — R87610 Atypical squamous cells of undetermined significance on cytologic smear of cervix (ASC-US): Secondary | ICD-10-CM | POA: Insufficient documentation

## 2014-09-17 ENCOUNTER — Encounter: Payer: Self-pay | Admitting: *Deleted

## 2014-09-17 ENCOUNTER — Telehealth: Payer: Self-pay | Admitting: *Deleted

## 2014-09-17 ENCOUNTER — Encounter: Payer: Self-pay | Admitting: Advanced Practice Midwife

## 2014-09-17 NOTE — Telephone Encounter (Signed)
Pt notified of results of pap and recommendations per Artelia LarocheM Harbold, CNM.

## 2014-09-17 NOTE — Telephone Encounter (Signed)
-----   Message from Aviva SignsMarie L Millican, CNM sent at 09/15/2014  9:04 PM EDT ----- Regarding: pap letter ASCUS pap but HPV negative  Repeat with cotesting in 3 yrs but we recommend annual exam yearly anyway, even if not doing pap

## 2014-09-21 ENCOUNTER — Other Ambulatory Visit: Payer: Self-pay | Admitting: Family Medicine

## 2014-10-15 ENCOUNTER — Other Ambulatory Visit: Payer: Self-pay | Admitting: Family Medicine

## 2014-11-02 ENCOUNTER — Other Ambulatory Visit: Payer: Self-pay | Admitting: Family Medicine

## 2014-11-02 NOTE — Telephone Encounter (Signed)
Needs appointment before future refills.

## 2014-11-06 ENCOUNTER — Other Ambulatory Visit: Payer: Self-pay

## 2014-11-06 MED ORDER — AMLODIPINE BESYLATE 10 MG PO TABS: ORAL_TABLET | ORAL | Status: DC

## 2014-11-13 ENCOUNTER — Encounter: Payer: Self-pay | Admitting: Family Medicine

## 2014-11-13 ENCOUNTER — Ambulatory Visit (INDEPENDENT_AMBULATORY_CARE_PROVIDER_SITE_OTHER): Payer: 59 | Admitting: Family Medicine

## 2014-11-13 VITALS — BP 130/82 | HR 57 | Ht 67.0 in | Wt 187.0 lb

## 2014-11-13 DIAGNOSIS — I10 Essential (primary) hypertension: Secondary | ICD-10-CM

## 2014-11-13 MED ORDER — METOPROLOL SUCCINATE ER 100 MG PO TB24: ORAL_TABLET | ORAL | Status: DC

## 2014-11-13 MED ORDER — FLUCONAZOLE 150 MG PO TABS: 150.0000 mg | ORAL_TABLET | Freq: Once | ORAL | Status: DC

## 2014-11-13 MED ORDER — AMLODIPINE BESYLATE 10 MG PO TABS: ORAL_TABLET | ORAL | Status: DC

## 2014-11-13 NOTE — Progress Notes (Signed)
Subjective:    Patient ID: Connie Mills, female    DOB: June 14, 1976, 38 y.o.   MRN: [id number]  HPI Hypertension- Pt denies chest pain, SOB, dizziness, or heart palpitations.  Taking meds as directed w/o problems.  Denies medication side effects.  We start the hydrochlorothiazide almost 3 months ago. It was giving her some headaches that she cut in half but has otherwise tolerated it well since then.  Review of Systems     Objective:   Physical Exam  Constitutional: She is oriented to person, place, and time. She appears well-developed and well-nourished.  HENT:  Head: Normocephalic and atraumatic.  Cardiovascular: Normal rate, regular rhythm and normal heart sounds.   Pulmonary/Chest: Effort normal and breath sounds normal.  Neurological: She is alert and oriented to person, place, and time.  Skin: Skin is warm and dry.  Psychiatric: She has a normal mood and affect. Her behavior is normal.          Assessment & Plan:  Hypertension-well-controlled today. Looks fantastic. Due for BMP to check kidney function and potassium. She was actually supposed to have this done 2 months ago. Follow-up in 6 months.

## 2014-11-14 LAB — BASIC METABOLIC PANEL WITH GFR
BUN: 14 mg/dL (ref 6–23)
CO2: 31 mEq/L (ref 19–32)
Calcium: 9.4 mg/dL (ref 8.4–10.5)
Chloride: 105 mEq/L (ref 96–112)
Creat: 0.9 mg/dL (ref 0.50–1.10)
GFR, Est African American: >89 mL/min
GFR, Est Non African American: 80 mL/min
Glucose, Bld: 91 mg/dL (ref 70–99)
Potassium: 3.8 mEq/L (ref 3.5–5.3)
Sodium: 139 mEq/L (ref 135–145)

## 2014-11-14 NOTE — Progress Notes (Signed)
Quick Note:  All labs are normal. ______ 

## 2014-11-29 ENCOUNTER — Encounter (HOSPITAL_COMMUNITY): Payer: Self-pay | Admitting: Obstetrics & Gynecology

## 2015-04-25 ENCOUNTER — Encounter (HOSPITAL_COMMUNITY): Payer: Self-pay | Admitting: Obstetrics & Gynecology

## 2015-05-03 ENCOUNTER — Other Ambulatory Visit: Payer: Self-pay | Admitting: Family Medicine

## 2015-05-10 ENCOUNTER — Ambulatory Visit: Payer: Self-pay | Admitting: Family Medicine

## 2015-05-16 ENCOUNTER — Ambulatory Visit: Payer: 59 | Admitting: Family Medicine

## 2015-05-21 ENCOUNTER — Other Ambulatory Visit: Payer: Self-pay | Admitting: Family Medicine

## 2015-06-10 ENCOUNTER — Encounter: Payer: Self-pay | Admitting: Family Medicine

## 2015-06-10 ENCOUNTER — Ambulatory Visit (INDEPENDENT_AMBULATORY_CARE_PROVIDER_SITE_OTHER): Payer: 59 | Admitting: Family Medicine

## 2015-06-10 VITALS — BP 140/78 | HR 58 | Ht 67.0 in | Wt 174.0 lb

## 2015-06-10 DIAGNOSIS — I10 Essential (primary) hypertension: Secondary | ICD-10-CM

## 2015-06-10 DIAGNOSIS — N946 Dysmenorrhea, unspecified: Secondary | ICD-10-CM | POA: Diagnosis not present

## 2015-06-10 MED ORDER — CELECOXIB 200 MG PO CAPS
200.0000 mg | ORAL_CAPSULE | Freq: Two times a day (BID) | ORAL | Status: DC
Start: 2015-06-10 — End: 2015-11-10

## 2015-06-10 NOTE — Progress Notes (Signed)
Subjective:    Patient ID: Connie Mills, female    DOB: [birth year], 39 y.o.   MRN: [id number]  HPI  Hypertension- Pt denies chest pain, SOB, dizziness, or heart palpitations.  Taking meds as directed w/o problems.  Denies medication side effects.   BP was 140/80s. Mom with hx of stroke.   She also has very heavy cramping with her  menstrual cycles. Since she had the Mirena put in. Slight bleeding is no longer heavy but she still gets significant cramping. She's tried multiple products over-the-counter including Midol, Motrin, Aleve, Tylenol etc. They really don't touch the pain. Even 800 mg ibuprofen only helps for about an hour. No prior history of fibroid tumors etc.  Review of Systems     Objective:   Physical Exam  Constitutional: She is oriented to person, place, and time. She appears well-developed and well-nourished.  HENT:  Head: Normocephalic and atraumatic.  Cardiovascular: Normal rate and regular rhythm.   Murmur heard. 2/6 SEM.   Pulmonary/Chest: Effort normal and breath sounds normal.  Neurological: She is alert and oriented to person, place, and time.  Skin: Skin is warm and dry.  Psychiatric: She has a normal mood and affect. Her behavior is normal.          Assessment & Plan:  HTN -  Uncontrolled today.  She will restart the HCTZ every other days. Says didn't do well when was taking it daily.  Consider lotrel if not at goal.    heavy cramping during menstrual cycle- she has a Mirena IUD in place. She's tried multiple different NSAIDs. We could try Cox 2 inhibitor  P recommend start 2 days before her cycle and continue at least 3 days into her cycle. If not helping then I recommend she follow-up with her GYN for further evaluation for fibroid is etc.

## 2015-07-05 ENCOUNTER — Ambulatory Visit: Payer: 59 | Admitting: Family Medicine

## 2015-07-18 ENCOUNTER — Ambulatory Visit (INDEPENDENT_AMBULATORY_CARE_PROVIDER_SITE_OTHER): Payer: 59 | Admitting: Family Medicine

## 2015-07-18 ENCOUNTER — Encounter: Payer: Self-pay | Admitting: Family Medicine

## 2015-07-18 VITALS — BP 140/82 | HR 61 | Ht 67.0 in | Wt 173.0 lb

## 2015-07-18 DIAGNOSIS — M40293 Other kyphosis, cervicothoracic region: Secondary | ICD-10-CM | POA: Diagnosis not present

## 2015-07-18 DIAGNOSIS — I10 Essential (primary) hypertension: Secondary | ICD-10-CM | POA: Diagnosis not present

## 2015-07-18 MED ORDER — METOPROLOL SUCCINATE ER 100 MG PO TB24: ORAL_TABLET | ORAL | Status: DC

## 2015-07-18 MED ORDER — AMLODIPINE BESY-BENAZEPRIL HCL 10-20 MG PO CAPS: 1.0000 | ORAL_CAPSULE | Freq: Every day | ORAL | Status: DC
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
## 2015-09-02 ENCOUNTER — Ambulatory Visit: Payer: 59 | Admitting: Family Medicine

## 2015-09-20 ENCOUNTER — Encounter: Payer: Self-pay | Admitting: Family Medicine

## 2015-09-20 ENCOUNTER — Ambulatory Visit (INDEPENDENT_AMBULATORY_CARE_PROVIDER_SITE_OTHER): Payer: 59 | Admitting: Family Medicine

## 2015-09-20 VITALS — BP 138/90 | HR 58 | Temp 98.7°F | Resp 18 | Wt 173.3 lb

## 2015-09-20 DIAGNOSIS — I10 Essential (primary) hypertension: Secondary | ICD-10-CM | POA: Diagnosis not present

## 2015-09-20 NOTE — Progress Notes (Signed)
Subjective:    Patient ID: Connie Mills, female    DOB: [birth year], 39 y.o.   MRN: [id number]  HPI Hypertension- Pt denies chest pain, SOB, dizziness, or heart palpitations.  Taking meds as directed w/o problems.  Denies medication side effects.  We had recently discontinued her Hydrocort Dyazide because she wasn't feeling well on it and put her on Lotrel. She says she hasn't had any side effects or problems and feels great on it and at home her blood pressures have been running in the low 120s systolic. She said she hasn't seen blood pressures not good in a long time. She's very happy with her new regimen.   Review of Systems     Objective:   Physical Exam  Constitutional: She is oriented to person, place, and time. She appears well-developed and well-nourished.  HENT:  Head: Normocephalic and atraumatic.  Cardiovascular: Normal rate, regular rhythm and normal heart sounds.   Pulmonary/Chest: Effort normal and breath sounds normal.  Neurological: She is alert and oriented to person, place, and time.  Skin: Skin is warm and dry.  Psychiatric: She has a normal mood and affect. Her behavior is normal.          Assessment & Plan:  HTN - well controlled. Continue current regimen. Follow-up in 6 months. Reminded her to get her labwork.

## 2015-10-06 ENCOUNTER — Other Ambulatory Visit: Payer: Self-pay | Admitting: Family Medicine

## 2015-10-07 ENCOUNTER — Other Ambulatory Visit: Payer: Self-pay

## 2015-10-07 ENCOUNTER — Telehealth: Payer: Self-pay

## 2015-10-07 NOTE — Telephone Encounter (Signed)
Refill was discontinued but it was reordered due to patient request for outbreak. Connie Mills,CMA

## 2015-10-08 NOTE — Telephone Encounter (Signed)
Error

## 2015-10-15 ENCOUNTER — Other Ambulatory Visit: Payer: Self-pay | Admitting: Family Medicine

## 2015-10-30 ENCOUNTER — Ambulatory Visit: Payer: 59 | Admitting: Obstetrics & Gynecology

## 2015-11-10 ENCOUNTER — Other Ambulatory Visit: Payer: Self-pay | Admitting: Family Medicine

## 2016-01-02 ENCOUNTER — Other Ambulatory Visit: Payer: Self-pay | Admitting: Family Medicine

## 2016-02-05 ENCOUNTER — Other Ambulatory Visit: Payer: Self-pay | Admitting: Family Medicine

## 2016-02-07 ENCOUNTER — Encounter: Payer: Self-pay | Admitting: Advanced Practice Midwife

## 2016-02-07 ENCOUNTER — Ambulatory Visit (INDEPENDENT_AMBULATORY_CARE_PROVIDER_SITE_OTHER): Payer: 59 | Admitting: Advanced Practice Midwife

## 2016-02-07 VITALS — BP 190/93 | HR 70 | Resp 16 | Ht 67.0 in | Wt 177.0 lb

## 2016-02-07 DIAGNOSIS — E049 Nontoxic goiter, unspecified: Secondary | ICD-10-CM

## 2016-02-07 DIAGNOSIS — Z01419 Encounter for gynecological examination (general) (routine) without abnormal findings: Secondary | ICD-10-CM

## 2016-02-07 DIAGNOSIS — E010 Iodine-deficiency related diffuse (endemic) goiter: Secondary | ICD-10-CM

## 2016-02-07 DIAGNOSIS — I10 Essential (primary) hypertension: Secondary | ICD-10-CM | POA: Diagnosis not present

## 2016-02-07 DIAGNOSIS — Z124 Encounter for screening for malignant neoplasm of cervix: Secondary | ICD-10-CM

## 2016-02-07 DIAGNOSIS — Z1151 Encounter for screening for human papillomavirus (HPV): Secondary | ICD-10-CM

## 2016-02-07 DIAGNOSIS — B3731 Acute candidiasis of vulva and vagina: Secondary | ICD-10-CM

## 2016-02-07 DIAGNOSIS — Z30431 Encounter for routine checking of intrauterine contraceptive device: Secondary | ICD-10-CM

## 2016-02-07 DIAGNOSIS — B373 Candidiasis of vulva and vagina: Secondary | ICD-10-CM | POA: Diagnosis not present

## 2016-02-07 MED ORDER — FLUCONAZOLE 150 MG PO TABS: 150.0000 mg | ORAL_TABLET | Freq: Once | ORAL | Status: DC

## 2016-02-07 NOTE — Patient Instructions (Signed)
Hypertension Hypertension, commonly called high blood pressure, is when the force of blood pumping through your arteries is too strong. Your arteries are the blood vessels that carry blood from your heart throughout your body. A blood pressure reading consists of a higher number over a lower number, such as 110/72. The higher number (systolic) is the pressure inside your arteries when your heart pumps. The lower number (diastolic) is the pressure inside your arteries when your heart relaxes. Ideally you want your blood pressure below 120/80. Hypertension forces your heart to work harder to pump blood. Your arteries may become narrow or stiff. Having untreated or uncontrolled hypertension can cause heart attack, stroke, kidney disease, and other problems. RISK FACTORS Some risk factors for high blood pressure are controllable. Others are not.  Risk factors you cannot control include:   Race. You may be at higher risk if you are African American.  Age. Risk increases with age.  Gender. Men are at higher risk than women before age 45 years. After age 65, women are at higher risk than men. Risk factors you can control include:  Not getting enough exercise or physical activity.  Being overweight.  Getting too much fat, sugar, calories, or salt in your diet.  Drinking too much alcohol. SIGNS AND SYMPTOMS Hypertension does not usually cause signs or symptoms. Extremely high blood pressure (hypertensive crisis) may cause headache, anxiety, shortness of breath, and nosebleed. DIAGNOSIS To check if you have hypertension, your health care provider will measure your blood pressure while you are seated, with your arm held at the level of your heart. It should be measured at least twice using the same arm. Certain conditions can cause a difference in blood pressure between your right and left arms. A blood pressure reading that is higher than normal on one occasion does not mean that you need treatment. If  it is not clear whether you have high blood pressure, you may be asked to return on a different day to have your blood pressure checked again. Or, you may be asked to monitor your blood pressure at home for 1 or more weeks. TREATMENT Treating high blood pressure includes making lifestyle changes and possibly taking medicine. Living a healthy lifestyle can help lower high blood pressure. You may need to change some of your habits. Lifestyle changes may include:  Following the DASH diet. This diet is high in fruits, vegetables, and whole grains. It is low in salt, red meat, and added sugars.  Keep your sodium intake below 2,300 mg per day.  Getting at least 30-45 minutes of aerobic exercise at least 4 times per week.  Losing weight if necessary.  Not smoking.  Limiting alcoholic beverages.  Learning ways to reduce stress. Your health care provider may prescribe medicine if lifestyle changes are not enough to get your blood pressure under control, and if one of the following is true:  You are 18-59 years of age and your systolic blood pressure is above 140.  You are 60 years of age or older, and your systolic blood pressure is above 150.  Your diastolic blood pressure is above 90.  You have diabetes, and your systolic blood pressure is over 140 or your diastolic blood pressure is over 90.  You have kidney disease and your blood pressure is above 140/90.  You have heart disease and your blood pressure is above 140/90. Your personal target blood pressure may vary depending on your medical conditions, your age, and other factors. HOME CARE INSTRUCTIONS    Have your blood pressure rechecked as directed by your health care provider.   Take medicines only as directed by your health care provider. Follow the directions carefully. Blood pressure medicines must be taken as prescribed. The medicine does not work as well when you skip doses. Skipping doses also puts you at risk for  problems.  Do not smoke.   Monitor your blood pressure at home as directed by your health care provider. SEEK MEDICAL CARE IF:   You think you are having a reaction to medicines taken.  You have recurrent headaches or feel dizzy.  You have swelling in your ankles.  You have trouble with your vision. SEEK IMMEDIATE MEDICAL CARE IF:  You develop a severe headache or confusion.  You have unusual weakness, numbness, or feel faint.  You have severe chest or abdominal pain.  You vomit repeatedly.  You have trouble breathing. MAKE SURE YOU:   Understand these instructions.  Will watch your condition.  Will get help right away if you are not doing well or get worse.   This information is not intended to replace advice given to you by your health care provider. Make sure you discuss any questions you have with your health care provider.   Document Released: 11/02/2005 Document Revised: 03/19/2015 Document Reviewed: 08/25/2013 Elsevier Interactive Patient Education 2016 Elsevier Inc.  

## 2016-02-07 NOTE — Progress Notes (Signed)
Patient ID: Connie Mills, female   DOB: 08/24/1976, 40 y.o.   MRN: [id number] I spoke to Dr. Michaelle BirksMethany, pt's PCP, who verifies pt is asymptomatic and states pt is fine to leave office but needs a nurse visit to recheck BP in about a week. I have advised pt to call PCP and make appt. Pt expressed understanding.

## 2016-02-09 NOTE — Progress Notes (Signed)
Patient ID: Connie Mills, female   DOB: [birth year], 40 y.o.   MRN: [id number]  Chief Complaint  Patient presents with  . Gynecologic Exam   Gynecologic Exam The patient's primary symptoms include genital itching. The patient's pertinent negatives include no genital lesions, genital odor, missed menses, pelvic pain, vaginal bleeding or vaginal discharge. Primary symptoms comment: After periods. Resolved with Monistat, but would like oral medication. Pertinent negatives include no abdominal pain, chills, dysuria, fever or headaches. She is sexually active. No, her partner does not have an STD. She uses an IUD for contraception. Her menstrual history has been regular. Her past medical history is significant for a Cesarean section and miscarriage.   Connie Mills is a 40 y.o. female.  Here for annual exam and Pap. Also wishes to schedule mammogram. Patient's last menstrual period was 01/19/2016. states periods are regular and light since having range IUD placed in 2013. Last Pap 2015. Abnormal squamous cells of undetermined significance. Negative HPV. History of LEEP in 2005. Has never had a mammogram. Denies any family history of breast cancer.   History of chronic hypertension on Lotrel. States she did not take it this morning because she was in a hurry and didn't have any food on her stomach, but normally takes it every day as directed. This followed by Dr. Linford Arnold.  OB History    Gravida Para Term Preterm AB TAB SAB Ectopic Multiple Living   3 2 2  1  0 1 0 0 2       Past Medical History  Diagnosis Date  . Blood transfusion complicating pregnancy 1993  . Abnormal Pap smear of cervix 2005    Leep; normal pap after  . Anemia   . Hypertension   . AMA (advanced maternal age) multigravida 35+   . Blood transfusion May 16, 1992    "busted blood vessel behind incision", led to bleeding.    Past Surgical History  Procedure Laterality Date  . Wisdom tooth extraction  2003  . Cervical  biopsy  w/ loop electrode excision    . Cesarean section  1993     Breech, blood loss transfusion with "busted blood vessels behind incision"    Family History  Problem Relation Age of Onset  . Hypertension Mother   . Hypertension Brother   . Diabetes Maternal Grandmother   . Cancer Paternal Grandfather   . Hypertension Sister   . Thyroid disease Sister   . Thyroid disease Maternal Aunt   . Other Neg Hx     Social History Social History  Substance Use Topics  . Smoking status: Never Smoker   . Smokeless tobacco: Never Used  . Alcohol Use: No    Allergies  Allergen Reactions  . Dilaudid [Hydromorphone Hcl] Itching  . Hctz [Hydrochlorothiazide] Other (See Comments)    Doesn't feel well on it    Current Outpatient Prescriptions  Medication Sig Dispense Refill  . amLODipine-benazepril (LOTREL) 10-20 MG per capsule Take 1 capsule by mouth daily. 90 capsule 1  . celecoxib (CELEBREX) 200 MG capsule TAKE 1 CAPSULE (200 MG TOTAL) BY MOUTH 2 (TWO) TIMES DAILY. 60 capsule 1  . ibuprofen (ADVIL,MOTRIN) 600 MG tablet Take 600 mg by mouth every 6 (six) hours as needed.    . Iron-FA-B Cmp-C-Biot-Probiotic (FUSION PLUS) CAPS TAKE 1 CAPSULE BY MOUTH DAILY. 30 capsule 3  . levonorgestrel (MIRENA) 20 MCG/24HR IUD 1 each by Intrauterine route once.    . metoprolol succinate (TOPROL-XL) 100 MG 24 hr tablet TAKE 1  TABLET (100 MG TOTAL) BY MOUTH DAILY. TAKE WITH OR IMMEDIATELY FOLLOWING A MEAL. 90 tablet 1  . triamcinolone cream (KENALOG) 0.1 % Apply topically 2 (two) times daily.  0  . valACYclovir (VALTREX) 1000 MG tablet TAKE 2 TABLETS (2,000 MG TOTAL) BY MOUTH 2 (TWO) TIMES DAILY. FOR 1 DAY 20 tablet 5  . fluconazole (DIFLUCAN) 150 MG tablet Take 1 tablet (150 mg total) by mouth once. Can take additional dose three days later if symptoms persist 6 tablet 3  . [DISCONTINUED] methyldopa (ALDOMET) 250 MG tablet Take 250 mg by mouth 3 (three) times daily.      No current facility-administered  medications for this visit.    Review of Systems Review of Systems  Constitutional: Negative for fever and chills.  Respiratory: Negative for chest tightness and shortness of breath.   Cardiovascular: Negative for chest pain.  Gastrointestinal: Negative for abdominal pain.  Genitourinary: Negative for dysuria, vaginal bleeding, vaginal discharge, menstrual problem, pelvic pain and missed menses.  Neurological: Negative for dizziness, speech difficulty and headaches.    Blood pressure 190/93, pulse 70, resp. rate 16, height 5\' 7"  (1.702 m), weight 177 lb (80.287 kg), last menstrual period 01/19/2016.  Physical Exam Physical Exam  Constitutional: She is oriented to person, place, and time. She appears well-developed and well-nourished. No distress.  HENT:  Head: Normocephalic.  Mouth/Throat: Normal dentition.  Eyes: Conjunctivae are normal.  Neck: Thyromegaly present.  Cardiovascular: Normal rate and regular rhythm.  Exam reveals no gallop and no friction rub.   Murmur heard. Pulmonary/Chest: Effort normal and breath sounds normal. No respiratory distress. She has no wheezes.  Abdominal: Soft. Bowel sounds are normal. She exhibits no distension and no mass. There is no tenderness. There is no rebound and no guarding.  Genitourinary: Vagina normal and uterus normal. No vaginal discharge found.  IUD strings visualized  Musculoskeletal: Normal range of motion. She exhibits no edema or tenderness.  Neurological: She is alert and oriented to person, place, and time. She has normal reflexes.  Skin: Skin is warm and dry. She is not diaphoretic.  Psychiatric: She has a normal mood and affect.    Data Reviewed Pap 2015  Assessment   1. Well woman exam with routine gynecological exam  - Cytology - PAP  2. Vulvovaginal candidiasis  - fluconazole (DIFLUCAN) 150 MG tablet; Take 1 tablet (150 mg total) by mouth once. Can take additional dose three days later if symptoms persist   Dispense: 6 tablet; Refill: 3   3. Chronic hypertension--severe-range, but without evidence of end organ involvement  - Call Dr. Shelah Lewandowsky office to discuss severely elevated blood pressures. Patient is okay to go home, but was instructed to take medication on schedule and come to office for blood pressure check in one week.  - Hypertension red flags reviewed - Go to [facility] ED for chest pain shortness of breath, severe headache, weakness, difficulties with speech or gait   4. Thyromegaly  - TSH at PCP office  Plan Patient will like to have TSH drawn at primary care provider's office. Patient was to verify mammogram coverage with insurance company and will call to schedule mammogram. Follow-up in one year for annual exam or sooner as needed  Dorathy Kinsman 02/09/2016, 3:46 AM

## 2016-02-11 ENCOUNTER — Telehealth: Payer: Self-pay | Admitting: *Deleted

## 2016-02-11 LAB — CYTOLOGY - PAP

## 2016-02-11 NOTE — Telephone Encounter (Signed)
-----   Message from Connie Mills, [place] sent at 02/11/2016  1:05 PM EDT ----- Please inform pt of normal Pap. Neg HPV. Pap w/ co-testing in 5 years.

## 2016-02-11 NOTE — Telephone Encounter (Signed)
LM on voicemail of neg Pap and will repeat in 5 years per Connie Mills, CNM

## 2016-03-19 ENCOUNTER — Other Ambulatory Visit: Payer: Self-pay | Admitting: Family Medicine

## 2016-03-20 ENCOUNTER — Ambulatory Visit (INDEPENDENT_AMBULATORY_CARE_PROVIDER_SITE_OTHER): Payer: 59 | Admitting: Family Medicine

## 2016-03-20 ENCOUNTER — Encounter: Payer: Self-pay | Admitting: Family Medicine

## 2016-03-20 VITALS — BP 146/76 | HR 46 | Wt 181.0 lb

## 2016-03-20 DIAGNOSIS — I10 Essential (primary) hypertension: Secondary | ICD-10-CM

## 2016-03-20 DIAGNOSIS — B002 Herpesviral gingivostomatitis and pharyngotonsillitis: Secondary | ICD-10-CM | POA: Diagnosis not present

## 2016-03-20 DIAGNOSIS — Z8349 Family history of other endocrine, nutritional and metabolic diseases: Secondary | ICD-10-CM

## 2016-03-20 DIAGNOSIS — E785 Hyperlipidemia, unspecified: Secondary | ICD-10-CM | POA: Diagnosis not present

## 2016-03-20 LAB — COMPLETE METABOLIC PANEL WITH GFR
ALT: 10 U/L (ref 6–29)
AST: 12 U/L (ref 10–30)
Albumin: 4.2 g/dL (ref 3.6–5.1)
Alkaline Phosphatase: 40 U/L (ref 33–115)
BUN: 14 mg/dL (ref 7–25)
CO2: 24 mmol/L (ref 20–31)
Calcium: 9.3 mg/dL (ref 8.6–10.2)
Chloride: 104 mmol/L (ref 98–110)
Creat: 0.8 mg/dL (ref 0.50–1.10)
GFR, Est African American: >89 mL/min (ref >=60)
GFR, Est Non African American: >89 mL/min (ref >=60)
Glucose, Bld: 78 mg/dL (ref 65–99)
Potassium: 4.1 mmol/L (ref 3.5–5.3)
Sodium: 139 mmol/L (ref 135–146)
Total Bilirubin: 0.3 mg/dL (ref 0.2–1.2)
Total Protein: 7 g/dL (ref 6.1–8.1)

## 2016-03-20 LAB — LIPID PANEL
Cholesterol: 181 mg/dL (ref 125–200)
HDL: 63 mg/dL (ref >=46)
LDL Cholesterol: 108 mg/dL (ref <130)
Total CHOL/HDL Ratio: 2.9 Ratio (ref <=5.0)
Triglycerides: 48 mg/dL (ref <150)
VLDL: 10 mg/dL (ref <30)

## 2016-03-20 LAB — TSH: TSH: 1.04 mIU/L

## 2016-03-20 MED ORDER — FLUTICASONE PROPIONATE 50 MCG/ACT NA SUSP: 2.0000 | Freq: Every day | NASAL | Status: DC

## 2016-03-20 MED ORDER — CELECOXIB 200 MG PO CAPS: ORAL_CAPSULE | ORAL | Status: DC

## 2016-03-20 MED ORDER — VALACYCLOVIR HCL 1 G PO TABS: ORAL_TABLET | ORAL | Status: DC

## 2016-03-20 MED ORDER — CHLORTHALIDONE 25 MG PO TABS: 25.0000 mg | ORAL_TABLET | ORAL | Status: DC

## 2016-03-20 NOTE — Progress Notes (Signed)
Subjective:    CC: HTN  HPI:  Hypertension- Pt denies chest pain, SOB, dizziness, or heart palpitations.  Taking meds as directed w/o problems.  Denies medication side effects.  She recently went to her OB/GYN office and her systolic was in the 190s. She had not taken her medication the days she was trying to fast. She was asymptomatic. But has noted that her blood pressure has been more elevated recently. Though sometimes she says it's in the 120s when she checks it.  Oral herpes - She would like a refill on her valacyclovir.  She would also like to have her thyroid checked. She mentioned to her OB/GYN that she has a family history and felt like her gland might be a little bit larger than it used to be and would like to have it checked.  She also wanted me to check her right ear. She says she feels like it gets blocked up and then will sometimes open her release and she realizes that she is actually hearing better. She denies any significant pain or drainage from the ear. No fevers chills or sweats and no recent cold symptoms. She thinks it could be related to allergies but wasn't sure what to take over-the-counter that would be safe with her blood pressure.  Past medical history, Surgical history, Family history not pertinant except as noted below, Social history, Allergies, and medications have been entered into the medical record, reviewed, and no changes needed.   Review of Systems: No fevers, chills, night sweats, weight loss, chest pain, or shortness of breath.   Objective:    General: Well Developed, well nourished, and in no acute distress.  Neuro: Alert and oriented x3, extra-ocular muscles intact, sensation grossly intact.  HEENT: Normocephalic, atraumatic, pupils equal round reactive to light, neck supple, no masses, no lymphadenopathy, thyroid symmetric with not lesions or enlargement.  TMs and canals are clear bilaterally though she does have distortion of the light reflex in the  right ear. Skin: Warm and dry, no rashes. Cardiac: Regular rate and rhythm, no murmurs rubs or gallops, no lower extremity edema.  Respiratory: Clear to auscultation bilaterally. Not using accessory muscles, speaking in full sentences.   Impression and Recommendations:    HTN - uncontrolled today. We discussed several options. She is Artie on 100 mg metoprolol, 10 mg of amlodipine and 20 mg of enalapril. We discussed a couple different options including switching her benazepril to [place]. We also discussed maybe trying a different diuretic. She had taken HCTZ in the past and had side effects. She opted to try the chlorthalidone for a month. Also her back at that time. If she's not feeling well she can always call me in the interim. Due for fasting lab work today.  Family history of thyroid disease-we'll check TSH. Her thyroid gland feels symmetric and does not feel enlarged on exam.  Oral herpes - refilled her valacyclovir today.  Eustachian tube dysfunction-recommend a trial of over-the-counter fluticasone. She wanted me to print a prescription to see if it might be cheaper if she runs it through her prescription drug coverage. Also can take an over-the-counter antihistamine as long as it does not have a decongestant.

## 2016-04-17 ENCOUNTER — Ambulatory Visit: Payer: 59 | Admitting: Family Medicine

## 2016-06-18 ENCOUNTER — Other Ambulatory Visit: Payer: Self-pay | Admitting: Family Medicine

## 2016-06-27 ENCOUNTER — Other Ambulatory Visit: Payer: Self-pay | Admitting: Family Medicine

## 2016-08-09 ENCOUNTER — Other Ambulatory Visit: Payer: Self-pay | Admitting: Family Medicine

## 2016-08-25 ENCOUNTER — Telehealth: Payer: Self-pay | Admitting: Family Medicine

## 2016-08-25 NOTE — Telephone Encounter (Signed)
I called patient to schedule a f/u on BP with Dr. Linford ArnoldMetheney but pt is unable to schedule at this time because she is working on Advertising account plannergetting insurance coverage. Thanks

## 2016-10-08 ENCOUNTER — Other Ambulatory Visit: Payer: Self-pay | Admitting: Family Medicine

## 2016-10-29 ENCOUNTER — Other Ambulatory Visit: Payer: Self-pay | Admitting: Family Medicine

## 2016-12-21 ENCOUNTER — Ambulatory Visit: Payer: 59 | Admitting: Family Medicine

## 2016-12-24 ENCOUNTER — Ambulatory Visit (INDEPENDENT_AMBULATORY_CARE_PROVIDER_SITE_OTHER): Payer: BLUE CROSS/BLUE SHIELD | Admitting: Family Medicine

## 2016-12-24 ENCOUNTER — Encounter: Payer: Self-pay | Admitting: Family Medicine

## 2016-12-24 VITALS — BP 148/79 | HR 120 | Ht 67.0 in | Wt 184.0 lb

## 2016-12-24 DIAGNOSIS — R9431 Abnormal electrocardiogram [ECG] [EKG]: Secondary | ICD-10-CM | POA: Diagnosis not present

## 2016-12-24 DIAGNOSIS — I10 Essential (primary) hypertension: Secondary | ICD-10-CM

## 2016-12-24 DIAGNOSIS — R000 Tachycardia, unspecified: Secondary | ICD-10-CM

## 2016-12-24 DIAGNOSIS — I517 Cardiomegaly: Secondary | ICD-10-CM | POA: Diagnosis not present

## 2016-12-24 DIAGNOSIS — H539 Unspecified visual disturbance: Secondary | ICD-10-CM

## 2016-12-24 MED ORDER — METOPROLOL SUCCINATE ER 100 MG PO TB24: ORAL_TABLET | ORAL | 1 refills | Status: DC

## 2016-12-24 MED ORDER — AMLODIPINE BESY-BENAZEPRIL HCL 10-20 MG PO CAPS: 1.0000 | ORAL_CAPSULE | Freq: Every day | ORAL | 1 refills | Status: DC

## 2016-12-24 MED ORDER — CHLORTHALIDONE 25 MG PO TABS: 25.0000 mg | ORAL_TABLET | ORAL | 1 refills | Status: DC

## 2016-12-24 NOTE — Progress Notes (Signed)
Subjective:    CC: HTN  HPI:  Hypertension- Pt denies chest pain, SOB, dizziness, or heart palpitations.  Taking meds as directed w/o problems.  Denies medication side effects.  She hasn't been able to come in because her husband lost his job and she was without health insurance for a period of time that she has been taking her medication regularly.   She does note that she's had some vision changes more so over the last couple months. That she is due for her yearly eye exam and last year she was told that she will probably need bifocals in her lenses.  He came into the office her pulse was 120s and we went ahead and did an EKG today to make sure that she wasn't in some type of tachyarrhythmia.  Past medical history, Surgical history, Family history not pertinant except as noted below, Social history, Allergies, and medications have been entered into the medical record, reviewed, and corrections made.   Review of Systems: No fevers, chills, night sweats, weight loss, chest pain, or shortness of breath.   Objective:    General: Well Developed, well nourished, and in no acute distress.  Neuro: Alert and oriented x3, extra-ocular muscles intact, sensation grossly intact.  HEENT: Normocephalic, atraumatic  Skin: Warm and dry, no rashes. Cardiac: Regular rate and rhythm, 2/6 SEM murmurs, no  rubs or gallops, no lower extremity edema.  Respiratory: Clear to auscultation bilaterally. Not using accessory muscles, speaking in full sentences.   Impression and Recommendations:    HTN - Blood pressure was elevated on exam today.  Tachycardia-pulse decreased into the 70s on exam. EKG showed rate of 71 bpm, normal sinus rhythm with left ventricular hypertrophy. She did have an echocardiogram done about 2 and half years ago that did not indicate any type of LVH.  Abnormal EKG-we'll get echocardiogram to evaluate for LVH.  Vision change-encourage her to schedule her eye exam.

## 2016-12-29 NOTE — Addendum Note (Signed)
Addended by: Collie SiadICHARDSON, Sayre Mazor M on: 12/29/2016 10:12 AM   Modules accepted: Orders

## 2017-01-13 ENCOUNTER — Ambulatory Visit (HOSPITAL_BASED_OUTPATIENT_CLINIC_OR_DEPARTMENT_OTHER)
Admission: RE | Admit: 2017-01-13 | Discharge: 2017-01-13 | Disposition: A | Discharge status: Home / Self Care | Payer: BLUE CROSS/BLUE SHIELD | Source: Ambulatory Visit | Attending: Family Medicine | Admitting: Family Medicine

## 2017-01-13 DIAGNOSIS — I340 Nonrheumatic mitral (valve) insufficiency: Secondary | ICD-10-CM | POA: Diagnosis not present

## 2017-01-13 DIAGNOSIS — R011 Cardiac murmur, unspecified: Secondary | ICD-10-CM | POA: Diagnosis not present

## 2017-01-13 DIAGNOSIS — R9431 Abnormal electrocardiogram [ECG] [EKG]: Secondary | ICD-10-CM | POA: Diagnosis present

## 2017-01-13 DIAGNOSIS — I10 Essential (primary) hypertension: Secondary | ICD-10-CM | POA: Diagnosis not present

## 2017-01-13 DIAGNOSIS — I071 Rheumatic tricuspid insufficiency: Secondary | ICD-10-CM | POA: Diagnosis not present

## 2017-01-13 DIAGNOSIS — I517 Cardiomegaly: Secondary | ICD-10-CM | POA: Diagnosis not present

## 2017-01-13 NOTE — Progress Notes (Signed)
Echocardiogram 2D Echocardiogram has been performed.  Connie Mills 01/13/2017, 10:44 AM

## 2017-01-15 ENCOUNTER — Ambulatory Visit (INDEPENDENT_AMBULATORY_CARE_PROVIDER_SITE_OTHER): Payer: BLUE CROSS/BLUE SHIELD | Admitting: Family Medicine

## 2017-01-15 VITALS — BP 136/79 | HR 72

## 2017-01-15 DIAGNOSIS — I10 Essential (primary) hypertension: Secondary | ICD-10-CM | POA: Diagnosis not present

## 2017-01-15 LAB — BASIC METABOLIC PANEL WITH GFR
BUN: 14 mg/dL (ref 7–25)
CO2: 27 mmol/L (ref 20–31)
Calcium: 9.6 mg/dL (ref 8.6–10.2)
Chloride: 103 mmol/L (ref 98–110)
Creat: 0.94 mg/dL (ref 0.50–1.10)
GFR, Est African American: 87 mL/min (ref >=60)
GFR, Est Non African American: 75 mL/min (ref >=60)
Glucose, Bld: 74 mg/dL (ref 65–99)
Potassium: 3.9 mmol/L (ref 3.5–5.3)
Sodium: 139 mmol/L (ref 135–146)

## 2017-01-15 LAB — TSH: TSH: 1.33 mIU/L

## 2017-01-15 NOTE — Progress Notes (Signed)
Agree with above.  Georgia Baria, MD  

## 2017-01-15 NOTE — Progress Notes (Signed)
Subjective:    Patient ID: Connie Mills, female    DOB: January 27, 1976, 41 y.o.   MRN: [id number]  Connie Mills is here for blood pressure check. Last visit her blood pressure was elevated 148/79. She is taking the chlorthalidone as she feels she needs it. She reports feeling dry on the days she takes it.       Review of Systems  Constitutional: Negative.   HENT: Negative.   Eyes: Negative.   Respiratory: Negative.   Cardiovascular: Negative for chest pain, palpitations and leg swelling.  Gastrointestinal: Negative.   Endocrine: Negative.   Genitourinary: Negative.   Neurological: Negative for dizziness and numbness.       Objective:   Physical Exam        Assessment & Plan:  Hypertension - Second check of blood pressure within normal limits. Continue current medications. Patient advised to take half a tablet of the chlorthalidone daily. Call if any problems.

## 2017-01-17 NOTE — Progress Notes (Signed)
All labs are normal. 

## 2017-03-26 ENCOUNTER — Encounter: Payer: Self-pay | Admitting: Family Medicine

## 2017-03-26 ENCOUNTER — Ambulatory Visit (INDEPENDENT_AMBULATORY_CARE_PROVIDER_SITE_OTHER): Payer: BLUE CROSS/BLUE SHIELD | Admitting: Family Medicine

## 2017-03-26 VITALS — BP 123/67 | HR 68 | Ht 67.0 in | Wt 185.0 lb

## 2017-03-26 DIAGNOSIS — I10 Essential (primary) hypertension: Secondary | ICD-10-CM

## 2017-03-26 DIAGNOSIS — Z020 Encounter for examination for admission to educational institution: Secondary | ICD-10-CM | POA: Diagnosis not present

## 2017-03-26 DIAGNOSIS — J301 Allergic rhinitis due to pollen: Secondary | ICD-10-CM | POA: Diagnosis not present

## 2017-03-26 DIAGNOSIS — Z23 Encounter for immunization: Secondary | ICD-10-CM

## 2017-03-26 NOTE — Progress Notes (Signed)
Subjective:    CC:   HPI: Hypertension- Pt denies chest pain, SOB, dizziness, or heart palpitations.  Taking meds as directed w/o problems.  Denies medication side effects.    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 She needs up-to-date vaccine record.  She also reports that last week she had some fever or chills and sweats last Friday. She not better but then today she has a sore throat and bilateral ear pain. The right ear is more painful than the left. She has not had return of the fever or chills. But she was also outside all day today for her child's field trip and thinks some of this could be the pollen exposure.  Past medical history, Surgical history, Family history not pertinant except as noted below, Social history, Allergies, and medications have been entered into the medical record, reviewed, and corrections made.   Review of Systems: No fevers, chills, night sweats, weight loss, chest pain, or shortness of breath.   Objective:    General: Well Developed, well nourished, and in no acute distress.  Neuro: Alert and oriented x3, extra-ocular muscles intact, sensation grossly intact.  HEENT: Normocephalic, atraumatic, Oropharynx is clear, TMs and canals are clear bilaterally. No cervical lymphadenopathy on exam.  Skin: Warm and dry, no rashes. Cardiac: Regular rate and rhythm, no murmurs rubs or gallops, no lower extremity edema.  Respiratory: Clear to auscultation bilaterally. Not using accessory muscles, speaking in full sentences.   Impression and Recommendations:   HTN - Well controlled. Continue current regimen. Follow up in  6 months.   Immunizations-we compiled all of her records today into our chart. We will also get that entered into the [employer] immunization [place]. She is due for a T down so we will get that updated today. At this point her school does not require a TB skin  test but she Concerta follow-up if she finds that she needs one done.  Allergic rhinitis-I suspect her symptoms are related to her allergies. Recommend a trial of an over-the-counter antihistamine or a nasal steroid spray. If she feels like she is getting worse or not progressing it may also be an upper respiratory illness. She can give us a call next week if not improving.

## 2017-04-29 ENCOUNTER — Other Ambulatory Visit: Payer: Self-pay | Admitting: Family Medicine

## 2017-05-05 ENCOUNTER — Other Ambulatory Visit: Payer: Self-pay | Admitting: Family Medicine

## 2017-05-05 ENCOUNTER — Telehealth: Payer: Self-pay | Admitting: Sports Medicine

## 2017-05-05 DIAGNOSIS — B002 Herpesviral gingivostomatitis and pharyngotonsillitis: Secondary | ICD-10-CM

## 2017-05-05 MED ORDER — VALACYCLOVIR HCL 1 G PO TABS: 1000.0000 mg | ORAL_TABLET | Freq: Every day | ORAL | 1 refills | Status: DC

## 2017-05-05 NOTE — Telephone Encounter (Signed)
Pt requested refill on valtrex. She has a fever blister currently. PCP has filled in the past. Refill sent.

## 2017-06-11 ENCOUNTER — Other Ambulatory Visit: Payer: Self-pay | Admitting: Family Medicine

## 2017-06-21 ENCOUNTER — Ambulatory Visit (INDEPENDENT_AMBULATORY_CARE_PROVIDER_SITE_OTHER): Payer: BLUE CROSS/BLUE SHIELD | Admitting: Obstetrics & Gynecology

## 2017-06-21 ENCOUNTER — Encounter: Payer: Self-pay | Admitting: Obstetrics & Gynecology

## 2017-06-21 ENCOUNTER — Encounter: Payer: Self-pay | Admitting: *Deleted

## 2017-06-21 VITALS — BP 143/81 | HR 53 | Resp 16 | Ht 67.0 in | Wt 191.0 lb

## 2017-06-21 DIAGNOSIS — Z30433 Encounter for removal and reinsertion of intrauterine contraceptive device: Secondary | ICD-10-CM | POA: Diagnosis not present

## 2017-06-21 DIAGNOSIS — Z01419 Encounter for gynecological examination (general) (routine) without abnormal findings: Secondary | ICD-10-CM

## 2017-06-21 DIAGNOSIS — Z3202 Encounter for pregnancy test, result negative: Secondary | ICD-10-CM

## 2017-06-21 DIAGNOSIS — Z30430 Encounter for insertion of intrauterine contraceptive device: Secondary | ICD-10-CM

## 2017-06-21 LAB — POCT URINE PREGNANCY: Preg Test, Ur: NEGATIVE

## 2017-06-21 MED ORDER — LEVONORGESTREL 20 MCG/24HR IU IUD
INTRAUTERINE_SYSTEM | Freq: Once | INTRAUTERINE | Status: AC
  Administered 2017-06-21: 16:00:00 via INTRAUTERINE

## 2017-06-21 NOTE — Progress Notes (Signed)
Subjective:     Connie Mills is a 41 y.o. female here for a routine exam.  Current complaints: none.  Wants IUD removed and new one inserted.    Gynecologic History Patient's last menstrual period was 06/17/2017. Contraception: IUD Last Pap: 2017. Results were: normal Last mammogram: none  Wants to start at 43.  Discussed national recommendations..  Obstetric History OB History  Gravida Para Term Preterm AB Living  3 2 2   1 2   SAB TAB Ectopic Multiple Live Births  1 0 0 0 2    # Outcome Date GA Lbr Len/2nd Weight Sex Delivery Anes PTL Lv  3 Term 04/29/12 1954w1d 05:06 / 02:25 5 lb 10 oz (2.551 kg) F VBAC, Vacuum EPI  LIV     Birth Comments: caput  2 SAB 07/28/11          1 Term 05/16/92 [id number]  7 lb (3.175 kg) M CS-Unspec EPI N LIV     Birth Comments: breech       The following portions of the patient's history were reviewed and updated as appropriate: allergies, current medications, past family history, past medical history, past social history, past surgical history and problem list.  Review of Systems Pertinent items noted in HPI and remainder of comprehensive ROS otherwise negative.    Objective:      Vitals:   06/21/17 1527  BP: (!) 143/81  Pulse: (!) 53  Resp: 16  Weight: 191 lb (86.6 kg)  Height: 5\' 7"  (1.702 m)   Vitals:  WNL General appearance: alert, cooperative and no distress  HEENT: Normocephalic, without obvious abnormality, atraumatic Eyes: negative Throat: lips, mucosa, and tongue normal; teeth and gums normal  Respiratory: Clear to auscultation bilaterally  CV: Regular rate and rhythm  Breasts:  Normal appearance, no masses or tenderness, no nipple retraction or dimpling  GI: Soft, non-tender; bowel sounds normal; no masses,  no organomegaly  GU: External Genitalia:  Tanner V, no lesion Urethra:  No prolapse   Vagina: Pink, normal rugae, no blood or discharge  Cervix: No CMT, no lesion  Uterus:  Normal size and contour, non tender  Adnexa:  Normal, no masses, non tender  Musculoskeletal: No edema, redness or tenderness in the calves or thighs  Skin: No lesions or rash  Lymphatic: Axillary adenopathy: none     Psychiatric: Normal mood and behavior        Assessment:    Healthy female exam.   HTN    Plan:    Pap up to date. Mammogram age 41 IUD removal and insertion   GYNECOLOGY CLINIC PROCEDURE NOTE  Connie Mills is a 41 y.o. [id number] here for Mirena IUD removal and reinsertion. No GYN concerns.  Last pap smear was on 2017 and was normal.  IUD Removal and Reinsertion  Patient identified, informed consent performed. Discussed risks of irregular bleeding, cramping, infection, malpositioning or misplacement of the IUD outside the uterus which may require further procedures. Time out was performed. Speculum placed in the vagina. Cervix visualized. Cleaned with Betadine x 2. Grasped anteriorly with a single tooth tenaculum. The strings of the IUD were grasped and pulled using ring forceps. The IUD was successfully removed in its entirety. Uterus sounded to 10 cm. Mirena IUD placed per manufacturer's recommendations. Strings trimmed to 3 cm. Tenaculum was removed, good hemostasis noted. Patient tolerated procedure well. Patient was given post-procedure instructions.  Patient was also asked to check IUD strings periodically and follow up in 4 weeks for IUD  check.

## 2017-06-28 ENCOUNTER — Other Ambulatory Visit: Payer: Self-pay | Admitting: Family Medicine

## 2017-07-10 ENCOUNTER — Other Ambulatory Visit: Payer: Self-pay | Admitting: Family Medicine

## 2017-07-27 ENCOUNTER — Encounter: Payer: Self-pay | Admitting: Family Medicine

## 2017-07-27 ENCOUNTER — Ambulatory Visit (INDEPENDENT_AMBULATORY_CARE_PROVIDER_SITE_OTHER): Payer: BLUE CROSS/BLUE SHIELD | Admitting: Family Medicine

## 2017-07-27 VITALS — BP 184/76 | HR 61 | Ht 67.0 in | Wt 189.0 lb

## 2017-07-27 DIAGNOSIS — N920 Excessive and frequent menstruation with regular cycle: Secondary | ICD-10-CM

## 2017-07-27 DIAGNOSIS — Z1389 Encounter for screening for other disorder: Secondary | ICD-10-CM

## 2017-07-27 DIAGNOSIS — Z1331 Encounter for screening for depression: Secondary | ICD-10-CM

## 2017-07-27 DIAGNOSIS — R102 Pelvic and perineal pain: Secondary | ICD-10-CM

## 2017-07-27 DIAGNOSIS — I10 Essential (primary) hypertension: Secondary | ICD-10-CM

## 2017-07-27 MED ORDER — CELECOXIB 200 MG PO CAPS: ORAL_CAPSULE | ORAL | 5 refills | Status: DC

## 2017-07-27 NOTE — Progress Notes (Signed)
Subjective:    Patient ID: Connie Mills, female    DOB: 16-Jul-1976, 41 y.o.   MRN: [id number]  HPI 41 yo female comes in today bc she is not feeling well. She says in early August she had her old Mirena removed and a new one put in. Ever since then she has had pelvic pain, pain during intercourse, and moody and depressed.  She is cramping all the time.   She has an appt with [facility] in about 2 weeks.  She has constant vaginal pressure and even has pain walking up and down the stair. No fever, chills o9r sweats.  She just feels like this is really wearing on her emotionally. She said been having pain with intercourse.she is at the point where she would like to consider having the IUD removed in going on birth control.  Hypertension- Pt denies chest pain, SOB, dizziness, or heart palpitations.  Taking meds as directed w/o problems.  Denies medication side effects.     Review of Systems  BP (!) 184/76   Pulse 61   Ht 5\' 7"  (1.702 m)   Wt 189 lb (85.7 kg)   SpO2 100%   BMI 29.60 kg/m     Allergies  Allergen Reactions  . Dilaudid [Hydromorphone Hcl] Itching  . Hctz [Hydrochlorothiazide] Other (See Comments)    Doesn't feel well on it    Past Medical History:  Diagnosis Date  . Abnormal Pap smear of cervix 2005   Leep; normal pap after  . AMA (advanced maternal age) multigravida 35+   . Anemia   . Blood transfusion May 16, 1992   "busted blood vessel behind incision", led to bleeding.  . Blood transfusion complicating pregnancy 1993  . Hypertension    labetolol 200 bid    Past Surgical History:  Procedure Laterality Date  . CERVICAL BIOPSY  W/ LOOP ELECTRODE EXCISION    . CESAREAN SECTION  1993    Breech, blood loss transfusion with "busted blood vessels behind incision"  . WISDOM TOOTH EXTRACTION  2003    Social History   Social History  . Marital status: Married    Spouse name: N/A  . Number of children: N/A  . Years of education: N/A   Occupational  History  . Nurse tech [employer]   Social History Main Topics  . Smoking status: Never Smoker  . Smokeless tobacco: Never Used  . Alcohol use No  . Drug use: No  . Sexual activity: Yes    Partners: Male    Birth control/ protection: IUD     Comment: for tubal   Other Topics Concern  . Not on file   Social History Narrative  . No narrative on file    Family History  Problem Relation Age of Onset  . Hypertension Mother   . Hypertension Brother   . Diabetes Maternal Grandmother   . Cancer Paternal Grandfather   . Hypertension Sister   . Thyroid disease Sister   . Thyroid disease Maternal Aunt   . Other Neg Hx     Outpatient Encounter Prescriptions as of 07/27/2017  Medication Sig  . amLODipine-benazepril (LOTREL) 10-20 MG capsule TAKE 1 CAPSULE BY MOUTH DAILY.  . celecoxib (CELEBREX) 200 MG capsule TAKE 1 CAPSULE (200 MG TOTAL) BY MOUTH 2 (TWO) TIMES DAILY.  . chlorthalidone (HYGROTON) 25 MG tablet TAKE 1 TABLET (25 MG TOTAL) BY MOUTH EVERY MORNING.  Marland Kitchen ibuprofen (ADVIL,MOTRIN) 600 MG tablet Take 600 mg by mouth every  6 (six) hours as needed.  . metoprolol succinate (TOPROL-XL) 100 MG 24 hr tablet TAKE ONE TABLET BY MOUTH DAILY.  . valACYclovir (VALTREX) 1000 MG tablet TAKE 1 TABLET (1,000 MG TOTAL) BY MOUTH DAILY.  . [DISCONTINUED] celecoxib (CELEBREX) 200 MG capsule TAKE 1 CAPSULE (200 MG TOTAL) BY MOUTH 2 (TWO) TIMES DAILY.   No facility-administered encounter medications on file as of 07/27/2017.          Objective:   Physical Exam  Constitutional: She is oriented to person, place, and time. She appears well-developed and well-nourished.  HENT:  Head: Normocephalic and atraumatic.  Eyes: Conjunctivae and EOM are normal.  Cardiovascular: Normal rate.   Pulmonary/Chest: Effort normal.  Neurological: She is alert and oriented to person, place, and time.  Skin: Skin is dry. No pallor.  Psychiatric: She has a normal mood and affect. Her behavior is normal.  Vitals  reviewed.         Assessment & Plan:  Pelvic pain-I agree that it's unusual to have pain greater than 4 weeks out from IUD insertion. Discussed that the recommendation would be to do an ultrasound to check placement of the IUD and also check for infection. She could certainly have some cervicitis endometrium that is that she's been afebrile. Once that's been ruled out and certainly it would be reasonable to remove the IUD if she still having persistent pain. She will try to call and get in sooner with her OB/GYN. I did offer to do the workup here today but she declined and says she will follow up with them. I do think she'll be a good candidate to go on a birth control pill. She had taken them previously for several years and did well. She has never been a smoker.  Hypertension- due for CMP and lipids panel.   Menorrhagia-we'll refill her Celebrex. . She uses this for when she gets heavy cramping around her period.  Positive depression screen-she did have a positive depression screen but I do think this is related to the daily pain that she's been expressing the last month. I want to work on getting this better before we decide to treat her for depression. I think this is more situational.

## 2017-08-10 ENCOUNTER — Ambulatory Visit (INDEPENDENT_AMBULATORY_CARE_PROVIDER_SITE_OTHER): Payer: BLUE CROSS/BLUE SHIELD | Admitting: Obstetrics & Gynecology

## 2017-08-10 ENCOUNTER — Encounter: Payer: Self-pay | Admitting: Obstetrics & Gynecology

## 2017-08-10 VITALS — BP 168/90 | HR 65 | Ht 66.0 in | Wt 191.0 lb

## 2017-08-10 DIAGNOSIS — Z3009 Encounter for other general counseling and advice on contraception: Secondary | ICD-10-CM

## 2017-08-10 DIAGNOSIS — Z30432 Encounter for removal of intrauterine contraceptive device: Secondary | ICD-10-CM | POA: Diagnosis not present

## 2017-08-10 DIAGNOSIS — T839XXS Unspecified complication of genitourinary prosthetic device, implant and graft, sequela: Secondary | ICD-10-CM

## 2017-08-10 MED ORDER — MEDROXYPROGESTERONE ACETATE 150 MG/ML IM SUSP: 150.0000 mg | INTRAMUSCULAR | 3 refills | Status: DC

## 2017-08-10 NOTE — Progress Notes (Signed)
Subjective:    Patient ID: Connie Mills, female    DOB: [birth year], 41 y.o.   MRN: [id number]  HPI  Pt c/o pelvic pain with IUD>  She had an IUD previously and did well.  This one has been very different and wants it removed.  She wants OCPs but has very high BP.  Pt states that she does not have elevated BP normally but is anxious about getting IUD removed.  Review of Systems  Constitutional: Negative.   Cardiovascular: Negative.   Gastrointestinal: Negative.   Genitourinary: Positive for pelvic pain.  Psychiatric/Behavioral: Negative.        Objective:   Physical Exam  Constitutional: She is oriented to person, place, and time. She appears well-developed and well-nourished. No distress.  HENT:  Head: Normocephalic and atraumatic.  Eyes: Conjunctivae are normal.  Pulmonary/Chest: Effort normal.  Abdominal: Soft. She exhibits no distension. There is no tenderness.  Genitourinary: Vagina normal.  Musculoskeletal: She exhibits no edema.  Neurological: She is alert and oriented to person, place, and time.  Skin: Skin is warm and dry.  Psychiatric: She has a normal mood and affect.  Vitals reviewed.  Vitals:   08/10/17 1552  BP: (!) 168/90  Pulse: 65  Weight: 191 lb (86.6 kg)  Height: 5\' 6"  (1.676 m)    Assessment & Plan:  41 yo female with severe pelvic pain after IUD insertion and hyperension  1.  IUD removed.  Pt not candidate for OCPs.  Pt opts for depo.  Good calcium intake and Vit D reviewed.  Condoms until first 2 week after depo.  Pt understands she is fertile.   2.  Pt monitors BP at home.  Will report high values to her PCP.  Pt states it is not high at home.   IUD Removal Patient identified, informed consent performed. Discussed risks of irregular bleeding, cramping, infection, malpositioning or misplacement of the IUD outside the uterus which may require further procedures. Time out was performed. Speculum placed in the vagina. Cervix visualized. The strings  of the IUD were grasped and pulled using ring forceps. The IUD was successfully removed in its entirety.

## 2017-08-10 NOTE — Progress Notes (Signed)
Pt states that she would like the IUD taken out. It causes her pain and pressure everyday and pain with intercourse. Would like to begin [facility] Pills.

## 2017-08-17 ENCOUNTER — Ambulatory Visit (INDEPENDENT_AMBULATORY_CARE_PROVIDER_SITE_OTHER): Payer: BLUE CROSS/BLUE SHIELD

## 2017-08-17 DIAGNOSIS — Z30013 Encounter for initial prescription of injectable contraceptive: Secondary | ICD-10-CM

## 2017-08-17 DIAGNOSIS — Z3042 Encounter for surveillance of injectable contraceptive: Secondary | ICD-10-CM

## 2017-08-17 MED ORDER — MEDROXYPROGESTERONE ACETATE 150 MG/ML IM SUSP
150.0000 mg | Freq: Once | INTRAMUSCULAR | Status: AC
  Administered 2017-08-17: 150 mg via INTRAMUSCULAR

## 2017-08-17 MED ORDER — MEDROXYPROGESTERONE ACETATE 150 MG/ML IM SUSP: 150.0000 mg | INTRAMUSCULAR | 0 refills | Status: DC

## 2017-09-10 ENCOUNTER — Encounter: Payer: Self-pay | Admitting: Physician Assistant

## 2017-09-10 ENCOUNTER — Ambulatory Visit (INDEPENDENT_AMBULATORY_CARE_PROVIDER_SITE_OTHER): Payer: BLUE CROSS/BLUE SHIELD | Admitting: Physician Assistant

## 2017-09-10 VITALS — BP 167/95 | HR 66 | Temp 98.7°F | Wt 193.0 lb

## 2017-09-10 DIAGNOSIS — I10 Essential (primary) hypertension: Secondary | ICD-10-CM | POA: Diagnosis not present

## 2017-09-10 DIAGNOSIS — J0100 Acute maxillary sinusitis, unspecified: Secondary | ICD-10-CM | POA: Diagnosis not present

## 2017-09-10 DIAGNOSIS — R05 Cough: Secondary | ICD-10-CM

## 2017-09-10 DIAGNOSIS — R058 Other specified cough: Secondary | ICD-10-CM

## 2017-09-10 MED ORDER — AMOXICILLIN-POT CLAVULANATE 875-125 MG PO TABS: 1.0000 | ORAL_TABLET | Freq: Two times a day (BID) | ORAL | 0 refills | Status: AC

## 2017-09-10 NOTE — Patient Instructions (Addendum)
- Delsym for cough - Check your Theraflu package to see if it contains an expectorant/ chest decongestant such as Guaifenasin - Drink a lot of fluids   Sinusitis, Adult Sinusitis is soreness and inflammation of your sinuses. Sinuses are hollow spaces in the bones around your face. Your sinuses are located:  Around your eyes.  In the middle of your forehead.  Behind your nose.  In your cheekbones.  Your sinuses and nasal passages are lined with a stringy fluid (mucus). Mucus normally drains out of your sinuses. When your nasal tissues become inflamed or swollen, the mucus can become trapped or blocked so air cannot flow through your sinuses. This allows bacteria, viruses, and funguses to grow, which leads to infection. Sinusitis can develop quickly and last for 7?10 days (acute) or for more than 12 weeks (chronic). Sinusitis often develops after a cold. What are the causes? This condition is caused by anything that creates swelling in the sinuses or stops mucus from draining, including:  Allergies.  Asthma.  Bacterial or viral infection.  Abnormally shaped bones between the nasal passages.  Nasal growths that contain mucus (nasal polyps).  Narrow sinus openings.  Pollutants, such as chemicals or irritants in the air.  A foreign object stuck in the nose.  A fungal infection. This is rare.  What increases the risk? The following factors may make you more likely to develop this condition:  Having allergies or asthma.  Having had a recent cold or respiratory tract infection.  Having structural deformities or blockages in your nose or sinuses.  Having a weak immune system.  Doing a lot of swimming or diving.  Overusing nasal sprays.  Smoking.  What are the signs or symptoms? The main symptoms of this condition are pain and a feeling of pressure around the affected sinuses. Other symptoms include:  Upper toothache.  Earache.  Headache.  Bad  breath.  Decreased sense of smell and taste.  A cough that may get worse at night.  Fatigue.  Fever.  Thick drainage from your nose. The drainage is often green and it may contain pus (purulent).  Stuffy nose or congestion.  Postnasal drip. This is when extra mucus collects in the throat or back of the nose.  Swelling and warmth over the affected sinuses.  Sore throat.  Sensitivity to light.  How is this diagnosed? This condition is diagnosed based on symptoms, a medical history, and a physical exam. To find out if your condition is acute or chronic, your health care provider may:  Look in your nose for signs of nasal polyps.  Tap over the affected sinus to check for signs of infection.  View the inside of your sinuses using an imaging device that has a light attached (endoscope).  If your health care provider suspects that you have chronic sinusitis, you may also:  Be tested for allergies.  Have a sample of mucus taken from your nose (nasal culture) and checked for bacteria.  Have a mucus sample examined to see if your sinusitis is related to an allergy.  If your sinusitis does not respond to treatment and it lasts longer than 8 weeks, you may have an MRI or CT scan to check your sinuses. These scans also help to determine how severe your infection is. In rare cases, a bone biopsy may be done to rule out more serious types of fungal sinus disease. How is this treated? Treatment for sinusitis depends on the cause and whether your condition is chronic  or acute. If a virus is causing your sinusitis, your symptoms will go away on their own within 10 days. You may be given medicines to relieve your symptoms, including:  Topical nasal decongestants. They shrink swollen nasal passages and let mucus drain from your sinuses.  Antihistamines. These drugs block inflammation that is triggered by allergies. This can help to ease swelling in your nose and sinuses.  Topical nasal  corticosteroids. These are nasal sprays that ease inflammation and swelling in your nose and sinuses.  Nasal saline washes. These rinses can help to get rid of thick mucus in your nose.  If your condition is caused by bacteria, you will be given an antibiotic medicine. If your condition is caused by a fungus, you will be given an antifungal medicine. Surgery may be needed to correct underlying conditions, such as narrow nasal passages. Surgery may also be needed to remove polyps. Follow these instructions at home: Medicines  Take, use, or apply over-the-counter and prescription medicines only as told by your health care provider. These may include nasal sprays.  If you were prescribed an antibiotic medicine, take it as told by your health care provider. Do not stop taking the antibiotic even if you start to feel better. Hydrate and Humidify  Drink enough water to keep your urine clear or pale yellow. Staying hydrated will help to thin your mucus.  Use a cool mist humidifier to keep the humidity level in your home above 50%.  Inhale steam for 10-15 minutes, 3-4 times a day or as told by your health care provider. You can do this in the bathroom while a hot shower is running.  Limit your exposure to cool or dry air. Rest  Rest as much as possible.  Sleep with your head raised (elevated).  Make sure to get enough sleep each night. General instructions  Apply a warm, moist washcloth to your face 3-4 times a day or as told by your health care provider. This will help with discomfort.  Wash your hands often with soap and water to reduce your exposure to viruses and other germs. If soap and water are not available, use hand sanitizer.  Do not smoke. Avoid being around people who are smoking (secondhand smoke).  Keep all follow-up visits as told by your health care provider. This is important. Contact a health care provider if:  You have a fever.  Your symptoms get worse.  Your  symptoms do not improve within 10 days. Get help right away if:  You have a severe headache.  You have persistent vomiting.  You have pain or swelling around your face or eyes.  You have vision problems.  You develop confusion.  Your neck is stiff.  You have trouble breathing. This information is not intended to replace advice given to you by your health care provider. Make sure you discuss any questions you have with your health care provider. Document Released: 11/02/2005 Document Revised: 06/28/2016 Document Reviewed: 08/28/2015 Elsevier Interactive Patient Education  2017 [employer]

## 2017-09-10 NOTE — Progress Notes (Signed)
HPI:                                                                Connie Mills is a 41 y.o. female who presents to [facility] today for URI symptoms  URI   This is a new problem. The current episode started in the past 7 days. The problem has been gradually worsening. There has been no fever. Associated symptoms include congestion (productive of purulent mucus), coughing, a plugged ear sensation and a sore throat. Pertinent negatives include no vomiting. Associated symptoms comments: + hoarseness. She has tried steam (Theraflu, Nyquil) for the symptoms. The treatment provided mild relief.     Past Medical History:  Diagnosis Date  . Abnormal Pap smear of cervix 2005   Leep; normal pap after  . AMA (advanced maternal age) multigravida 35+   . Anemia   . Blood transfusion May 16, 1992   "busted blood vessel behind incision", led to bleeding.  . Blood transfusion complicating pregnancy 1993  . Hypertension    labetolol 200 bid   Past Surgical History:  Procedure Laterality Date  . CERVICAL BIOPSY  W/ LOOP ELECTRODE EXCISION    . CESAREAN SECTION  1993    Breech, blood loss transfusion with "busted blood vessels behind incision"  . WISDOM TOOTH EXTRACTION  2003   Social History  Substance Use Topics  . Smoking status: Never Smoker  . Smokeless tobacco: Never Used  . Alcohol use No   family history includes Cancer in her paternal grandfather; Diabetes in her maternal grandmother; Hypertension in her brother, mother, and sister; Thyroid disease in her maternal aunt and sister.  ROS: negative except as noted in the HPI  Medications: Current Outpatient Prescriptions  Medication Sig Dispense Refill  . amLODipine-benazepril (LOTREL) 10-20 MG capsule TAKE 1 CAPSULE BY MOUTH DAILY. 90 capsule 1  . celecoxib (CELEBREX) 200 MG capsule TAKE 1 CAPSULE (200 MG TOTAL) BY MOUTH 2 (TWO) TIMES DAILY. 60 capsule 5  . chlorthalidone  (HYGROTON) 25 MG tablet TAKE 1 TABLET (25 MG TOTAL) BY MOUTH EVERY MORNING. 90 tablet 1  . ibuprofen (ADVIL,MOTRIN) 600 MG tablet Take 600 mg by mouth every 6 (six) hours as needed.    . medroxyPROGESTERone (DEPO-PROVERA) 150 MG/ML injection Inject 1 mL (150 mg total) into the muscle every 3 (three) months. 1 mL 3  . metoprolol succinate (TOPROL-XL) 100 MG 24 hr tablet TAKE ONE TABLET BY MOUTH DAILY. 90 tablet 1  . valACYclovir (VALTREX) 1000 MG tablet TAKE 1 TABLET (1,000 MG TOTAL) BY MOUTH DAILY. 20 tablet 1   No current facility-administered medications for this visit.    Allergies  Allergen Reactions  . Dilaudid [Hydromorphone Hcl] Itching  . Hctz [Hydrochlorothiazide] Other (See Comments)    Doesn't feel well on it       Objective:  BP (!) 167/95   Pulse 66   Temp 98.7 F (37.1 C) (Oral)   Wt 193 lb (87.5 kg)   SpO2 100%   BMI 31.15 kg/m  Gen:  alert, not ill-appearing, no distress, appropriate for age, obese female HEENT: head normocephalic without obvious abnormality, conjunctiva and cornea clear, oropharynx clear, uvula midline, no exudates, TM's clear bilaterally, there is left maxillary sinus tenderness,  neck supple, no adenopathy, trachea midline Pulm: Normal work of breathing, normal phonation, clear to auscultation bilaterally, no wheezes, rales or rhonchi CV: Normal rate, regular rhythm, s1 and s2 distinct, no murmurs, clicks or rubs  Neuro: alert and oriented x 3, no tremor MSK: extremities atraumatic, normal gait and station Skin: intact, no rashes on exposed skin, no jaundice, no cyanosis   No results found for this or any previous visit (from the past 72 hour(s)). No results found.    Assessment and Plan: 41 y.o. female with   1. Acute non-recurrent maxillary sinusitis - amoxicillin-clavulanate (AUGMENTIN) 875-125 MG tablet; Take 1 tablet by mouth 2 (two) times daily.  Dispense: 14 tablet; Refill: 0  2. Productive cough - Mucinex and Delsym - patient  declines Tessalon and cannot tolerate opioid cough medicines  3. Uncontrolled hypertension BP Readings from Last 3 Encounters:  09/10/17 (!) 167/95  08/10/17 (!) 168/90  07/27/17 (!) 184/76  - reports she does not take her Chlorthalidone daily and did not take it today - she is compliant with her Metoprolol - explained risks of uncontrolled hypertension, to include stroke. Encouraged to take Chlorthalidone QD   Patient education and anticipatory guidance given Patient agrees with treatment plan Follow-up as needed if symptoms worsen or fail to improve  Levonne Hubertharley E. Cummings PA-C

## 2017-11-02 ENCOUNTER — Ambulatory Visit: Payer: BLUE CROSS/BLUE SHIELD | Admitting: Obstetrics and Gynecology

## 2017-11-02 ENCOUNTER — Ambulatory Visit (INDEPENDENT_AMBULATORY_CARE_PROVIDER_SITE_OTHER): Payer: BLUE CROSS/BLUE SHIELD

## 2017-11-02 DIAGNOSIS — Z3042 Encounter for surveillance of injectable contraceptive: Secondary | ICD-10-CM

## 2017-11-02 MED ORDER — MEDROXYPROGESTERONE ACETATE 150 MG/ML IM SUSP
150.0000 mg | Freq: Once | INTRAMUSCULAR | Status: AC
  Administered 2017-11-02: 150 mg via INTRAMUSCULAR

## 2017-11-10 ENCOUNTER — Ambulatory Visit: Payer: BLUE CROSS/BLUE SHIELD | Admitting: Obstetrics & Gynecology

## 2017-12-03 ENCOUNTER — Other Ambulatory Visit: Payer: Self-pay | Admitting: Family Medicine

## 2017-12-06 ENCOUNTER — Ambulatory Visit (INDEPENDENT_AMBULATORY_CARE_PROVIDER_SITE_OTHER): Payer: BLUE CROSS/BLUE SHIELD | Admitting: Obstetrics & Gynecology

## 2017-12-06 ENCOUNTER — Encounter: Payer: Self-pay | Admitting: Obstetrics & Gynecology

## 2017-12-06 VITALS — BP 145/80 | HR 68 | Resp 16 | Ht 67.0 in | Wt 198.0 lb

## 2017-12-06 DIAGNOSIS — Z124 Encounter for screening for malignant neoplasm of cervix: Secondary | ICD-10-CM

## 2017-12-06 DIAGNOSIS — Z01419 Encounter for gynecological examination (general) (routine) without abnormal findings: Secondary | ICD-10-CM

## 2017-12-06 DIAGNOSIS — Z1151 Encounter for screening for human papillomavirus (HPV): Secondary | ICD-10-CM | POA: Diagnosis not present

## 2017-12-06 NOTE — Progress Notes (Signed)
Subjective:    Connie Mills is a 42 y.o. married P2 (25 and 505 yo kids)female who presents for an annual exam.   She has been on depo provera for 6 months and doesn't periods.female who presents for an annual exam. The patient has no complaints today. The patient is sexually active. GYN screening history: last pap: was normal. The patient wears seatbelts: yes. The patient participates in regular exercise: no. Has the patient ever been transfused or tattooed?: yes.( transfused with her first delivery, c/s) The patient reports that there is not domestic violence in her life.   Menstrual History: OB History    Gravida Para Term Preterm AB Living   3 2 2   1 2    SAB TAB Ectopic Multiple Live Births   1 0 0 0 2      Menarche age: [age] No LMP recorded (lmp unknown). Patient has had an injection.    The following portions of the patient's history were reviewed and updated as appropriate: allergies, current medications, past family history, past medical history, past social history, past surgical history and problem list.  Review of Systems Pertinent items are noted in HPI.   + dementia in mother and mat GM, no gyn or breast, ?colon in her maternal GF Married for 7 years Had a bad experience with an IUD in the past. Works for [employer] Had flu vaccine through work    Objective:    BP (!) 145/80   Pulse 68   Resp 16   Ht 5\' 7"  (1.702 m)   Wt 198 lb (89.8 kg)   LMP  (LMP Unknown)   BMI 31.01 kg/m   General Appearance:    Alert, cooperative, no distress, appears stated age  Head:    Normocephalic, without obvious abnormality, atraumatic  Eyes:    PERRL, conjunctiva/corneas clear, EOM's intact, fundi    benign, both eyes  Ears:    Normal TM's and external ear canals, both ears  Nose:   Nares normal, septum midline, mucosa normal, no drainage    or sinus tenderness  Throat:   Lips, mucosa, and tongue normal; teeth and gums normal  Neck:   Supple, symmetrical, trachea midline, no adenopathy;     thyroid:  no enlargement/tenderness/nodules; no carotid   bruit or JVD  Back:     Symmetric, no curvature, ROM normal, no CVA tenderness  Lungs:     Clear to auscultation bilaterally, respirations unlabored  Chest Wall:    No tenderness or deformity   Heart:    Regular rate and rhythm, S1 and S2 normal, no murmur, rub   or gallop  Breast Exam:    No tenderness, masses, or nipple abnormality  Abdomen:     Soft, non-tender, bowel sounds active all four quadrants,    no masses, no organomegaly  Genitalia:    Normal female without lesion, discharge or tenderness, normal size and shape, anteverted, mobile, non-tender, normal adnexal exam      Extremities:   Extremities normal, atraumatic, no cyanosis or edema  Pulses:   2+ and symmetric all extremities  Skin:   Skin color, texture, turgor normal, no rashes or lesions  Lymph nodes:   Cervical, supraclavicular, and axillary nodes normal  Neurologic:   CNII-XII intact, normal strength, sensation and reflexes    throughout  .    Assessment:    Healthy female exam.    Plan:     Mammogram. Thin prep Pap smear. with cotesting Discussed risks of dementia,  suggested no more depo and instead consider vas or bilateral salpingectomy.

## 2017-12-08 LAB — CYTOLOGY - PAP
Adequacy: ABSENT
Diagnosis: NEGATIVE
HPV: NOT DETECTED

## 2018-01-21 ENCOUNTER — Other Ambulatory Visit: Payer: Self-pay | Admitting: Family Medicine

## 2018-01-25 ENCOUNTER — Encounter: Payer: Self-pay | Admitting: Family Medicine

## 2018-01-25 ENCOUNTER — Ambulatory Visit: Payer: BLUE CROSS/BLUE SHIELD

## 2018-01-25 ENCOUNTER — Ambulatory Visit (INDEPENDENT_AMBULATORY_CARE_PROVIDER_SITE_OTHER): Payer: BLUE CROSS/BLUE SHIELD | Admitting: Family Medicine

## 2018-01-25 ENCOUNTER — Ambulatory Visit (INDEPENDENT_AMBULATORY_CARE_PROVIDER_SITE_OTHER): Payer: BLUE CROSS/BLUE SHIELD | Admitting: *Deleted

## 2018-01-25 VITALS — BP 133/70 | HR 84 | Ht 67.0 in | Wt 196.0 lb

## 2018-01-25 DIAGNOSIS — Z3042 Encounter for surveillance of injectable contraceptive: Secondary | ICD-10-CM

## 2018-01-25 DIAGNOSIS — I10 Essential (primary) hypertension: Secondary | ICD-10-CM

## 2018-01-25 MED ORDER — MEDROXYPROGESTERONE ACETATE 150 MG/ML IM SUSP
150.0000 mg | INTRAMUSCULAR | Status: AC
  Administered 2018-01-25 – 2018-08-04 (×2): 150 mg via INTRAMUSCULAR

## 2018-01-25 NOTE — Progress Notes (Signed)
Subjective:    CC: BP  HPI:  Hypertension- Pt denies chest pain, SOB, dizziness, or heart palpitations.  Taking meds as directed w/o problems.  Denies medication side effects.    She has been stressed as she has been working on her masters. She should finish up this summer.    She is on the Depo shot for birth control and has been getting some weight on it but wants to stay on it for a little while longer.    Past medical history, Surgical history, Family history not pertinant except as noted below, Social history, Allergies, and medications have been entered into the medical record, reviewed, and corrections made.   Review of Systems: No fevers, chills, night sweats, weight loss, chest pain, or shortness of breath.   Objective:    General: Well Developed, well nourished, and in no acute distress.  Neuro: Alert and oriented x3, extra-ocular muscles intact, sensation grossly intact.  HEENT: Normocephalic, atraumatic  Skin: Warm and dry, no rashes. Cardiac: Regular rate and rhythm, no murmurs rubs or gallops, no lower extremity edema.  Respiratory: Clear to auscultation bilaterally. Not using accessory muscles, speaking in full sentences.   Impression and Recommendations:    HTN -controlled.  Continue current regimen.  Follow-up in 6 months.  Due for CMP and lipid panel.  She will try to go to the lab next week when she has a couple of days off of work.

## 2018-02-04 LAB — BASIC METABOLIC PANEL WITH GFR
BUN: 10 mg/dL (ref 7–25)
CO2: 27 mmol/L (ref 20–32)
Calcium: 9.8 mg/dL (ref 8.6–10.2)
Chloride: 106 mmol/L (ref 98–110)
Creat: 0.76 mg/dL (ref 0.50–1.10)
GFR, Est African American: 111 mL/min/{1.73_m2} (ref >=60)
GFR, Est Non African American: 96 mL/min/{1.73_m2} (ref >=60)
Glucose, Bld: 75 mg/dL (ref 65–99)
Potassium: 4 mmol/L (ref 3.5–5.3)
Sodium: 140 mmol/L (ref 135–146)

## 2018-02-04 LAB — LIPID PANEL
Cholesterol: 191 mg/dL (ref <200)
HDL: 45 mg/dL — ABNORMAL LOW (ref >50)
LDL Cholesterol (Calc): 128 mg/dL (calc) — ABNORMAL HIGH
Non-HDL Cholesterol (Calc): 146 mg/dL (calc) — ABNORMAL HIGH (ref <130)
Total CHOL/HDL Ratio: 4.2 (calc) (ref <5.0)
Triglycerides: 84 mg/dL (ref <150)

## 2018-04-04 ENCOUNTER — Other Ambulatory Visit: Payer: Self-pay | Admitting: Family Medicine

## 2018-04-29 ENCOUNTER — Ambulatory Visit (INDEPENDENT_AMBULATORY_CARE_PROVIDER_SITE_OTHER): Payer: BLUE CROSS/BLUE SHIELD

## 2018-04-29 DIAGNOSIS — Z3042 Encounter for surveillance of injectable contraceptive: Secondary | ICD-10-CM | POA: Diagnosis not present

## 2018-04-29 MED ORDER — MEDROXYPROGESTERONE ACETATE 150 MG/ML IM SUSP
150.0000 mg | Freq: Once | INTRAMUSCULAR | Status: AC
  Administered 2018-04-29: 150 mg via INTRAMUSCULAR

## 2018-06-06 ENCOUNTER — Other Ambulatory Visit: Payer: Self-pay | Admitting: *Deleted

## 2018-06-06 MED ORDER — MEDROXYPROGESTERONE ACETATE 150 MG/ML IM SUSP: 150.0000 mg | INTRAMUSCULAR | 2 refills | Status: DC

## 2018-06-08 ENCOUNTER — Other Ambulatory Visit: Payer: Self-pay

## 2018-06-08 MED ORDER — VALACYCLOVIR HCL 1 G PO TABS: 1000.0000 mg | ORAL_TABLET | Freq: Every day | ORAL | 6 refills | Status: DC

## 2018-07-22 ENCOUNTER — Other Ambulatory Visit: Payer: Self-pay | Admitting: Obstetrics & Gynecology

## 2018-07-28 ENCOUNTER — Other Ambulatory Visit: Payer: Self-pay | Admitting: Family Medicine

## 2018-07-29 ENCOUNTER — Ambulatory Visit: Payer: BLUE CROSS/BLUE SHIELD | Admitting: Family Medicine

## 2018-08-03 ENCOUNTER — Other Ambulatory Visit: Payer: Self-pay | Admitting: *Deleted

## 2018-08-03 MED ORDER — CELECOXIB 200 MG PO CAPS: ORAL_CAPSULE | ORAL | 5 refills | Status: DC

## 2018-08-04 ENCOUNTER — Ambulatory Visit (INDEPENDENT_AMBULATORY_CARE_PROVIDER_SITE_OTHER): Payer: BLUE CROSS/BLUE SHIELD | Admitting: Family Medicine

## 2018-08-04 ENCOUNTER — Encounter: Payer: Self-pay | Admitting: *Deleted

## 2018-08-04 ENCOUNTER — Ambulatory Visit (INDEPENDENT_AMBULATORY_CARE_PROVIDER_SITE_OTHER): Payer: BLUE CROSS/BLUE SHIELD | Admitting: *Deleted

## 2018-08-04 ENCOUNTER — Encounter: Payer: Self-pay | Admitting: Family Medicine

## 2018-08-04 VITALS — BP 132/84 | HR 58 | Ht 67.0 in | Wt 210.0 lb

## 2018-08-04 DIAGNOSIS — F439 Reaction to severe stress, unspecified: Secondary | ICD-10-CM

## 2018-08-04 DIAGNOSIS — Z3042 Encounter for surveillance of injectable contraceptive: Secondary | ICD-10-CM | POA: Diagnosis not present

## 2018-08-04 DIAGNOSIS — I10 Essential (primary) hypertension: Secondary | ICD-10-CM

## 2018-08-04 MED ORDER — METOPROLOL SUCCINATE ER 100 MG PO TB24: 100.0000 mg | ORAL_TABLET | Freq: Every day | ORAL | 1 refills | Status: DC

## 2018-08-04 NOTE — Progress Notes (Signed)
Pt here for Depo Provera IM given Lt deltoid.  RTC in 12 weeks

## 2018-08-04 NOTE — Progress Notes (Signed)
Subjective:    CC: BP check   HPI:  Hypertension- Pt denies chest pain, SOB, dizziness, or heart palpitations.  Taking meds as directed w/o problems.  Denies medication side effects.  He has had some occasional mild headaches.  Swelling.  Has been tracking her blood pressures at home and says most of the time she is been getting less than 120 on the top and in the 60s on the bottom number.  Been under a lot of stress recently with working full-time and going to school and try to take care of her children.  She says that she is been waking up in the middle the night just feeling stressed and anxious and with her mind racing about the next day.  Past medical history, Surgical history, Family history not pertinant except as noted below, Social history, Allergies, and medications have been entered into the medical record, reviewed, and corrections made.   Review of Systems: No fevers, chills, night sweats, weight loss, chest pain, or shortness of breath.   Objective:    General: Well Developed, well nourished, and in no acute distress.  Neuro: Alert and oriented x3, extra-ocular muscles intact, sensation grossly intact.  HEENT: Normocephalic, atraumatic  Skin: Warm and dry, no rashes. Cardiac: Regular rate and rhythm, no murmurs rubs or gallops, no lower extremity edema.  Respiratory: Clear to auscultation bilaterally. Not using accessory muscles, speaking in full sentences.   Impression and Recommendations:    HTN - Well controlled. Continue current regimen. Follow up in  6 months.  Due BMP,   Acute stress - WE discussed strategies around stress redcution and getting adequate rest.  I am happy to follow this up if more conservative strategies ar not helping.

## 2018-08-05 LAB — BASIC METABOLIC PANEL WITH GFR
BUN: 8 mg/dL (ref 7–25)
CO2: 27 mmol/L (ref 20–32)
Calcium: 9.6 mg/dL (ref 8.6–10.2)
Chloride: 104 mmol/L (ref 98–110)
Creat: 0.96 mg/dL (ref 0.50–1.10)
GFR, Est African American: 83 mL/min/{1.73_m2} (ref >=60)
GFR, Est Non African American: 72 mL/min/{1.73_m2} (ref >=60)
Glucose, Bld: 78 mg/dL (ref 65–99)
Potassium: 3.8 mmol/L (ref 3.5–5.3)
Sodium: 140 mmol/L (ref 135–146)

## 2018-08-05 NOTE — Progress Notes (Signed)
All labs are normal. 

## 2018-08-09 ENCOUNTER — Encounter: Payer: Self-pay | Admitting: Family Medicine

## 2018-10-06 ENCOUNTER — Encounter: Payer: Self-pay | Admitting: Family Medicine

## 2018-10-06 ENCOUNTER — Ambulatory Visit (INDEPENDENT_AMBULATORY_CARE_PROVIDER_SITE_OTHER): Payer: BLUE CROSS/BLUE SHIELD | Admitting: Family Medicine

## 2018-10-06 VITALS — BP 135/85 | HR 85 | Temp 98.4°F | Wt 207.7 lb

## 2018-10-06 DIAGNOSIS — J0111 Acute recurrent frontal sinusitis: Secondary | ICD-10-CM

## 2018-10-06 MED ORDER — CEFDINIR 300 MG PO CAPS: 300.0000 mg | ORAL_CAPSULE | Freq: Two times a day (BID) | ORAL | 0 refills | Status: DC

## 2018-10-06 MED ORDER — PREDNISONE 10 MG PO TABS: 30.0000 mg | ORAL_TABLET | Freq: Every day | ORAL | 0 refills | Status: DC

## 2018-10-06 MED ORDER — GUAIFENESIN-CODEINE 100-10 MG/5ML PO SOLN: 5.0000 mL | Freq: Four times a day (QID) | ORAL | 0 refills | Status: DC | PRN

## 2018-10-06 NOTE — Progress Notes (Signed)
Connie Mills is a 42 y.o. female who [facility] today for sinus pain and pressure fevers chills body aches neck stiffness congestion and facial pain and pressure.  Symptoms present now for about 4 days.  She was improving then abruptly worsened this morning.  Her symptoms are consistent with previous episodes of bacterial sinus infections.  She is tried multiple over-the-counter medications which have only helped a little.  She has a nonproductive cough that does interfere with sleep as well.  She denies severe shortness of breath.  She denies any weakness or numbness or loss of function.  She notes neck soreness in the anterior to lateral neck.  She denies any rash.  She received a flu vaccine 2 weeks ago.   ROS as above:  Exam:  BP 135/85   Pulse 85   Temp 98.4 F (36.9 C) (Oral)   Wt 207 lb 11.2 oz (94.2 kg)   BMI 32.53 kg/m  Wt Readings from Last 5 Encounters:  10/06/18 207 lb 11.2 oz (94.2 kg)  08/04/18 210 lb (95.3 kg)  01/25/18 196 lb (88.9 kg)  12/06/17 198 lb (89.8 kg)  09/10/17 193 lb (87.5 kg)    Gen: Well NAD HEENT: EOMI,  MMM posterior pharynx mildly erythematous with mild cobblestoning no tonsillar hypertrophy.  Nasal turbinates inflamed bilaterally with clear nasal discharge.  No cervical lymphadenopathy.  Normal cervical spinal motion no meningismus.  Tender palpation frontal sinuses bilaterally.  Nontender maxillary sinuses.  Right frontal sinus more tender than left. Lungs: Normal work of breathing. CTABL Heart: RRR no MRG Abd: NABS, Soft. Nondistended, Nontender Exts: Brisk capillary refill, warm and well perfused.  Skin: No petechiae present  Lab and Radiology Results No results found for this or any previous visit (from the past 72 hour(s)). No results found.    Assessment and Plan: 42 y.o. female with sinusitis likely  initially viral not bacterial.  Patient experienced a second sickening.  She does have systemic symptoms suggestive of virus however myalgias.  No meningeal signs today.  Plan for treatment with continued over-the-counter medications.  We will add prednisone and Omnicef antibiotics.  Use codeine cough syrup as patient is quite symptomatic with cough at bedtime.  Recheck if not improving.  School note provided.  Patient [place] [facility] [place] [place].   No orders of the defined types were placed in this encounter.  Meds ordered this encounter  Medications  . predniSONE (DELTASONE) 10 MG tablet    Sig: Take 3 tablets (30 mg total) by mouth daily with breakfast.    Dispense:  15 tablet    Refill:  0  . cefdinir (OMNICEF) 300 MG capsule    Sig: Take 1 capsule (300 mg total) by mouth 2 (two) times daily.    Dispense:  14 capsule    Refill:  0  . guaiFENesin-codeine 100-10 MG/5ML syrup    Sig: Take 5 mLs by mouth every 6 (six) hours as needed.    Dispense:  120 mL    Refill:  0     Historical information moved to improve visibility of documentation.  Past Medical History:  Diagnosis Date  . Abnormal Pap smear of cervix 2005   Leep; normal pap after  . AMA (advanced maternal age) multigravida 35+   . Anemia   . Blood transfusion May 16, 1992   "busted blood vessel behind incision", led to bleeding.  . Blood  transfusion complicating pregnancy 1993  . Hypertension    labetolol 200 bid   Past Surgical History:  Procedure Laterality Date  . CERVICAL BIOPSY  W/ LOOP ELECTRODE EXCISION    . CESAREAN SECTION  1993    Breech, blood loss transfusion with "busted blood vessels behind incision"  . WISDOM TOOTH EXTRACTION  2003   Social History   Tobacco Use  . Smoking status: Never Smoker  . Smokeless tobacco: Never Used  Substance Use Topics  . Alcohol use: No   family history includes Cancer in her paternal grandfather; Diabetes in her maternal  grandmother; Hypertension in her brother, mother, and sister; Thyroid disease in her maternal aunt and sister.  Medications: Current Outpatient Medications  Medication Sig Dispense Refill  . amLODipine-benazepril (LOTREL) 10-20 MG capsule TAKE 1 CAPSULE BY MOUTH EVERY DAY 90 capsule 1  . celecoxib (CELEBREX) 200 MG capsule TAKE 1 CAPSULE (200 MG TOTAL) BY MOUTH 2 (TWO) TIMES DAILY. 60 capsule 5  . chlorthalidone (HYGROTON) 25 MG tablet TAKE 1 TABLET (25 MG TOTAL) BY MOUTH EVERY MORNING. 90 tablet 1  . ibuprofen (ADVIL,MOTRIN) 600 MG tablet Take 600 mg by mouth every 6 (six) hours as needed.    . metoprolol succinate (TOPROL-XL) 100 MG 24 hr tablet Take 1 tablet (100 mg total) by mouth daily. Take with or immediately following a meal. 90 tablet 1  . valACYclovir (VALTREX) 1000 MG tablet Take 1 tablet (1,000 mg total) by mouth daily. 20 tablet 6  . cefdinir (OMNICEF) 300 MG capsule Take 1 capsule (300 mg total) by mouth 2 (two) times daily. 14 capsule 0  . guaiFENesin-codeine 100-10 MG/5ML syrup Take 5 mLs by mouth every 6 (six) hours as needed. 120 mL 0  . predniSONE (DELTASONE) 10 MG tablet Take 3 tablets (30 mg total) by mouth daily with breakfast. 15 tablet 0   Current Facility-Administered Medications  Medication Dose Route Frequency Provider Last Rate Last Dose  . medroxyPROGESTERone (DEPO-PROVERA) injection 150 mg  150 mg Intramuscular Q90 days Marice Potter, Myra C, MD   150 mg at 08/04/18 1337   Allergies  Allergen Reactions  . Dilaudid [Hydromorphone Hcl] Itching  . Hctz [Hydrochlorothiazide] Other (See Comments)    Doesn't feel well on it     Discussed warning signs or symptoms. Please see discharge instructions. Patient expresses understanding.

## 2018-10-06 NOTE — Patient Instructions (Addendum)
Thank you for coming in today.  Continue over the counter medicine.   Take prednisone for 5 days Take omnicef antibiotic twice daly for 1 week.   Recheck if not better.   Call or go to the emergency room if you get worse, have trouble breathing, have chest pains, or palpitations.    Sinusitis, Adult Sinusitis is soreness and inflammation of your sinuses. Sinuses are hollow spaces in the bones around your face. Your sinuses are located:  Around your eyes.  In the middle of your forehead.  Behind your nose.  In your cheekbones.  Your sinuses and nasal passages are lined with a stringy fluid (mucus). Mucus normally drains out of your sinuses. When your nasal tissues become inflamed or swollen, the mucus can become trapped or blocked so air cannot flow through your sinuses. This allows bacteria, viruses, and funguses to grow, which leads to infection. Sinusitis can develop quickly and last for 7?10 days (acute) or for more than 12 weeks (chronic). Sinusitis often develops after a cold. What are the causes? This condition is caused by anything that creates swelling in the sinuses or stops mucus from draining, including:  Allergies.  Asthma.  Bacterial or viral infection.  Abnormally shaped bones between the nasal passages.  Nasal growths that contain mucus (nasal polyps).  Narrow sinus openings.  Pollutants, such as chemicals or irritants in the air.  A foreign object stuck in the nose.  A fungal infection. This is rare.  What increases the risk? The following factors may make you more likely to develop this condition:  Having allergies or asthma.  Having had a recent cold or respiratory tract infection.  Having structural deformities or blockages in your nose or sinuses.  Having a weak immune system.  Doing a lot of swimming or diving.  Overusing nasal sprays.  Smoking.  What are the signs or symptoms? The main symptoms of this condition are pain and a  feeling of pressure around the affected sinuses. Other symptoms include:  Upper toothache.  Earache.  Headache.  Bad breath.  Decreased sense of smell and taste.  A cough that may get worse at night.  Fatigue.  Fever.  Thick drainage from your nose. The drainage is often green and it may contain pus (purulent).  Stuffy nose or congestion.  Postnasal drip. This is when extra mucus collects in the throat or back of the nose.  Swelling and warmth over the affected sinuses.  Sore throat.  Sensitivity to light.  How is this diagnosed? This condition is diagnosed based on symptoms, a medical history, and a physical exam. To find out if your condition is acute or chronic, your health care provider may:  Look in your nose for signs of nasal polyps.  Tap over the affected sinus to check for signs of infection.  View the inside of your sinuses using an imaging device that has a light attached (endoscope).  If your health care provider suspects that you have chronic sinusitis, you may also:  Be tested for allergies.  Have a sample of mucus taken from your nose (nasal culture) and checked for bacteria.  Have a mucus sample examined to see if your sinusitis is related to an allergy.  If your sinusitis does not respond to treatment and it lasts longer than 8 weeks, you may have an MRI or CT scan to check your sinuses. These scans also help to determine how severe your infection is. In rare cases, a bone biopsy may be  done to rule out more serious types of fungal sinus disease. How is this treated? Treatment for sinusitis depends on the cause and whether your condition is chronic or acute. If a virus is causing your sinusitis, your symptoms will go away on their own within 10 days. You may be given medicines to relieve your symptoms, including:  Topical nasal decongestants. They shrink swollen nasal passages and let mucus drain from your sinuses.  Antihistamines. These drugs  block inflammation that is triggered by allergies. This can help to ease swelling in your nose and sinuses.  Topical nasal corticosteroids. These are nasal sprays that ease inflammation and swelling in your nose and sinuses.  Nasal saline washes. These rinses can help to get rid of thick mucus in your nose.  If your condition is caused by bacteria, you will be given an antibiotic medicine. If your condition is caused by a fungus, you will be given an antifungal medicine. Surgery may be needed to correct underlying conditions, such as narrow nasal passages. Surgery may also be needed to remove polyps. Follow these instructions at home: Medicines  Take, use, or apply over-the-counter and prescription medicines only as told by your health care provider. These may include nasal sprays.  If you were prescribed an antibiotic medicine, take it as told by your health care provider. Do not stop taking the antibiotic even if you start to feel better. Hydrate and Humidify  Drink enough water to keep your urine clear or pale yellow. Staying hydrated will help to thin your mucus.  Use a cool mist humidifier to keep the humidity level in your home above 50%.  Inhale steam for 10-15 minutes, 3-4 times a day or as told by your health care provider. You can do this in the bathroom while a hot shower is running.  Limit your exposure to cool or dry air. Rest  Rest as much as possible.  Sleep with your head raised (elevated).  Make sure to get enough sleep each night. General instructions  Apply a warm, moist washcloth to your face 3-4 times a day or as told by your health care provider. This will help with discomfort.  Wash your hands often with soap and water to reduce your exposure to viruses and other germs. If soap and water are not available, use hand sanitizer.  Do not smoke. Avoid being around people who are smoking (secondhand smoke).  Keep all follow-up visits as told by your health care  provider. This is important. Contact a health care provider if:  You have a fever.  Your symptoms get worse.  Your symptoms do not improve within 10 days. Get help right away if:  You have a severe headache.  You have persistent vomiting.  You have pain or swelling around your face or eyes.  You have vision problems.  You develop confusion.  Your neck is stiff.  You have trouble breathing. This information is not intended to replace advice given to you by your health care provider. Make sure you discuss any questions you have with your health care provider. Document Released: 11/02/2005 Document Revised: 06/28/2016 Document Reviewed: 08/28/2015 Elsevier Interactive Patient Education  [employer].

## 2018-10-24 ENCOUNTER — Other Ambulatory Visit: Payer: Self-pay | Admitting: *Deleted

## 2018-10-24 ENCOUNTER — Other Ambulatory Visit: Payer: Self-pay | Admitting: Family Medicine

## 2018-11-03 ENCOUNTER — Ambulatory Visit: Payer: BLUE CROSS/BLUE SHIELD

## 2018-11-10 ENCOUNTER — Ambulatory Visit (INDEPENDENT_AMBULATORY_CARE_PROVIDER_SITE_OTHER): Payer: BLUE CROSS/BLUE SHIELD | Admitting: *Deleted

## 2018-11-10 DIAGNOSIS — Z3042 Encounter for surveillance of injectable contraceptive: Secondary | ICD-10-CM | POA: Diagnosis not present

## 2018-11-10 MED ORDER — MEDROXYPROGESTERONE ACETATE 150 MG/ML IM SUSP
150.0000 mg | INTRAMUSCULAR | Status: AC
  Administered 2018-11-10: 150 mg via INTRAMUSCULAR

## 2018-11-10 NOTE — Progress Notes (Signed)
Pt here for Depo Provera 150 mg IM injection.  Given Lt deltoid.

## 2019-01-21 ENCOUNTER — Other Ambulatory Visit: Payer: Self-pay | Admitting: Family Medicine

## 2019-01-27 ENCOUNTER — Encounter: Payer: Self-pay | Admitting: Family Medicine

## 2019-01-27 ENCOUNTER — Ambulatory Visit: Payer: BLUE CROSS/BLUE SHIELD

## 2019-01-27 ENCOUNTER — Other Ambulatory Visit: Payer: Self-pay

## 2019-01-27 ENCOUNTER — Ambulatory Visit (INDEPENDENT_AMBULATORY_CARE_PROVIDER_SITE_OTHER): Payer: BLUE CROSS/BLUE SHIELD | Admitting: Family Medicine

## 2019-01-27 ENCOUNTER — Ambulatory Visit: Payer: BLUE CROSS/BLUE SHIELD | Admitting: Family Medicine

## 2019-01-27 VITALS — BP 134/72 | HR 78 | Ht 67.0 in | Wt 207.0 lb

## 2019-01-27 DIAGNOSIS — Z3042 Encounter for surveillance of injectable contraceptive: Secondary | ICD-10-CM | POA: Diagnosis not present

## 2019-01-27 DIAGNOSIS — E785 Hyperlipidemia, unspecified: Secondary | ICD-10-CM

## 2019-01-27 DIAGNOSIS — Z8349 Family history of other endocrine, nutritional and metabolic diseases: Secondary | ICD-10-CM | POA: Diagnosis not present

## 2019-01-27 DIAGNOSIS — I10 Essential (primary) hypertension: Secondary | ICD-10-CM | POA: Diagnosis not present

## 2019-01-27 MED ORDER — MEDROXYPROGESTERONE ACETATE 150 MG/ML IM SUSP
150.0000 mg | Freq: Once | INTRAMUSCULAR | Status: AC
  Administered 2019-01-27: 150 mg via INTRAMUSCULAR

## 2019-01-27 NOTE — Progress Notes (Signed)
Subjective:    CC: BP  HPI:  Hypertension- Pt denies chest pain, SOB, dizziness, or heart palpitations.  Taking meds as directed w/o problems.  Denies medication side effects.  He has not been exercising.  And she has gained some weight.  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  Contraception-she is requesting her Depo-Provera shot today she is due at [facility] and they had asked if we would be able to give her the injection today.  Hyperlipidemia- due to recheck lipids.  Not currently on medication.  Past medical history, Surgical history, Family history not pertinant except as noted below, Social history, Allergies, and medications have been entered into the medical record, reviewed, and corrections made.   Review of Systems: No fevers, chills, night sweats, weight loss, chest pain, or shortness of breath.   Objective:    General: Well Developed, well nourished, and in no acute distress.  Neuro: Alert and oriented x3, extra-ocular muscles intact, sensation grossly intact.  HEENT: Normocephalic, atraumatic  Skin: Warm and dry, no rashes. Cardiac: Regular rate and rhythm, no murmurs rubs or gallops, no lower extremity edema.  Respiratory: Clear to auscultation bilaterally. Not using accessory muscles, speaking in full sentences.   Impression and Recommendations:    HTN - BP borderline today. She will check it at home. Due for labs. F/U in 6 mo.   Hyperlipidemia-due to recheck.  We did have a discussion today about getting him back on track with exercise.  She has been very focused on work in school and so admits that she has not been as active but plans on getting out and starting to do some walking now that the weather is better.  She is also gained a fair amount of weight and says she is got a try to get that back under control as well.  BMI 32 - see note above.   Contraception -Depo-Provera injection  given today.

## 2019-01-27 NOTE — Progress Notes (Signed)
Depo given in RD. Pt tolerated well. Next injection due 5/29-6/10/2019.Laureen Ochs, Viann Shove, CMA

## 2019-03-22 ENCOUNTER — Other Ambulatory Visit: Payer: Self-pay | Admitting: Family Medicine

## 2019-03-22 DIAGNOSIS — I10 Essential (primary) hypertension: Secondary | ICD-10-CM

## 2019-04-06 ENCOUNTER — Encounter: Payer: BLUE CROSS/BLUE SHIELD | Admitting: Family Medicine

## 2019-04-12 ENCOUNTER — Encounter: Payer: Self-pay | Admitting: Family Medicine

## 2019-04-12 ENCOUNTER — Other Ambulatory Visit: Payer: Self-pay

## 2019-04-12 ENCOUNTER — Ambulatory Visit (INDEPENDENT_AMBULATORY_CARE_PROVIDER_SITE_OTHER): Payer: BLUE CROSS/BLUE SHIELD

## 2019-04-12 ENCOUNTER — Ambulatory Visit (INDEPENDENT_AMBULATORY_CARE_PROVIDER_SITE_OTHER): Payer: BLUE CROSS/BLUE SHIELD | Admitting: Family Medicine

## 2019-04-12 VITALS — BP 131/71 | HR 63 | Ht 67.0 in | Wt 209.0 lb

## 2019-04-12 DIAGNOSIS — Z1231 Encounter for screening mammogram for malignant neoplasm of breast: Secondary | ICD-10-CM

## 2019-04-12 DIAGNOSIS — Z111 Encounter for screening for respiratory tuberculosis: Secondary | ICD-10-CM | POA: Diagnosis not present

## 2019-04-12 DIAGNOSIS — Z0000 Encounter for general adult medical examination without abnormal findings: Secondary | ICD-10-CM | POA: Diagnosis not present

## 2019-04-12 NOTE — Progress Notes (Signed)
Subjective:     Connie Mills is a 43 y.o. female and is here for a comprehensive physical exam. The patient reports no problems. She does have a form for work that she needs to have completed. She will be working for the [employer]. Her vaccines are up to date. Needs a TB screening.  She is doing well overall.  She has gained some weight with being home with COVID.    Social History   Socioeconomic History  . Marital status: Married    Spouse name: Not on file  . Number of children: Not on file  . Years of education: Not on file  . Highest education level: Not on file  Occupational History  . Occupation: Curatorurse tech    Employer: [employer]  Social Needs  . Financial resource strain: Not on file  . Food insecurity:    Worry: Not on file    Inability: Not on file  . Transportation needs:    Medical: Not on file    Non-medical: Not on file  Tobacco Use  . Smoking status: Never Smoker  . Smokeless tobacco: Never Used  Substance and Sexual Activity  . Alcohol use: No  . Drug use: No  . Sexual activity: Yes    Partners: Male    Birth control/protection: Injection    Comment: Depo  Lifestyle  . Physical activity:    Days per week: Not on file    Minutes per session: Not on file  . Stress: Not on file  Relationships  . Social connections:    Talks on phone: Not on file    Gets together: Not on file    Attends religious service: Not on file    Active member of club or organization: Not on file    Attends meetings of clubs or organizations: Not on file    Relationship status: Not on file  . Intimate partner violence:    Fear of current or ex partner: Not on file    Emotionally abused: Not on file    Physically abused: Not on file    Forced sexual activity: Not on file  Other Topics Concern  . Not on file  Social History Narrative  . Not on file   Health Maintenance  Topic Date Due  . INFLUENZA VACCINE  06/17/2019  . PAP SMEAR-Modifier  12/06/2020  .  TETANUS/TDAP  03/27/2027  . HIV Screening  Completed    The following portions of the patient's history were reviewed and updated as appropriate: allergies, current medications, past family history, past medical history, past social history, past surgical history and problem list.  Review of Systems A comprehensive review of systems was negative.   Objective:    BP 131/71   Pulse 63   Ht 5\' 7"  (1.702 m)   Wt 209 lb (94.8 kg)   SpO2 100%   BMI 32.73 kg/m  General appearance: alert, cooperative and appears stated age Head: Normocephalic, without obvious abnormality, atraumatic Eyes: conj clear, EOMI, PEERLA Ears: normal TM's and external ear canals both ears Nose: Nares normal. Septum midline. Mucosa normal. No drainage or sinus tenderness. Throat: lips, mucosa, and tongue normal; teeth and gums normal Neck: no adenopathy, no carotid bruit, no JVD, supple, symmetrical, trachea midline and thyroid not enlarged, symmetric, no tenderness/mass/nodules Back: symmetric, no curvature. ROM normal. No CVA tenderness. Lungs: clear to auscultation bilaterally Heart: regular rate and rhythm, S1, S2 normal, no murmur, click, rub or gallop Abdomen: soft, non-tender; bowel  sounds normal; no masses,  no organomegaly Extremities: extremities normal, atraumatic, no cyanosis or edema Pulses: 2+ and symmetric Skin: Skin color, texture, turgor normal. No rashes or lesions Lymph nodes: Cervical, supraclavicular, and axillary nodes normal. Neurologic: Alert and oriented X 3, normal strength and tone. Normal symmetric reflexes. Normal coordination and gait    Assessment:    Healthy female exam.      Plan:     See After Visit Summary for Counseling Recommendations   Keep up a regular exercise program and make sure you are eating a healthy diet Try to eat 4 servings of dairy a day, or if you are lactose intolerant take a calcium with vitamin D daily.  Your vaccines are up to date.  Will complete  form for work. She would like it scanned into Mychart once done.   Will order Gold interferon for the TB screening.  She will get her mammogram done today.

## 2019-04-12 NOTE — Patient Instructions (Signed)
 Preventive Care 18-39 Years, Female Preventive care refers to lifestyle choices and visits with your health care provider that can promote health and wellness. What does preventive care include?   A yearly physical exam. This is also called an annual well check.  Dental exams once or twice a year.  Routine eye exams. Ask your health care provider how often you should have your eyes checked.  Personal lifestyle choices, including: ? Daily care of your teeth and gums. ? Regular physical activity. ? Eating a healthy diet. ? Avoiding tobacco and drug use. ? Limiting alcohol use. ? Practicing safe sex. ? Taking vitamin and mineral supplements as recommended by your health care provider. What happens during an annual well check? The services and screenings done by your health care provider during your annual well check will depend on your age, overall health, lifestyle risk factors, and family history of disease. Counseling Your health care provider may ask you questions about your:  Alcohol use.  Tobacco use.  Drug use.  Emotional well-being.  Home and relationship well-being.  Sexual activity.  Eating habits.  Work and work environment.  Method of birth control.  Menstrual cycle.  Pregnancy history. Screening You may have the following tests or measurements:  Height, weight, and BMI.  Diabetes screening. This is done by checking your blood sugar (glucose) after you have not eaten for a while (fasting).  Blood pressure.  Lipid and cholesterol levels. These may be checked every 5 years starting at age 20.  Skin check.  Hepatitis C blood test.  Hepatitis B blood test.  Sexually transmitted disease (STD) testing.  BRCA-related cancer screening. This may be done if you have a family history of breast, ovarian, tubal, or peritoneal cancers.  Pelvic exam and Pap test. This may be done every 3 years starting at age 21. Starting at age 30, this may be done  every 5 years if you have a Pap test in combination with an HPV test. Discuss your test results, treatment options, and if necessary, the need for more tests with your health care provider. Vaccines Your health care provider may recommend certain vaccines, such as:  Influenza vaccine. This is recommended every year.  Tetanus, diphtheria, and acellular pertussis (Tdap, Td) vaccine. You may need a Td booster every 10 years.  Varicella vaccine. You may need this if you have not been vaccinated.  HPV vaccine. If you are 26 or younger, you may need three doses over 6 months.  Measles, mumps, and rubella (MMR) vaccine. You may need at least one dose of MMR. You may also need a second dose.  Pneumococcal 13-valent conjugate (PCV13) vaccine. You may need this if you have certain conditions and were not previously vaccinated.  Pneumococcal polysaccharide (PPSV23) vaccine. You may need one or two doses if you smoke cigarettes or if you have certain conditions.  Meningococcal vaccine. One dose is recommended if you are age 19-21 years and a first-year college student living in a residence hall, or if you have one of several medical conditions. You may also need additional booster doses.  Hepatitis A vaccine. You may need this if you have certain conditions or if you travel or work in places where you may be exposed to hepatitis A.  Hepatitis B vaccine. You may need this if you have certain conditions or if you travel or work in places where you may be exposed to hepatitis B.  Haemophilus influenzae type b (Hib) vaccine. You may need this if   you have certain risk factors. Talk to your health care provider about which screenings and vaccines you need and how often you need them. This information is not intended to replace advice given to you by your health care provider. Make sure you discuss any questions you have with your health care provider. Document Released: 12/29/2001 Document Revised:  06/15/2017 Document Reviewed: 09/03/2015 Elsevier Interactive Patient Education  2019 Elsevier Inc.  

## 2019-04-13 LAB — COMPLETE METABOLIC PANEL WITH GFR
AG Ratio: 1.4 (calc) (ref 1.0–2.5)
ALT: 11 U/L (ref 6–29)
AST: 13 U/L (ref 10–30)
Albumin: 4.2 g/dL (ref 3.6–5.1)
Alkaline phosphatase (APISO): 60 U/L (ref 31–125)
BUN: 9 mg/dL (ref 7–25)
CO2: 24 mmol/L (ref 20–32)
Calcium: 9.7 mg/dL (ref 8.6–10.2)
Chloride: 107 mmol/L (ref 98–110)
Creat: 0.92 mg/dL (ref 0.50–1.10)
GFR, Est African American: 88 mL/min/{1.73_m2} (ref >=60)
GFR, Est Non African American: 76 mL/min/{1.73_m2} (ref >=60)
Globulin: 3.1 g/dL (calc) (ref 1.9–3.7)
Glucose, Bld: 79 mg/dL (ref 65–99)
Potassium: 4.1 mmol/L (ref 3.5–5.3)
Sodium: 139 mmol/L (ref 135–146)
Total Bilirubin: 0.3 mg/dL (ref 0.2–1.2)
Total Protein: 7.3 g/dL (ref 6.1–8.1)

## 2019-04-13 LAB — LIPID PANEL
Cholesterol: 196 mg/dL (ref <200)
HDL: 49 mg/dL — ABNORMAL LOW (ref >=50)
LDL Cholesterol (Calc): 129 mg/dL (calc) — ABNORMAL HIGH
Non-HDL Cholesterol (Calc): 147 mg/dL (calc) — ABNORMAL HIGH (ref <130)
Total CHOL/HDL Ratio: 4 (calc) (ref <5.0)
Triglycerides: 85 mg/dL (ref <150)

## 2019-04-13 LAB — TSH: TSH: 1.52 mIU/L

## 2019-04-14 ENCOUNTER — Telehealth: Payer: Self-pay | Admitting: *Deleted

## 2019-04-14 LAB — QUANTIFERON-TB GOLD PLUS
Mitogen-NIL: >10 IU/mL
NIL: 0.02 IU/mL
QuantiFERON-TB Gold Plus: NEGATIVE
TB1-NIL: 0 IU/mL
TB2-NIL: 0 IU/mL

## 2019-04-14 NOTE — Telephone Encounter (Signed)
Called [facility] to ask about the status of this test. Spoke w/[place] he informed me that the test is being ran and it is a 3 day turn around for it to be done.Laureen Ochs, Viann Shove, CMA

## 2019-04-19 ENCOUNTER — Other Ambulatory Visit: Payer: Self-pay | Admitting: Family Medicine

## 2019-04-28 ENCOUNTER — Ambulatory Visit (INDEPENDENT_AMBULATORY_CARE_PROVIDER_SITE_OTHER): Payer: BC Managed Care – PPO | Admitting: *Deleted

## 2019-04-28 ENCOUNTER — Encounter: Payer: Self-pay | Admitting: *Deleted

## 2019-04-28 ENCOUNTER — Other Ambulatory Visit: Payer: Self-pay

## 2019-04-28 DIAGNOSIS — Z3042 Encounter for surveillance of injectable contraceptive: Secondary | ICD-10-CM | POA: Diagnosis not present

## 2019-04-28 NOTE — Progress Notes (Signed)
Depo Provera injection given IM Lt deltoid.

## 2019-07-15 ENCOUNTER — Other Ambulatory Visit: Payer: Self-pay | Admitting: Family Medicine

## 2019-07-17 ENCOUNTER — Ambulatory Visit (INDEPENDENT_AMBULATORY_CARE_PROVIDER_SITE_OTHER): Payer: BC Managed Care – PPO

## 2019-07-17 ENCOUNTER — Other Ambulatory Visit: Payer: Self-pay

## 2019-07-17 DIAGNOSIS — Z3042 Encounter for surveillance of injectable contraceptive: Secondary | ICD-10-CM

## 2019-07-17 MED ORDER — MEDROXYPROGESTERONE ACETATE 150 MG/ML IM SUSP
150.0000 mg | Freq: Once | INTRAMUSCULAR | Status: AC
  Administered 2019-07-17: 150 mg via INTRAMUSCULAR

## 2019-07-17 NOTE — Progress Notes (Signed)
Pt here for Depo Provera injection. Injection given in left deltoid. Pt will return in 12 weeks.

## 2019-07-21 ENCOUNTER — Ambulatory Visit: Payer: BC Managed Care – PPO

## 2019-07-31 ENCOUNTER — Ambulatory Visit: Payer: BLUE CROSS/BLUE SHIELD | Admitting: Family Medicine

## 2019-08-08 ENCOUNTER — Other Ambulatory Visit: Payer: Self-pay

## 2019-08-08 ENCOUNTER — Encounter: Payer: Self-pay | Admitting: Family Medicine

## 2019-08-08 ENCOUNTER — Ambulatory Visit (INDEPENDENT_AMBULATORY_CARE_PROVIDER_SITE_OTHER): Payer: BC Managed Care – PPO | Admitting: Family Medicine

## 2019-08-08 VITALS — BP 160/87 | HR 60 | Ht 67.0 in | Wt 212.0 lb

## 2019-08-08 DIAGNOSIS — F4329 Adjustment disorder with other symptoms: Secondary | ICD-10-CM | POA: Diagnosis not present

## 2019-08-08 DIAGNOSIS — I10 Essential (primary) hypertension: Secondary | ICD-10-CM | POA: Diagnosis not present

## 2019-08-08 NOTE — Progress Notes (Signed)
Established Patient Office Visit  Subjective:  Patient ID: Connie Mills, female    DOB: April 27, 1976  Age: 43 y.o. MRN: [id number]  CC:  Chief Complaint  Patient presents with  . Hypertension    HPI Connie Mills presents for Hypertension- Pt denies chest pain, SOB, dizziness, or heart palpitations.  Taking meds as directed w/o problems.  Denies medication side effects.  She says she needs refills.  She says she raised her from work after a meeting and so thinks is why her blood pressure is up but she has been checking it at home and it usually looks fantastic and she takes her medications very regularly.  She admits that she has not been eating the best and she has not been exercising.  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  She did let me know that her mother who has advancing dementia/Alzheimer's was recently placed on home hospice.  Her sister is being her primary caretaker but she herself is been trying to help out as much as she can.  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    Past Medical History:  Diagnosis Date  . Abnormal Pap smear of cervix 2005   Leep; normal pap after  . AMA (advanced maternal age) multigravida 35+   . Anemia   . Blood transfusion May 16, 1992   "busted blood vessel behind incision", led to bleeding.  . Blood transfusion complicating pregnancy 1993  . Hypertension    labetolol 200 bid    Past Surgical History:  Procedure Laterality Date  . CERVICAL BIOPSY  W/ LOOP ELECTRODE EXCISION    . CESAREAN SECTION  1993    B[place]ch, blood loss transfusion with "busted blood vessels behind incision"  . WISDOM TOOTH EXTRACTION  2003    Family History  Problem Relation Age of Onset  . Hypertension Mother   . Hypertension Brother   . Diabetes Maternal Grandmother   . Cancer Paternal Grandfather   . Hypertension Sister   .  Thyroid disease Sister   . Thyroid disease Maternal Aunt   . Other Neg Hx     Social History   Socioeconomic History  . Marital status: Married    Spouse name: Not on file  . Number of children: Not on file  . Years of education: Not on file  . Highest education level: Not on file  Occupational History  . Occupation: Curator: [employer]  Social Needs  . Financial resource strain: Not on file  . Food insecurity    Worry: Not on file    Inability: Not on file  . Transportation needs    Medical: Not on file    Non-medical: Not on file  Tobacco Use  . Smoking status: Never Smoker  . Smokeless tobacco: Never Used  Substance and Sexual Activity  . Alcohol use: No  . Drug use: No  . Sexual activity: Yes    Partners: Male    Birth control/protection: Injection    Comment: Depo  Lifestyle  . Physical activity    Days per week: Not on file    Minutes per session: Not on file  . Stress: Not on file  Relationships  . Social Musician on phone: Not on file    Gets together: Not on file    Attends religious service: Not on  file    Active member of club or organization: Not on file    Attends meetings of clubs or organizations: Not on file    Relationship status: Not on file  . Intimate partner violence    Fear of current or ex partner: Not on file    Emotionally abused: Not on file    Physically abused: Not on file    Forced sexual activity: Not on file  Other Topics Concern  . Not on file  Social History Narrative  . Not on file    Outpatient Medications Prior to Visit  Medication Sig Dispense Refill  . amLODipine-benazepril (LOTREL) 10-20 MG capsule TAKE ONE CAPSULE BY MOUTH DAILY 90 capsule 1  . celecoxib (CELEBREX) 200 MG capsule TAKE 1 CAPSULE (200 MG TOTAL) BY MOUTH 2 (TWO) TIMES DAILY. 60 capsule 5  . chlorthalidone (HYGROTON) 25 MG tablet TAKE 1 TABLET BY MOUTH EVERY MORNING 90 tablet 1  . ibuprofen (ADVIL,MOTRIN) 600 MG tablet Take  600 mg by mouth every 6 (six) hours as needed.    . metoprolol succinate (TOPROL-XL) 100 MG 24 hr tablet TAKE 1 TABLET BY MOUTH DAILY( TAKE WITH OR IMMEDIATELY FOLLOWING A MEAL) 90 tablet 1  . valACYclovir (VALTREX) 1000 MG tablet TAKE 1 TABLET(1000 MG) BY MOUTH DAILY 20 tablet 6   No facility-administered medications prior to visit.     Allergies  Allergen Reactions  . Dilaudid [Hydromorphone Hcl] Itching  . Hctz [Hydrochlorothiazide] Other (See Comments)    Doesn't feel well on it    ROS Review of Systems    Objective:    Physical Exam  BP (!) 160/87   Pulse 60   Ht 5\' 7"  (1.702 m)   Wt 212 lb (96.2 kg)   SpO2 100%   BMI 33.20 kg/m  Wt Readings from Last 3 Encounters:  08/08/19 212 lb (96.2 kg)  04/12/19 209 lb (94.8 kg)  01/27/19 207 lb (93.9 kg)     There are no preventive care reminders to display for this patient.  There are no preventive care reminders to display for this patient.  Lab Results  Component Value Date   TSH 1.52 04/12/2019   Lab Results  Component Value Date   WBC 6.5 06/13/2012   HGB 11.1 (L) 06/13/2012   HCT 35.3 (L) 06/13/2012   MCV 73.1 (L) 06/13/2012   PLT 315 06/13/2012   Lab Results  Component Value Date   NA 139 04/12/2019   K 4.1 04/12/2019   CO2 24 04/12/2019   GLUCOSE 79 04/12/2019   BUN 9 04/12/2019   CREATININE 0.92 04/12/2019   BILITOT 0.3 04/12/2019   ALKPHOS 40 03/20/2016   AST 13 04/12/2019   ALT 11 04/12/2019   PROT 7.3 04/12/2019   ALBUMIN 4.2 03/20/2016   CALCIUM 9.7 04/12/2019   Lab Results  Component Value Date   CHOL 196 04/12/2019   Lab Results  Component Value Date   HDL 49 (L) 04/12/2019   Lab Results  Component Value Date   LDLCALC 129 (H) 04/12/2019   Lab Results  Component Value Date   TRIG 85 04/12/2019   Lab Results  Component Value Date   CHOLHDL 4.0 04/12/2019   No results found for: HGBA1C    Assessment & Plan:   Problem List Items Addressed This Visit       Cardiovascular and Mediastinum   Hypertension, essential, benign - Primary    Pressure is elevated and not well controlled today.  She does  have a home blood pressure monitor and says she will check it at home a couple times and then send [place] notification through my chart.  She is up-to-date on her blood work but will be due in November.  She should be good until December for her refills.  Otherwise I will see her back in 6 months.       Other Visit Diagnoses    Stress and adjustment reaction          Stress reaction-she became very tearful during the conversation.  We discussed maybe even getting FMLA to allow her to have some more time with her mother now that she is on hospice..  We also discussed self-care.  She knows what to do she is just really struggling trying to keep everything afloat right now.  Encouraged her to reach out and let us know if there is anything we can be helpful with.   No orders of the defined types were placed in this encounter.   Follow-up: Return in about 6 months (around 02/05/2020) for Hypertension.    Nani Gasser, MD

## 2019-08-08 NOTE — Assessment & Plan Note (Signed)
Pressure is elevated and not well controlled today.  She does have a home blood pressure monitor and says she will check it at home a couple times and then send [place] notification through my chart.  She is up-to-date on her blood work but will be due in November.  She should be good until December for her refills.  Otherwise I will see her back in 6 months.

## 2019-09-12 ENCOUNTER — Other Ambulatory Visit: Payer: Self-pay | Admitting: Family Medicine

## 2019-10-16 ENCOUNTER — Other Ambulatory Visit: Payer: Self-pay | Admitting: *Deleted

## 2019-10-16 DIAGNOSIS — I10 Essential (primary) hypertension: Secondary | ICD-10-CM

## 2019-10-16 MED ORDER — AMLODIPINE BESY-BENAZEPRIL HCL 10-20 MG PO CAPS: 1.0000 | ORAL_CAPSULE | Freq: Every day | ORAL | 1 refills | Status: DC

## 2019-10-16 MED ORDER — CHLORTHALIDONE 25 MG PO TABS: 25.0000 mg | ORAL_TABLET | Freq: Every morning | ORAL | 1 refills | Status: DC

## 2019-10-16 MED ORDER — METOPROLOL SUCCINATE ER 100 MG PO TB24: ORAL_TABLET | ORAL | 1 refills | Status: DC

## 2019-10-30 ENCOUNTER — Ambulatory Visit (INDEPENDENT_AMBULATORY_CARE_PROVIDER_SITE_OTHER): Payer: BC Managed Care – PPO

## 2019-10-30 ENCOUNTER — Other Ambulatory Visit: Payer: Self-pay

## 2019-10-30 DIAGNOSIS — Z3202 Encounter for pregnancy test, result negative: Secondary | ICD-10-CM | POA: Diagnosis not present

## 2019-10-30 DIAGNOSIS — Z3042 Encounter for surveillance of injectable contraceptive: Secondary | ICD-10-CM

## 2019-10-30 LAB — POCT URINE PREGNANCY: Preg Test, Ur: NEGATIVE

## 2019-10-30 NOTE — Progress Notes (Signed)
Pt here for UPT. UPT negative. PT will return in 2 weeks for UPT and Depo Provera injection

## 2019-11-15 ENCOUNTER — Other Ambulatory Visit: Payer: Self-pay

## 2019-11-15 ENCOUNTER — Ambulatory Visit (INDEPENDENT_AMBULATORY_CARE_PROVIDER_SITE_OTHER): Payer: BC Managed Care – PPO

## 2019-11-15 DIAGNOSIS — Z3202 Encounter for pregnancy test, result negative: Secondary | ICD-10-CM

## 2019-11-15 DIAGNOSIS — Z789 Other specified health status: Secondary | ICD-10-CM

## 2019-11-15 DIAGNOSIS — Z3042 Encounter for surveillance of injectable contraceptive: Secondary | ICD-10-CM

## 2019-11-15 LAB — POCT URINE PREGNANCY: Preg Test, Ur: NEGATIVE

## 2019-11-15 MED ORDER — MEDROXYPROGESTERONE ACETATE 150 MG/ML IM SUSP
150.0000 mg | Freq: Once | INTRAMUSCULAR | Status: AC
  Administered 2019-11-15: 150 mg via INTRAMUSCULAR

## 2019-11-15 NOTE — Progress Notes (Signed)
Pt here for Depo Provera injection. Injection given in left deltoid and tolerated well. UPT negative.

## 2020-02-05 ENCOUNTER — Encounter: Payer: Self-pay | Admitting: Family Medicine

## 2020-02-05 ENCOUNTER — Ambulatory Visit: Payer: Self-pay

## 2020-02-05 ENCOUNTER — Ambulatory Visit (INDEPENDENT_AMBULATORY_CARE_PROVIDER_SITE_OTHER): Payer: Self-pay | Admitting: Family Medicine

## 2020-02-05 ENCOUNTER — Other Ambulatory Visit: Payer: Self-pay

## 2020-02-05 VITALS — BP 149/73 | HR 60 | Ht 67.0 in | Wt 211.0 lb

## 2020-02-05 VITALS — BP 157/86 | HR 60 | Ht 67.0 in | Wt 211.1 lb

## 2020-02-05 DIAGNOSIS — F5102 Adjustment insomnia: Secondary | ICD-10-CM

## 2020-02-05 DIAGNOSIS — E785 Hyperlipidemia, unspecified: Secondary | ICD-10-CM

## 2020-02-05 DIAGNOSIS — Z3042 Encounter for surveillance of injectable contraceptive: Secondary | ICD-10-CM

## 2020-02-05 DIAGNOSIS — I10 Essential (primary) hypertension: Secondary | ICD-10-CM

## 2020-02-05 DIAGNOSIS — Z8349 Family history of other endocrine, nutritional and metabolic diseases: Secondary | ICD-10-CM

## 2020-02-05 DIAGNOSIS — F4321 Adjustment disorder with depressed mood: Secondary | ICD-10-CM

## 2020-02-05 MED ORDER — MEDROXYPROGESTERONE ACETATE 150 MG/ML IM SUSP
150.0000 mg | Freq: Once | INTRAMUSCULAR | Status: AC
  Administered 2020-02-05: 150 mg via INTRAMUSCULAR

## 2020-02-05 NOTE — Patient Instructions (Signed)
Continue your chlorthalidone and amlodipine/benaz.  Increase your metoprolol to twice a day and make sure pulse is greater than 50.

## 2020-02-05 NOTE — Assessment & Plan Note (Signed)
Her blood pressure is not well controlled and that ultimately to reduce her risk for stroke and vascular dementia she we really need to get her blood pressure under good control and in the 120s.  We discussed increasing her metoprolol to 200 mg daily we will just need to monitor her pulse and make sure its not dropping less than 50.  When she gets health insurance had like to consider switching her to Edarbiclor with the metoprolol and amlodipine separate.

## 2020-02-05 NOTE — Progress Notes (Signed)
Connie Mills here for Depo-Provera  Injection.  Injection administered without complication. Patient will return in 3 months for next injection.  Connie Mills, CMA 02/05/2020  3:10 PM

## 2020-02-05 NOTE — Progress Notes (Signed)
Established Patient Office Visit  Subjective:  Patient ID: Connie Mills, female    DOB: Feb 07, 1976  Age: 44 y.o. MRN: [id number]  CC:  Chief Complaint  Patient presents with  . Hypertension    HPI Connie Mills presents for HTN.  She did want to let me know about an episode that she had on March 4 when her mother was admitted to the hospital.  She said she really had nothing to eat or drink that day and by the time she got home she had a severe headache she says in fact it was probably the worst that she is ever had in her life.  She checked her blood pressure and it was over 200 systolic.  She says she took a shower try to relax and eat a little bit and she was finally able to get her blood pressure back down into the 140s.  Her mother recently passed away about 2 weeks ago.  She had a stroke in her 46s and had dementia.  She has been grieving.  In fact she just went back to work for the first time today after her mother's passing.  Been having significant problems with sleep she has been mostly relying on Benadryl even before her mom passed away.  She says if she does not take anything she only gets about 3 hours asleep but says the Benadryl does seem to work.  He is on the Depo-Provera injection.  Past Medical History:  Diagnosis Date  . Abnormal Pap smear of cervix 2005   Leep; normal pap after  . AMA (advanced maternal age) multigravida 35+   . Anemia   . Blood transfusion May 16, 1992   "busted blood vessel behind incision", led to bleeding.  . Blood transfusion complicating pregnancy 1993  . Hypertension    labetolol 200 bid    Past Surgical History:  Procedure Laterality Date  . CERVICAL BIOPSY  W/ LOOP ELECTRODE EXCISION    . CESAREAN SECTION  1993    Breech, blood loss transfusion with "busted blood vessels behind incision"  . WISDOM TOOTH EXTRACTION  2003    Family History  Problem Relation Age of Onset  . Hypertension Mother   . Hypertension Brother   .  Diabetes Maternal Grandmother   . Cancer Paternal Grandfather   . Hypertension Sister   . Thyroid disease Sister   . Thyroid disease Maternal Aunt   . Other Neg Hx     Social History   Socioeconomic History  . Marital status: Married    Spouse name: Not on file  . Number of children: Not on file  . Years of education: Not on file  . Highest education level: Not on file  Occupational History  . Occupation: Curator: [employer]  Tobacco Use  . Smoking status: Never Smoker  . Smokeless tobacco: Never Used  Substance and Sexual Activity  . Alcohol use: No  . Drug use: No  . Sexual activity: Yes    Partners: Male    Birth control/protection: Injection    Comment: Depo  Other Topics Concern  . Not on file  Social History Narrative  . Not on file   Social Determinants of Health   Financial Resource Strain:   . Difficulty of Paying Living Expenses:   Food Insecurity:   . Worried About Programme researcher, broadcasting/film/video in the Last Year:   . Barista in the Last Year:   [employer]  Needs:   . Lack of Transportation (Medical):   Marland Kitchen Lack of Transportation (Non-Medical):   Physical Activity:   . Days of Exercise per Week:   . Minutes of Exercise per Session:   Stress:   . Feeling of Stress :   Social Connections:   . Frequency of Communication with Friends and Family:   . Frequency of Social Gatherings with Friends and Family:   . Attends Religious Services:   . Active Member of Clubs or Organizations:   . Attends Banker Meetings:   Marland Kitchen Marital Status:   Intimate Partner Violence:   . Fear of Current or Ex-Partner:   . Emotionally Abused:   Marland Kitchen Physically Abused:   . Sexually Abused:     Outpatient Medications Prior to Visit  Medication Sig Dispense Refill  . celecoxib (CELEBREX) 200 MG capsule TAKE 1 CAPSULE(200 MG) BY MOUTH TWICE DAILY 60 capsule 5  . ibuprofen (ADVIL,MOTRIN) 600 MG tablet Take 600 mg by mouth every 6 (six) hours as needed.     . metoprolol succinate (TOPROL-XL) 100 MG 24 hr tablet TAKE 1 TABLET BY MOUTH DAILY( TAKE WITH OR IMMEDIATELY FOLLOWING A MEAL) 90 tablet 1  . valACYclovir (VALTREX) 1000 MG tablet TAKE 1 TABLET(1000 MG) BY MOUTH DAILY 20 tablet 6  . amLODipine-benazepril (LOTREL) 10-20 MG capsule Take 1 capsule by mouth daily. 90 capsule 1  . chlorthalidone (HYGROTON) 25 MG tablet Take 1 tablet (25 mg total) by mouth every morning. 90 tablet 1   No facility-administered medications prior to visit.    Allergies  Allergen Reactions  . Dilaudid [Hydromorphone Hcl] Itching  . Hctz [Hydrochlorothiazide] Other (See Comments)    Doesn't feel well on it    ROS Review of Systems    Objective:    Physical Exam  BP (!) 149/73   Pulse 60   Ht 5\' 7"  (1.702 m)   Wt 211 lb (95.7 kg)   BMI 33.05 kg/m  Wt Readings from Last 3 Encounters:  02/05/20 211 lb (95.7 kg)  02/05/20 211 lb 1.9 oz (95.8 kg)  08/08/19 212 lb (96.2 kg)     There are no preventive care reminders to display for this patient.  There are no preventive care reminders to display for this patient.  Lab Results  Component Value Date   TSH 1.52 04/12/2019   Lab Results  Component Value Date   WBC 6.5 06/13/2012   HGB 11.1 (L) 06/13/2012   HCT 35.3 (L) 06/13/2012   MCV 73.1 (L) 06/13/2012   PLT 315 06/13/2012   Lab Results  Component Value Date   NA 139 04/12/2019   K 4.1 04/12/2019   CO2 24 04/12/2019   GLUCOSE 79 04/12/2019   BUN 9 04/12/2019   CREATININE 0.92 04/12/2019   BILITOT 0.3 04/12/2019   ALKPHOS 40 03/20/2016   AST 13 04/12/2019   ALT 11 04/12/2019   PROT 7.3 04/12/2019   ALBUMIN 4.2 03/20/2016   CALCIUM 9.7 04/12/2019   Lab Results  Component Value Date   CHOL 196 04/12/2019   Lab Results  Component Value Date   HDL 49 (L) 04/12/2019   Lab Results  Component Value Date   LDLCALC 129 (H) 04/12/2019   Lab Results  Component Value Date   TRIG 85 04/12/2019   Lab Results  Component Value Date    CHOLHDL 4.0 04/12/2019   No results found for: HGBA1C    Assessment & Plan:   Problem List Items Addressed This  Visit      Cardiovascular and Mediastinum   Hypertension, essential, benign - Primary    Her blood pressure is not well controlled and that ultimately to reduce her risk for stroke and vascular dementia she we really need to get her blood pressure under good control and in the 120s.  We discussed increasing her metoprolol to 200 mg daily we will just need to monitor her pulse and make sure its not dropping less than 50.  When she gets health insurance had like to consider switching her to Edarbiclor with the metoprolol and amlodipine separate.      Relevant Orders   TSH   COMPLETE METABOLIC PANEL WITH GFR   Lipid panel     Other   Hyperlipidemia    Due to recheck labs next month.  Again okay to wait till she gets health insurance.      Relevant Orders   TSH   Lipid panel   Family history of thyroid disease   Relevant Orders   TSH    Other Visit Diagnoses    Grief       Adjustment insomnia          History of thyroid disease-we will recheck TSH.  Family history of dementia in her mother-did discuss that the things that are in her control are healthy diet, regular exercise and really doing a good job of controlling her blood pressure now and in the future to reduce her risk for vascular dementia.  Insomnia-discussed options.  Right now Benadryl actually seems to be working really well for her so okay to continue it is safe to use it daily if needed.  We also discussed some strategies around sleep hygiene including setting routine bedtime and wake times, no electronic devices an hour before bed, doing some journaling and relaxation techniques.  No orders of the defined types were placed in this encounter.   Follow-up: Return in about 6 weeks (around 03/18/2020) for BP check .    Nani Gasser, MD

## 2020-02-05 NOTE — Assessment & Plan Note (Signed)
Due to recheck labs next month.  Again okay to wait till she gets health insurance.

## 2020-02-13 ENCOUNTER — Telehealth: Payer: Self-pay

## 2020-02-13 NOTE — Telephone Encounter (Signed)
Yes I agree. 

## 2020-02-13 NOTE — Telephone Encounter (Signed)
Connie Mills wanted to know if Dr Linford Arnold thought it would be ok to get the covid vaccine.   Patient advised-  Our providers are recommending the vaccine to all our patients, especially if you are a patient who has medical conditions that make you high-risk for serious COVID disease.   As of right now there is NO known contraindication to getting the COVID vaccine. Meaning: everyone should be able to get this vaccine, even if they have other medical conditions or are pregnant. Side effects are always possible, but as with all vaccines, we believe the benefits outweigh the risks.

## 2020-03-19 ENCOUNTER — Ambulatory Visit: Payer: Self-pay | Admitting: Family Medicine

## 2020-04-19 ENCOUNTER — Telehealth: Payer: Self-pay | Admitting: General Practice

## 2020-04-19 NOTE — Telephone Encounter (Signed)
Patient called and stated that she will get her next Depo injection with her PCP, Dr. Linford Arnold due to not having insurance and it will be one bill instead of two.

## 2020-04-29 ENCOUNTER — Ambulatory Visit: Payer: Self-pay | Admitting: Family Medicine

## 2020-05-06 ENCOUNTER — Other Ambulatory Visit: Payer: Self-pay

## 2020-05-06 ENCOUNTER — Encounter: Payer: Self-pay | Admitting: Family Medicine

## 2020-05-06 ENCOUNTER — Ambulatory Visit (INDEPENDENT_AMBULATORY_CARE_PROVIDER_SITE_OTHER): Payer: Self-pay | Admitting: Family Medicine

## 2020-05-06 VITALS — BP 147/69 | HR 51 | Ht 67.0 in | Wt 217.0 lb

## 2020-05-06 DIAGNOSIS — F4321 Adjustment disorder with depressed mood: Secondary | ICD-10-CM

## 2020-05-06 DIAGNOSIS — I10 Essential (primary) hypertension: Secondary | ICD-10-CM

## 2020-05-06 DIAGNOSIS — F5102 Adjustment insomnia: Secondary | ICD-10-CM

## 2020-05-06 DIAGNOSIS — E785 Hyperlipidemia, unspecified: Secondary | ICD-10-CM

## 2020-05-06 MED ORDER — AMLODIPINE BESY-BENAZEPRIL HCL 10-40 MG PO CAPS: 1.0000 | ORAL_CAPSULE | Freq: Every day | ORAL | 3 refills | Status: DC

## 2020-05-06 MED ORDER — METOPROLOL SUCCINATE ER 100 MG PO TB24: ORAL_TABLET | ORAL | 5 refills | Status: DC

## 2020-05-06 NOTE — Progress Notes (Signed)
Established Patient Office Visit  Subjective:  Patient ID: Connie Mills, female    DOB: [birth year]  Age: 44 y.o. MRN: [id number]  CC:  Chief Complaint  Patient presents with  . Hypertension    HPI Connie Mills presents for follow-up hypertension.  She has been taking her medication regularly she did try going up the metoprolol to 1 tab twice a day for about 2 weeks but she felt extremely tired and says her pulse actually dropped into the 40s so she went back to 1 a day.  She is not having any recent chest pain or shortness of breath.  She still struggling with her sleep since the loss of her mom she still waking up around 2 or 3 AM and then it usually takes a couple hours to fall back asleep.  She says Benadryl helps some initially but then quit working.  She also tried a hemp supplement and that did not help she actually just ordered some melatonin's, try that for a little while to.  He is currently without health insurance and so needs 30-day supply sent to the pharmacy for now she said hopefully her insurance will kick in in the next 30 to 60 days and then we can always switch back to 90 days and she can get her blood work done at that time.  Past Medical History:  Diagnosis Date  . Abnormal Pap smear of cervix 2005   Leep; normal pap after  . AMA (advanced maternal age) multigravida 35+   . Anemia   . Blood transfusion May 16, 1992   "busted blood vessel behind incision", led to bleeding.  . Blood transfusion complicating pregnancy 1993  . Hypertension    labetolol 200 bid    Past Surgical History:  Procedure Laterality Date  . CERVICAL BIOPSY  W/ LOOP ELECTRODE EXCISION    . CESAREAN SECTION  1993    Breech, blood loss transfusion with "busted blood vessels behind incision"  . WISDOM TOOTH EXTRACTION  2003    Family History  Problem Relation Age of Onset  . Hypertension Mother   . Hypertension Brother   . Diabetes Maternal Grandmother   . Cancer Paternal  Grandfather   . Hypertension Sister   . Thyroid disease Sister   . Thyroid disease Maternal Aunt   . Other Neg Hx     Social History   Socioeconomic History  . Marital status: Married    Spouse name: Not on file  . Number of children: Not on file  . Years of education: Not on file  . Highest education level: Not on file  Occupational History  . Occupation: Curator: [employer]  Tobacco Use  . Smoking status: Never Smoker  . Smokeless tobacco: Never Used  Substance and Sexual Activity  . Alcohol use: No  . Drug use: No  . Sexual activity: Yes    Partners: Male    Birth control/protection: Injection    Comment: Depo  Other Topics Concern  . Not on file  Social History Narrative  . Not on file   Social Determinants of Health   Financial Resource Strain:   . Difficulty of Paying Living Expenses:   Food Insecurity:   . Worried About Programme researcher, broadcasting/film/video in the Last Year:   . Barista in the Last Year:   Transportation Needs:   . Freight forwarder (Medical):   Marland Kitchen Lack of Transportation (Non-Medical):   Physical Activity:   .  Days of Exercise per Week:   . Minutes of Exercise per Session:   Stress:   . Feeling of Stress :   Social Connections:   . Frequency of Communication with Friends and Family:   . Frequency of Social Gatherings with Friends and Family:   . Attends Religious Services:   . Active Member of Clubs or Organizations:   . Attends Banker Meetings:   Marland Kitchen Marital Status:   Intimate Partner Violence:   . Fear of Current or Ex-Partner:   . Emotionally Abused:   Marland Kitchen Physically Abused:   . Sexually Abused:     Outpatient Medications Prior to Visit  Medication Sig Dispense Refill  . AMBULATORY NON FORMULARY MEDICATION Medication Name: CBD GUMMIES    . celecoxib (CELEBREX) 200 MG capsule TAKE 1 CAPSULE(200 MG) BY MOUTH TWICE DAILY 60 capsule 5  . ibuprofen (ADVIL,MOTRIN) 600 MG tablet Take 600 mg by mouth every 6  (six) hours as needed.    . valACYclovir (VALTREX) 1000 MG tablet TAKE 1 TABLET(1000 MG) BY MOUTH DAILY 20 tablet 6  . metoprolol succinate (TOPROL-XL) 100 MG 24 hr tablet TAKE 1 TABLET BY MOUTH DAILY( TAKE WITH OR IMMEDIATELY FOLLOWING A MEAL) 90 tablet 1  . amLODipine-benazepril (LOTREL) 10-20 MG capsule Take 1 capsule by mouth daily. 90 capsule 1   No facility-administered medications prior to visit.    Allergies  Allergen Reactions  . Dilaudid [Hydromorphone Hcl] Itching  . Hctz [Hydrochlorothiazide] Other (See Comments)    Doesn't feel well on it    ROS Review of Systems    Objective:    Physical Exam  BP (!) 147/69   Pulse (!) 51   Ht 5\' 7"  (1.702 m)   Wt 217 lb (98.4 kg)   SpO2 100%   BMI 33.99 kg/m  Wt Readings from Last 3 Encounters:  05/06/20 217 lb (98.4 kg)  02/05/20 211 lb (95.7 kg)  02/05/20 211 lb 1.9 oz (95.8 kg)     Health Maintenance Due  Topic Date Due  . Hepatitis C Screening  Never done    There are no preventive care reminders to display for this patient.  Lab Results  Component Value Date   TSH 1.52 04/12/2019   Lab Results  Component Value Date   WBC 6.5 06/13/2012   HGB 11.1 (L) 06/13/2012   HCT 35.3 (L) 06/13/2012   MCV 73.1 (L) 06/13/2012   PLT 315 06/13/2012   Lab Results  Component Value Date   NA 139 04/12/2019   K 4.1 04/12/2019   CO2 24 04/12/2019   GLUCOSE 79 04/12/2019   BUN 9 04/12/2019   CREATININE 0.92 04/12/2019   BILITOT 0.3 04/12/2019   ALKPHOS 40 03/20/2016   AST 13 04/12/2019   ALT 11 04/12/2019   PROT 7.3 04/12/2019   ALBUMIN 4.2 03/20/2016   CALCIUM 9.7 04/12/2019   Lab Results  Component Value Date   CHOL 196 04/12/2019   Lab Results  Component Value Date   HDL 49 (L) 04/12/2019   Lab Results  Component Value Date   LDLCALC 129 (H) 04/12/2019   Lab Results  Component Value Date   TRIG 85 04/12/2019   Lab Results  Component Value Date   CHOLHDL 4.0 04/12/2019   No results found for:  HGBA1C    Assessment & Plan:   Problem List Items Addressed This Visit      Cardiovascular and Mediastinum   Hypertension, essential, benign - Primary  Pressure still little elevated today.  Recommend try increasing her Lotrel to 10/40 from 10/20.  I think she will do well.  Continue with metoprolol once a day.  Follow-up in 3 months.      Relevant Medications   amLODipine-benazepril (LOTREL) 10-40 MG capsule   metoprolol succinate (TOPROL-XL) 100 MG 24 hr tablet     Other   Hyperlipidemia with target LDL less than 100    Due to recheck lipids.      Relevant Medications   amLODipine-benazepril (LOTREL) 10-40 MG capsule   metoprolol succinate (TOPROL-XL) 100 MG 24 hr tablet    Other Visit Diagnoses    Grief       Adjustment insomnia         Adjustment insomnia-has already tried Benadryl and he will now try melatonin could also consider valerian root if those still are helpful could consider prescription medication if needed.   Grief-she feels like overall she is starting to adjust she is able to talk about her mom without getting tearful.  She did get the paperwork to see a therapist or counselor's but says she held off on actually completing it.  Meds ordered this encounter  Medications  . amLODipine-benazepril (LOTREL) 10-40 MG capsule    Sig: Take 1 capsule by mouth daily.    Dispense:  30 capsule    Refill:  3  . metoprolol succinate (TOPROL-XL) 100 MG 24 hr tablet    Sig: TAKE 1 TABLET BY MOUTH DAILY( TAKE WITH OR IMMEDIATELY FOLLOWING A MEAL)    Dispense:  30 tablet    Refill:  5    Follow-up: Return in about 3 months (around 08/06/2020) for Hypertension.    Nani Gasser, MD

## 2020-05-06 NOTE — Assessment & Plan Note (Signed)
Pressure still little elevated today.  Recommend try increasing her Lotrel to 10/40 from 10/20.  I think she will do well.  Continue with metoprolol once a day.  Follow-up in 3 months.

## 2020-05-06 NOTE — Assessment & Plan Note (Signed)
Due to recheck lipids. 

## 2020-06-05 ENCOUNTER — Encounter: Payer: Self-pay | Admitting: Family Medicine

## 2020-06-05 NOTE — Telephone Encounter (Signed)
Form placed in providers mailbox for completion.

## 2020-06-07 ENCOUNTER — Telehealth: Payer: Self-pay

## 2020-06-07 NOTE — Telephone Encounter (Signed)
Yes ok 

## 2020-06-07 NOTE — Telephone Encounter (Signed)
Connie Mills would like a quantiferon gold test along with fasting labs. Is it ok to order?

## 2020-06-10 NOTE — Telephone Encounter (Signed)
Patient states she had the PPD with urgent care.

## 2020-06-11 ENCOUNTER — Encounter: Payer: Self-pay | Admitting: Family Medicine

## 2020-07-10 ENCOUNTER — Encounter: Payer: Self-pay | Admitting: Physician Assistant

## 2020-07-10 ENCOUNTER — Telehealth (INDEPENDENT_AMBULATORY_CARE_PROVIDER_SITE_OTHER): Payer: Self-pay | Admitting: Physician Assistant

## 2020-07-10 VITALS — BP 159/72 | HR 64 | Temp 97.7°F | Ht 67.0 in | Wt 217.0 lb

## 2020-07-10 DIAGNOSIS — J029 Acute pharyngitis, unspecified: Secondary | ICD-10-CM

## 2020-07-10 DIAGNOSIS — I10 Essential (primary) hypertension: Secondary | ICD-10-CM

## 2020-07-10 DIAGNOSIS — R6883 Chills (without fever): Secondary | ICD-10-CM

## 2020-07-10 DIAGNOSIS — Z20822 Contact with and (suspected) exposure to covid-19: Secondary | ICD-10-CM

## 2020-07-10 DIAGNOSIS — R197 Diarrhea, unspecified: Secondary | ICD-10-CM

## 2020-07-10 DIAGNOSIS — R0981 Nasal congestion: Secondary | ICD-10-CM

## 2020-07-10 NOTE — Progress Notes (Signed)
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

## 2020-07-10 NOTE — Progress Notes (Signed)
Patient ID: Connie Mills, female   DOB: [birth year], 44 y.o.   MRN: [id number] .Marland KitchenVirtual Visit via Video Note  I connected with [name] on 07/10/20 at  1:00 PM EDT by a video enabled telemedicine application and verified that I am speaking with the correct person using two identifiers.  Location: Patient: home Provider:clinic   I discussed the limitations of evaluation and management by telemedicine and the availability of in person appointments. The patient expressed understanding and agreed to proceed.  History of Present Illness: Patient is a 44 year old female with hypertension who calls into the clinic with viral symptoms and close Covid positive exposure.  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  During the night patient started having a sore throat, congestion, sweats and chills, diarrhea.  She denies any loss of smell or taste, shortness of breath, wheezing.  Patient has been vaccinated for Covid.  She has not taken anything to make symptoms better.  She denies any fever.   .. Active Ambulatory Problems    Diagnosis Date Noted  . Anemia 02/01/2012  . Hypertension, essential, benign 12/05/2012  . Hyperlipidemia with target LDL less than 100 03/06/2014  . Heart murmur 04/03/2014  . Recurrent oral herpes simplex 08/07/2014  . Atypical squamous cells of undetermined significance (ASCUS) on Papanicolaou smear of cervix 09/15/2014  . Family history of thyroid disease 03/20/2016  . Hyperlipidemia 03/20/2016  . Uncontrolled hypertension 09/10/2017   Resolved Ambulatory Problems    Diagnosis Date Noted  . Elderly multigravida with antepartum condition or complication 10/23/2011  . Chronic hypertension in pregnancy 10/27/2011  . Echogenic focus of heart of fetus affecting antepartum care of mother 12/28/2011  . History of C-section 12/28/2011  . Intrauterine growth restriction affecting  care of mother, antepartum 02/22/2012  . IUD (intrauterine device) in place 09/10/2014   Past Medical History:  Diagnosis Date  . Abnormal Pap smear of cervix 2005  . AMA (advanced maternal age) multigravida 35+   . Blood transfusion May 16, 1992  . Blood transfusion complicating pregnancy 1993  . Hypertension    Reviewed med, allergy, problem list.    Observations/Objective: No acute distress No cough Normal breathing.  Normal appearance and mood.   .. Today's Vitals   07/10/20 1126  BP: (!) 159/72  Pulse: 64  Temp: 97.7 F (36.5 C)  TempSrc: Oral  Weight: 217 lb (98.4 kg)  Height: 5\' 7"  (1.702 m)   Body mass index is 33.99 kg/m.    Assessment and Plan: Marland KitchenMarland KitchenLynasia was seen today for covid exposure and nasal congestion.  Diagnoses and all orders for this visit:  Close exposure to COVID-19 virus -     Novel Coronavirus, NAA ([facility])  Nasal congestion -     Novel Coronavirus, NAA ([facility])  Chills -     Novel Coronavirus, NAA ([facility])  Diarrhea, unspecified type -     Novel Coronavirus, NAA ([facility])  Sore throat -     Novel Coronavirus, NAA ([facility])  Hypertension, essential, benign   Pt did not take BP medication yet today. Encouraged to take. Come get covid tested. Quarantine until results. Symptomatic treatment discussed with Ibuprofen, tylenol, mucinex, zinc, vitamin C, delsym. Rest and hydrate. Call with any new or worsening symptoms.    Follow Up Instructions:    I discussed the assessment and treatment plan with the patient. The patient was provided an opportunity to ask questions and  all were answered. The patient agreed with the plan and demonstrated an understanding of the instructions.   The patient was advised to call back or seek an in-person evaluation if the symptoms worsen or if the condition fails to improve as anticipated.  I provided 7 minutes of non-face-to-face time during this encounter.   Tandy Gaw, PA-C

## 2020-07-12 ENCOUNTER — Encounter: Payer: Self-pay | Admitting: Physician Assistant

## 2020-07-13 LAB — SARS-COV-2, NAA 2 DAY TAT

## 2020-07-13 LAB — NOVEL CORONAVIRUS, NAA: SARS-CoV-2, NAA: NOT DETECTED

## 2020-07-13 LAB — SPECIMEN STATUS REPORT

## 2020-07-15 ENCOUNTER — Encounter: Payer: Self-pay | Admitting: Physician Assistant

## 2020-07-15 NOTE — Progress Notes (Signed)
Connie Mills,   You are negative for covid. How are you feeling today?

## 2020-07-16 ENCOUNTER — Other Ambulatory Visit: Payer: Self-pay | Admitting: Family Medicine

## 2020-07-16 NOTE — Telephone Encounter (Signed)
Of for note to go back to work 10 days from symptoms and she has negative test.

## 2020-07-16 NOTE — Telephone Encounter (Signed)
Note written

## 2020-07-24 ENCOUNTER — Telehealth: Payer: Self-pay

## 2020-07-24 NOTE — Progress Notes (Signed)
Pt is here for a Depo Provera injection. She brought in her own vial for this injection.  Denies chest pain, shortness of breath, headaches, mood changes or problems with medication. She is within injection window. Tolerated injection today in [facility] well, no immediate complication. Advised to scheduled next depo appointment. Informed to RTC between 9/6-9/20/2021 for next injection. No further questions or concerns.

## 2020-07-24 NOTE — Telephone Encounter (Signed)
Will document in her chart.

## 2020-07-24 NOTE — Telephone Encounter (Signed)
Connie Mills states she received a Depo Provera injection on 05/06/20. She can't proceed with next injection without the documentation of last injection.

## 2020-08-01 ENCOUNTER — Other Ambulatory Visit: Payer: Self-pay

## 2020-08-01 ENCOUNTER — Ambulatory Visit (INDEPENDENT_AMBULATORY_CARE_PROVIDER_SITE_OTHER): Payer: BC Managed Care – PPO | Admitting: *Deleted

## 2020-08-01 DIAGNOSIS — Z3042 Encounter for surveillance of injectable contraceptive: Secondary | ICD-10-CM

## 2020-08-01 MED ORDER — MEDROXYPROGESTERONE ACETATE 150 MG/ML IM SUSP
150.0000 mg | Freq: Once | INTRAMUSCULAR | Status: AC
  Administered 2020-08-01: 150 mg via INTRAMUSCULAR

## 2020-08-01 NOTE — Progress Notes (Signed)
Pt here for Depo Provera injection.  Last injection given by PCP.  Pt is overdue for her annual is scheduled for Christmas break

## 2020-08-06 ENCOUNTER — Ambulatory Visit (INDEPENDENT_AMBULATORY_CARE_PROVIDER_SITE_OTHER): Payer: BC Managed Care – PPO | Admitting: Family Medicine

## 2020-08-06 ENCOUNTER — Other Ambulatory Visit: Payer: Self-pay

## 2020-08-06 ENCOUNTER — Encounter: Payer: Self-pay | Admitting: Family Medicine

## 2020-08-06 VITALS — BP 138/81 | HR 88 | Ht 67.0 in | Wt 215.0 lb

## 2020-08-06 DIAGNOSIS — Z23 Encounter for immunization: Secondary | ICD-10-CM

## 2020-08-06 DIAGNOSIS — E785 Hyperlipidemia, unspecified: Secondary | ICD-10-CM | POA: Diagnosis not present

## 2020-08-06 DIAGNOSIS — I10 Essential (primary) hypertension: Secondary | ICD-10-CM | POA: Diagnosis not present

## 2020-08-06 DIAGNOSIS — R519 Headache, unspecified: Secondary | ICD-10-CM

## 2020-08-06 MED ORDER — CHLORTHALIDONE 25 MG PO TABS: 25.0000 mg | ORAL_TABLET | Freq: Every day | ORAL | 1 refills | Status: DC

## 2020-08-06 MED ORDER — METOPROLOL SUCCINATE ER 100 MG PO TB24: ORAL_TABLET | ORAL | 1 refills | Status: DC

## 2020-08-06 MED ORDER — AMLODIPINE BESY-BENAZEPRIL HCL 10-40 MG PO CAPS: 1.0000 | ORAL_CAPSULE | Freq: Every day | ORAL | 1 refills | Status: DC

## 2020-08-06 NOTE — Progress Notes (Signed)
Established Patient Office Visit  Subjective:  Patient ID: Connie Mills, female    DOB: 22-Apr-1976  Age: 44 y.o. MRN: [id number]  CC:  Chief Complaint  Patient presents with  . Hypertension      HPI Connie Mills presents for   Hypertension- Pt denies chest pain, SOB, dizziness, or heart palpitations.  Taking meds as directed w/o problems.  Denies medication side effects.  We had increased her Lotrel in June.  She is also still on metoprolol.  Using Tylenol frequently during the week to help with HA.  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  So she is been taking Tylenol every morning but it has helped control her daily headache.  She says she has not used it on the weekends.  Past Medical History:  Diagnosis Date  . Abnormal Pap smear of cervix 2005   Leep; normal pap after  . AMA (advanced maternal age) multigravida 35+   . Anemia   . Blood transfusion May 16, 1992   "busted blood vessel behind incision", led to bleeding.  . Blood transfusion complicating pregnancy 1993  . Hypertension    labetolol 200 bid    Past Surgical History:  Procedure Laterality Date  . CERVICAL BIOPSY  W/ LOOP ELECTRODE EXCISION    . CESAREAN SECTION  1993    Breech, blood loss transfusion with "busted blood vessels behind incision"  . WISDOM TOOTH EXTRACTION  2003    Family History  Problem Relation Age of Onset  . Hypertension Mother   . Hypertension Brother   . Diabetes Maternal Grandmother   . Cancer Paternal Grandfather   . Hypertension Sister   . Thyroid disease Sister   . Thyroid disease Maternal Aunt   . Other Neg Hx     Social History   Socioeconomic History  . Marital status: Married    Spouse name: Not on file  . Number of children: Not on file  . Years of education: Not on file  . Highest education level: Not on file  Occupational History  . Occupation: Curator: [employer]  Tobacco Use  . Smoking status: Never Smoker   . Smokeless tobacco: Never Used  Substance and Sexual Activity  . Alcohol use: No  . Drug use: No  . Sexual activity: Yes    Partners: Male    Birth control/protection: Injection    Comment: Depo  Other Topics Concern  . Not on file  Social History Narrative  . Not on file   Social Determinants of Health   Financial Resource Strain:   . Difficulty of Paying Living Expenses: Not on file  Food Insecurity:   . Worried About Programme researcher, broadcasting/film/video in the Last Year: Not on file  . Ran Out of Food in the Last Year: Not on file  Transportation Needs:   . Lack of Transportation (Medical): Not on file  . Lack of Transportation (Non-Medical): Not on file  Physical Activity:   . Days of Exercise per Week: Not on file  . Minutes of Exercise per Session: Not on file  Stress:   . Feeling of Stress : Not on file  Social Connections:   . Frequency of Communication with Friends and Family: Not on file  . Frequency of Social Gatherings with Friends and Family: Not on file  . Attends Religious Services: Not on file  . Active Member of Clubs or Organizations: Not on file  .  Attends Banker Meetings: Not on file  . Marital Status: Not on file  Intimate Partner Violence:   . Fear of Current or Ex-Partner: Not on file  . Emotionally Abused: Not on file  . Physically Abused: Not on file  . Sexually Abused: Not on file    Outpatient Medications Prior to Visit  Medication Sig Dispense Refill  . AMBULATORY NON FORMULARY MEDICATION Medication Name: CBD GUMMIES    . celecoxib (CELEBREX) 200 MG capsule TAKE 1 CAPSULE(200 MG) BY MOUTH TWICE DAILY 60 capsule 5  . medroxyPROGESTERone (DEPO-PROVERA) 150 MG/ML injection Inject 150 mg into the muscle every 3 (three) months.    . valACYclovir (VALTREX) 1000 MG tablet TAKE 1 TABLET(1000 MG) BY MOUTH DAILY 20 tablet 6  . amLODipine-benazepril (LOTREL) 10-40 MG capsule Take 1 capsule by mouth daily. 30 capsule 3  . ibuprofen (ADVIL,MOTRIN) 600  MG tablet Take 600 mg by mouth every 6 (six) hours as needed.    . metoprolol succinate (TOPROL-XL) 100 MG 24 hr tablet TAKE 1 TABLET BY MOUTH DAILY( TAKE WITH OR IMMEDIATELY FOLLOWING A MEAL) 30 tablet 5   No facility-administered medications prior to visit.    Allergies  Allergen Reactions  . Dilaudid [Hydromorphone Hcl] Itching  . Hctz [Hydrochlorothiazide] Other (See Comments)    Doesn't feel well on it    ROS Review of Systems    Objective:    Physical Exam Constitutional:      Appearance: She is well-developed.  HENT:     Head: Normocephalic and atraumatic.  Cardiovascular:     Rate and Rhythm: Normal rate and regular rhythm.     Heart sounds: Normal heart sounds.  Pulmonary:     Effort: Pulmonary effort is normal.     Breath sounds: Normal breath sounds.  Skin:    General: Skin is warm and dry.  Neurological:     Mental Status: She is alert and oriented to person, place, and time.  Psychiatric:        Behavior: Behavior normal.     BP 138/81   Pulse 88   Ht 5\' 7"  (1.702 m)   Wt 215 lb (97.5 kg)   SpO2 100%   BMI 33.67 kg/m  Wt Readings from Last 3 Encounters:  08/06/20 215 lb (97.5 kg)  07/10/20 217 lb (98.4 kg)  05/06/20 217 lb (98.4 kg)     Health Maintenance Due  Topic Date Due  . Hepatitis C Screening  Never done    There are no preventive care reminders to display for this patient.  Lab Results  Component Value Date   TSH 1.52 04/12/2019   Lab Results  Component Value Date   WBC 6.5 06/13/2012   HGB 11.1 (L) 06/13/2012   HCT 35.3 (L) 06/13/2012   MCV 73.1 (L) 06/13/2012   PLT 315 06/13/2012   Lab Results  Component Value Date   NA 139 04/12/2019   K 4.1 04/12/2019   CO2 24 04/12/2019   GLUCOSE 79 04/12/2019   BUN 9 04/12/2019   CREATININE 0.92 04/12/2019   BILITOT 0.3 04/12/2019   ALKPHOS 40 03/20/2016   AST 13 04/12/2019   ALT 11 04/12/2019   PROT 7.3 04/12/2019   ALBUMIN 4.2 03/20/2016   CALCIUM 9.7 04/12/2019   Lab  Results  Component Value Date   CHOL 196 04/12/2019   Lab Results  Component Value Date   HDL 49 (L) 04/12/2019   Lab Results  Component Value Date   LDLCALC  129 (H) 04/12/2019   Lab Results  Component Value Date   TRIG 85 04/12/2019   Lab Results  Component Value Date   CHOLHDL 4.0 04/12/2019   No results found for: HGBA1C    Assessment & Plan:   Problem List Items Addressed This Visit      Cardiovascular and Mediastinum   Hypertension, essential, benign - Primary    Well controlled. Continue current regimen. Follow up in  6 mo due for labs.  RF sent to pharmacy      Relevant Medications   amLODipine-benazepril (LOTREL) 10-40 MG capsule   metoprolol succinate (TOPROL-XL) 100 MG 24 hr tablet   chlorthalidone (HYGROTON) 25 MG tablet   Other Relevant Orders   CBC   COMPLETE METABOLIC PANEL WITH GFR   Lipid panel   Hemoglobin A1c     Other   Hyperlipidemia with target LDL less than 100    Not currently on medication but due to recheck lipids.      Relevant Medications   amLODipine-benazepril (LOTREL) 10-40 MG capsule   metoprolol succinate (TOPROL-XL) 100 MG 24 hr tablet   chlorthalidone (HYGROTON) 25 MG tablet   Frequent headaches    Frequent headaches-did encourage her to limit the use of Tylenol.  Work on her techniques for decreasing stress and tension.  Call if headaches are persisting.  Encouraged her to start working on getting into a regular exercise routine.      Relevant Medications   metoprolol succinate (TOPROL-XL) 100 MG 24 hr tablet    Other Visit Diagnoses    Need for immunization against influenza       Relevant Orders   Flu Vaccine QUAD 36+ mos IM (Completed)     Fly vac given today.   She has scheduled her mammogram for later this fall.  Meds ordered this encounter  Medications  . amLODipine-benazepril (LOTREL) 10-40 MG capsule    Sig: Take 1 capsule by mouth daily.    Dispense:  90 capsule    Refill:  1  . metoprolol succinate  (TOPROL-XL) 100 MG 24 hr tablet    Sig: TAKE 1 TABLET BY MOUTH DAILY( TAKE WITH OR IMMEDIATELY FOLLOWING A MEAL)    Dispense:  90 tablet    Refill:  1  . chlorthalidone (HYGROTON) 25 MG tablet    Sig: Take 1 tablet (25 mg total) by mouth daily.    Dispense:  90 tablet    Refill:  1    Follow-up: Return in about 6 months (around 02/03/2021) for Hypertension.    Nani Gasser, MD

## 2020-08-06 NOTE — Assessment & Plan Note (Signed)
Frequent headaches-did encourage her to limit the use of Tylenol.  Work on her techniques for decreasing stress and tension.  Call if headaches are persisting.  Encouraged her to start working on getting into a regular exercise routine.

## 2020-08-06 NOTE — Assessment & Plan Note (Signed)
Not currently on medication but due to recheck lipids.

## 2020-08-06 NOTE — Assessment & Plan Note (Signed)
Well controlled. Continue current regimen. Follow up in  6 mo due for labs.  RF sent to pharmacy

## 2020-08-08 ENCOUNTER — Ambulatory Visit: Payer: Self-pay

## 2020-08-27 ENCOUNTER — Telehealth (INDEPENDENT_AMBULATORY_CARE_PROVIDER_SITE_OTHER): Payer: BC Managed Care – PPO | Admitting: Family Medicine

## 2020-08-27 ENCOUNTER — Encounter: Payer: Self-pay | Admitting: Family Medicine

## 2020-08-27 DIAGNOSIS — J0100 Acute maxillary sinusitis, unspecified: Secondary | ICD-10-CM | POA: Diagnosis not present

## 2020-08-27 MED ORDER — AMOXICILLIN-POT CLAVULANATE 875-125 MG PO TABS: 1.0000 | ORAL_TABLET | Freq: Two times a day (BID) | ORAL | 0 refills | Status: DC

## 2020-08-27 NOTE — Progress Notes (Signed)
Symptoms started Thursday:  Muffled ears Chest pressure Cough Sweats Sore throat Head pressure  Pt feels it may be a sinus infection.

## 2020-08-27 NOTE — Assessment & Plan Note (Signed)
Symptoms consistent with sinusitis.  Will cover with augmentin.  She did have COVID test this morning as well but somewhat atypical of COVID without fever or other systemic symptoms.   She will continue mucinex and encouraged increased fluid intake.  She may use delsym if needed for cough.  Instructed to let us know if COVID test returns positive or she has worsening of symptoms.

## 2020-08-27 NOTE — Progress Notes (Signed)
Connie Mills - 44 y.o. female MRN [id number]  Date of birth: Mar 28, 1976   This visit type was conducted due to national recommendations for restrictions regarding the COVID-19 Pandemic (e.g. social distancing).  This format is felt to be most appropriate for this patient at this time.  All issues noted in this document were discussed and addressed.  No physical exam was performed (except for noted visual exam findings with Video Visits).  I discussed the limitations of evaluation and management by telemedicine and the availability of in person appointments. The patient expressed understanding and agreed to proceed.  I connected with@ on 08/27/20 at  1:40 PM EDT by a video enabled telemedicine application and verified that I am speaking with the correct person using two identifiers.  Present at visit: Everrett Coombe, DO [name]   Patient Location: Home [address] [place] [address]   Provider location:   [facility]  No chief complaint on file.   HPI  Connie Mills is a 44 y.o. female who presents via audio/video conferencing for a telehealth visit today.  She has complaint of congestion, sinus pain/pressure, frontal headache, ear pressure and mild cough.  She denies fever, chills, shortness of breath, nausea, vomiting, diarrhea.  She is uring OTC mucinex but can't seem to blow anything out. The mucus she has been able to blow out is thick and yellow.  She states that she often will get sinus infections around seasonal changes.     ROS:  A comprehensive ROS was completed and negative except as noted per HPI  Past Medical History:  Diagnosis Date  . Abnormal Pap smear of cervix 2005   Leep; normal pap after  . AMA (advanced maternal age) multigravida 35+   . Anemia   . Blood transfusion May 16, 1992   "busted blood vessel behind incision", led to bleeding.  . Blood transfusion complicating pregnancy 1993  . Hypertension    labetolol 200 bid    Past Surgical History:   Procedure Laterality Date  . CERVICAL BIOPSY  W/ LOOP ELECTRODE EXCISION    . CESAREAN SECTION  1993    Breech, blood loss transfusion with "busted blood vessels behind incision"  . WISDOM TOOTH EXTRACTION  2003    Family History  Problem Relation Age of Onset  . Hypertension Mother   . Hypertension Brother   . Diabetes Maternal Grandmother   . Cancer Paternal Grandfather   . Hypertension Sister   . Thyroid disease Sister   . Thyroid disease Maternal Aunt   . Other Neg Hx     Social History   Socioeconomic History  . Marital status: Married    Spouse name: Not on file  . Number of children: Not on file  . Years of education: Not on file  . Highest education level: Not on file  Occupational History  . Occupation: Curator: [employer]  Tobacco Use  . Smoking status: Never Smoker  . Smokeless tobacco: Never Used  Substance and Sexual Activity  . Alcohol use: No  . Drug use: No  . Sexual activity: Yes    Partners: Male    Birth control/protection: Injection    Comment: Depo  Other Topics Concern  . Not on file  Social History Narrative  . Not on file   Social Determinants of Health   Financial Resource Strain:   . Difficulty of Paying Living Expenses: Not on file  Food Insecurity:   . Worried About  Running Out of Food in the Last Year: Not on file  . Ran Out of Food in the Last Year: Not on file  Transportation Needs:   . Lack of Transportation (Medical): Not on file  . Lack of Transportation (Non-Medical): Not on file  Physical Activity:   . Days of Exercise per Week: Not on file  . Minutes of Exercise per Session: Not on file  Stress:   . Feeling of Stress : Not on file  Social Connections:   . Frequency of Communication with Friends and Family: Not on file  . Frequency of Social Gatherings with Friends and Family: Not on file  . Attends Religious Services: Not on file  . Active Member of Clubs or Organizations: Not on file  . Attends  Banker Meetings: Not on file  . Marital Status: Not on file  Intimate Partner Violence:   . Fear of Current or Ex-Partner: Not on file  . Emotionally Abused: Not on file  . Physically Abused: Not on file  . Sexually Abused: Not on file     Current Outpatient Medications:  .  AMBULATORY NON FORMULARY MEDICATION, Medication Name: CBD GUMMIES, Disp: , Rfl:  .  amLODipine-benazepril (LOTREL) 10-40 MG capsule, Take 1 capsule by mouth daily., Disp: 90 capsule, Rfl: 1 .  celecoxib (CELEBREX) 200 MG capsule, TAKE 1 CAPSULE(200 MG) BY MOUTH TWICE DAILY, Disp: 60 capsule, Rfl: 5 .  chlorthalidone (HYGROTON) 25 MG tablet, Take 1 tablet (25 mg total) by mouth daily., Disp: 90 tablet, Rfl: 1 .  medroxyPROGESTERone (DEPO-PROVERA) 150 MG/ML injection, Inject 150 mg into the muscle every 3 (three) months., Disp: , Rfl:  .  metoprolol succinate (TOPROL-XL) 100 MG 24 hr tablet, TAKE 1 TABLET BY MOUTH DAILY( TAKE WITH OR IMMEDIATELY FOLLOWING A MEAL), Disp: 90 tablet, Rfl: 1 .  valACYclovir (VALTREX) 1000 MG tablet, TAKE 1 TABLET(1000 MG) BY MOUTH DAILY, Disp: 20 tablet, Rfl: 6  EXAM:  VITALS per patient if applicable: BP 133/90   Pulse 77   Temp 98.2 F (36.8 C)   Wt 215 lb (97.5 kg)   BMI 33.67 kg/m   GENERAL: alert, oriented, appears well and in no acute distress  HEENT: atraumatic, conjunttiva clear, no obvious abnormalities on inspection of external nose and ears  NECK: normal movements of the head and neck  LUNGS: on inspection no signs of respiratory distress, breathing rate appears normal, no obvious gross SOB, gasping or wheezing  CV: no obvious cyanosis  MS: moves all visible extremities without noticeable abnormality  PSYCH/NEURO: pleasant and cooperative, no obvious depression or anxiety, speech and thought processing grossly intact  ASSESSMENT AND PLAN:  Discussed the following assessment and plan:  Acute maxillary sinusitis Symptoms consistent with sinusitis.   Will cover with augmentin.  She did have COVID test this morning as well but somewhat atypical of COVID without fever or other systemic symptoms.   She will continue mucinex and encouraged increased fluid intake.  She may use delsym if needed for cough.  Instructed to let us know if COVID test returns positive or she has worsening of symptoms.      I discussed the assessment and treatment plan with the patient. The patient was provided an opportunity to ask questions and all were answered. The patient agreed with the plan and demonstrated an understanding of the instructions.   The patient was advised to call back or seek an in-person evaluation if the symptoms worsen or if the condition fails to  improve as anticipated.    Everrett Coombe, DO

## 2020-09-04 ENCOUNTER — Other Ambulatory Visit: Payer: Self-pay

## 2020-09-04 MED ORDER — FLUCONAZOLE 150 MG PO TABS: 150.0000 mg | ORAL_TABLET | Freq: Once | ORAL | 1 refills | Status: AC

## 2020-09-04 NOTE — Telephone Encounter (Signed)
Requesting fluconazole be sent in for yeast infection after taking antibiotic, rx pended  Wanted to let you know that COVID test was done Thursday last week and was negative

## 2020-09-11 ENCOUNTER — Telehealth: Payer: Self-pay | Admitting: *Deleted

## 2020-09-11 NOTE — Telephone Encounter (Signed)
Left patient a message to call and schedule annual with Dr. Earlene Plater on or after 11/06/2020, but before school goes back for the new year.

## 2020-09-12 ENCOUNTER — Telehealth: Payer: Self-pay

## 2020-09-12 DIAGNOSIS — Z1211 Encounter for screening for malignant neoplasm of colon: Secondary | ICD-10-CM

## 2020-09-12 NOTE — Telephone Encounter (Signed)
Connie Mills called about her results and wanting iron supplement. I called and left a message for her to call back.   Agapito Games, MD  09/11/2020 5:07 PM EDT     Hi Pryor Montes,  Your hemoglobin is still a little low. You are mildly anemic with a hemoglobin of 10.5. It was much lower about 8 years ago. I would recommend that we consider going ahead and doing a colonoscopy. They have actually adjusted the screening age to 31 instead of 50 just to rule out any causes of blood loss from the colon. Please see if she would be okay with that. Your metabolic panel including liver and kidney function looks great. LDL cholesterol is mildly elevated. Just continue work on [employer] and regular exercise. Your hemoglobin A1c is in the prediabetes range so I just want you to work on cutting back on carbohydrates and sugars and then we will plan to recheck this again in about 4 months.  Please call lab and see if we can add an iron and ferritin level.

## 2020-09-13 LAB — CBC
HCT: 33.6 % — ABNORMAL LOW (ref 35.0–45.0)
Hemoglobin: 10.5 g/dL — ABNORMAL LOW (ref 11.7–15.5)
MCH: 23.6 pg — ABNORMAL LOW (ref 27.0–33.0)
MCHC: 31.3 g/dL — ABNORMAL LOW (ref 32.0–36.0)
MCV: 75.7 fL — ABNORMAL LOW (ref 80.0–100.0)
MPV: 10.7 fL (ref 7.5–12.5)
Platelets: 346 10*3/uL (ref 140–400)
RBC: 4.44 10*6/uL (ref 3.80–5.10)
RDW: 15 % (ref 11.0–15.0)
WBC: 7.7 10*3/uL (ref 3.8–10.8)

## 2020-09-13 LAB — LIPID PANEL
Cholesterol: 186 mg/dL (ref <200)
HDL: 41 mg/dL — ABNORMAL LOW (ref >=50)
LDL Cholesterol (Calc): 126 mg/dL (calc) — ABNORMAL HIGH
Non-HDL Cholesterol (Calc): 145 mg/dL (calc) — ABNORMAL HIGH (ref <130)
Total CHOL/HDL Ratio: 4.5 (calc) (ref <5.0)
Triglycerides: 93 mg/dL (ref <150)

## 2020-09-13 LAB — COMPLETE METABOLIC PANEL WITH GFR
AG Ratio: 1.3 (calc) (ref 1.0–2.5)
ALT: 13 U/L (ref 6–29)
AST: 14 U/L (ref 10–35)
Albumin: 4.2 g/dL (ref 3.6–5.1)
Alkaline phosphatase (APISO): 56 U/L (ref 31–125)
BUN: 13 mg/dL (ref 7–25)
CO2: 26 mmol/L (ref 20–32)
Calcium: 9.6 mg/dL (ref 8.6–10.2)
Chloride: 106 mmol/L (ref 98–110)
Creat: 0.8 mg/dL (ref 0.50–1.10)
GFR, Est African American: 102 mL/min/{1.73_m2} (ref >=60)
GFR, Est Non African American: 88 mL/min/{1.73_m2} (ref >=60)
Globulin: 3.3 g/dL (calc) (ref 1.9–3.7)
Glucose, Bld: 76 mg/dL (ref 65–99)
Potassium: 3.9 mmol/L (ref 3.5–5.3)
Sodium: 140 mmol/L (ref 135–146)
Total Bilirubin: 0.4 mg/dL (ref 0.2–1.2)
Total Protein: 7.5 g/dL (ref 6.1–8.1)

## 2020-09-13 LAB — HEMOGLOBIN A1C
Hgb A1c MFr Bld: 5.8 % of total Hgb — ABNORMAL HIGH (ref <5.7)
Mean Plasma Glucose: 120 (calc)
eAG (mmol/L): 6.6 (calc)

## 2020-09-13 LAB — FERRITIN: Ferritin: 153 ng/mL (ref 16–232)

## 2020-09-13 NOTE — Telephone Encounter (Signed)
Connie Mills agreed to the referral to GI for colonoscopy. Referral placed.

## 2020-09-15 ENCOUNTER — Encounter: Payer: Self-pay | Admitting: Family Medicine

## 2020-09-15 DIAGNOSIS — R7301 Impaired fasting glucose: Secondary | ICD-10-CM | POA: Insufficient documentation

## 2020-09-26 ENCOUNTER — Other Ambulatory Visit: Payer: Self-pay

## 2020-09-26 ENCOUNTER — Ambulatory Visit (AMBULATORY_SURGERY_CENTER): Payer: Self-pay

## 2020-09-26 VITALS — Ht 67.0 in | Wt 221.0 lb

## 2020-09-26 DIAGNOSIS — Z1211 Encounter for screening for malignant neoplasm of colon: Secondary | ICD-10-CM

## 2020-09-26 DIAGNOSIS — D509 Iron deficiency anemia, unspecified: Secondary | ICD-10-CM

## 2020-09-26 MED ORDER — CLENPIQ 10-3.5-12 MG-GM -GM/160ML PO SOLN: 1.0000 | Freq: Once | ORAL | 0 refills | Status: AC

## 2020-09-26 NOTE — Progress Notes (Signed)
No allergies to soy or egg Pt is not on blood thinners or diet pills Denies issues with sedation/intubation Denies atrial flutter/fib Denies constipation   Emmi instructions given to pt  Pt is aware of Covid safety and care partner requirements.  

## 2020-09-27 ENCOUNTER — Encounter: Payer: Self-pay | Admitting: Gastroenterology

## 2020-10-04 ENCOUNTER — Telehealth: Payer: Self-pay

## 2020-10-04 DIAGNOSIS — D649 Anemia, unspecified: Secondary | ICD-10-CM

## 2020-10-04 NOTE — Telephone Encounter (Signed)
Okay, we could certainly also check her B12 and folate levels to see if that could be contributing to the anemia as well.  Make sure staying well-hydrated.  Keep an eye on the blood pressure make sure it is not going too high or too low.

## 2020-10-04 NOTE — Telephone Encounter (Signed)
Connie Mills states she does have a colonoscopy scheduled. In the meantime she is still having fatigue, feeling cold all the time and lightheadedness. She wanted to know if there was anything she can take. Please advise.

## 2020-10-07 NOTE — Telephone Encounter (Signed)
Patient advised and would like to move forward with labs. Labs ordered.

## 2020-10-09 ENCOUNTER — Other Ambulatory Visit: Payer: Self-pay | Admitting: *Deleted

## 2020-10-09 DIAGNOSIS — E611 Iron deficiency: Secondary | ICD-10-CM

## 2020-10-09 DIAGNOSIS — D649 Anemia, unspecified: Secondary | ICD-10-CM

## 2020-10-09 LAB — B12 AND FOLATE PANEL
Folate: 10.5 ng/mL
Vitamin B-12: 301 pg/mL (ref 200–1100)

## 2020-10-18 ENCOUNTER — Encounter: Payer: Self-pay | Admitting: Gastroenterology

## 2020-10-18 ENCOUNTER — Ambulatory Visit (AMBULATORY_SURGERY_CENTER): Payer: BC Managed Care – PPO | Admitting: Gastroenterology

## 2020-10-18 ENCOUNTER — Encounter: Payer: BC Managed Care – PPO | Admitting: Gastroenterology

## 2020-10-18 ENCOUNTER — Other Ambulatory Visit: Payer: Self-pay

## 2020-10-18 VITALS — BP 157/88 | HR 55 | Temp 97.8°F | Resp 15 | Ht 67.0 in | Wt 221.0 lb

## 2020-10-18 DIAGNOSIS — D509 Iron deficiency anemia, unspecified: Secondary | ICD-10-CM

## 2020-10-18 DIAGNOSIS — Z1211 Encounter for screening for malignant neoplasm of colon: Secondary | ICD-10-CM

## 2020-10-18 MED ORDER — SODIUM CHLORIDE 0.9 % IV SOLN: 500.0000 mL | Freq: Once | INTRAVENOUS | Status: DC

## 2020-10-18 NOTE — Progress Notes (Signed)
A and O x3. Report to RN. Tolerated MAC anesthesia well.

## 2020-10-18 NOTE — Op Note (Signed)
[facility] Patient Name: Connie Mills Procedure Date: 10/18/2020 2:32 PM MRN: [id number] Endoscopist: Lynann Bologna , MD Age: 44 Referring MD:  Date of Birth: [birth year] Gender: Female Account #: [id number] Procedure:                Colonoscopy Indications:              Screening for colorectal malignant neoplasm Medicines:                Monitored Anesthesia Care Procedure:                Pre-Anesthesia Assessment:                           - Prior to the procedure, a History and Physical                            was performed, and patient medications and                            allergies were reviewed. The patient's tolerance of                            previous anesthesia was also reviewed. The risks                            and benefits of the procedure and the sedation                            options and risks were discussed with the patient.                            All questions were answered, and informed consent                            was obtained. Prior Anticoagulants: The patient has                            taken no previous anticoagulant or antiplatelet                            agents. ASA Grade Assessment: II - A patient with                            mild systemic disease. After reviewing the risks                            and benefits, the patient was deemed in                            satisfactory condition to undergo the procedure.                           After obtaining informed consent, the colonoscope  was passed under direct vision. Throughout the                            procedure, the patient's blood pressure, pulse, and                            oxygen saturations were monitored continuously. The                            Colonoscope was introduced through the anus and                            advanced to the 2 cm into the ileum. The                            colonoscopy was performed  without difficulty. The                            patient tolerated the procedure well. The quality                            of the bowel preparation was good. The terminal                            ileum, ileocecal valve, appendiceal orifice, and                            rectum were photographed. Scope In: 2:37:27 PM Scope Out: 2:46:26 PM Scope Withdrawal Time: 0 hours 4 minutes 34 seconds  Total Procedure Duration: 0 hours 8 minutes 59 seconds  Findings:                 The colon (entire examined portion) appeared normal.                           The terminal ileum appeared normal.                           The exam was otherwise without abnormality on                            direct and retroflexion views. Complications:            No immediate complications. Estimated Blood Loss:     Estimated blood loss: none. Impression:               - The entire examined colon is normal.                           - The examined portion of the ileum was normal.                           - The examination was otherwise normal on direct  and retroflexion views.                           - No specimens collected. Recommendation:           - Patient has a contact number available for                            emergencies. The signs and symptoms of potential                            delayed complications were discussed with the                            patient. Return to normal activities tomorrow.                            Written discharge instructions were provided to the                            patient.                           - Resume previous diet.                           - Continue present medications.                           - Repeat colonoscopy in 10 years for screening                            purposes. Earlier, if with any new problems or                            change in family history.                           - Return to GI clinic  PRN.                           - The findings and recommendations were discussed                            with the patient's family. Lynann Bologna, MD 10/18/2020 2:49:52 PM This report has been signed electronically.

## 2020-10-18 NOTE — Progress Notes (Signed)
Pt's states no medical or surgical changes since previsit or office visit. 

## 2020-10-18 NOTE — Patient Instructions (Signed)
Discharge instructions given. Normal exam. Resume previous medications. YOU HAD AN ENDOSCOPIC PROCEDURE TODAY AT THE [facility]:   Refer to the procedure report that was given to you for any specific questions about what was found during the examination.  If the procedure report does not answer your questions, please call your gastroenterologist to clarify.  If you requested that your care partner not be given the details of your procedure findings, then the procedure report has been included in a sealed envelope for you to review at your convenience later.  YOU SHOULD EXPECT: Some feelings of bloating in the abdomen. Passage of more gas than usual.  Walking can help get rid of the air that was put into your GI tract during the procedure and reduce the bloating. If you had a lower endoscopy (such as a colonoscopy or flexible sigmoidoscopy) you may notice spotting of blood in your stool or on the toilet paper. If you underwent a bowel prep for your procedure, you may not have a normal bowel movement for a few days.  Please Note:  You might notice some irritation and congestion in your nose or some drainage.  This is from the oxygen used during your procedure.  There is no need for concern and it should clear up in a day or so.  SYMPTOMS TO REPORT IMMEDIATELY:  Following lower endoscopy (colonoscopy or flexible sigmoidoscopy):  Excessive amounts of blood in the stool  Significant tenderness or worsening of abdominal pains  Swelling of the abdomen that is new, acute  Fever of 100F or higher   For urgent or emergent issues, a gastroenterologist can be reached at any hour by calling [phone]. Do not use MyChart messaging for urgent concerns.    DIET:  We do recommend a small meal at first, but then you may proceed to your regular diet.  Drink plenty of fluids but you should avoid alcoholic beverages for 24 hours.  ACTIVITY:  You should plan to take it easy for the rest of  today and you should NOT DRIVE or use heavy machinery until tomorrow (because of the sedation medicines used during the test).    FOLLOW UP: Our staff will call the number listed on your records 48-72 hours following your procedure to check on you and address any questions or concerns that you may have regarding the information given to you following your procedure. If we do not reach you, we will leave a message.  We will attempt to reach you two times.  During this call, we will ask if you have developed any symptoms of COVID 19. If you develop any symptoms (ie: fever, flu-like symptoms, shortness of breath, cough etc.) before then, please call [phone].  If you test positive for Covid 19 in the 2 weeks post procedure, please call and report this information to us.    If any biopsies were taken you will be contacted by phone or by letter within the next 1-3 weeks.  Please call us at [phone] if you have not heard about the biopsies in 3 weeks.    SIGNATURES/CONFIDENTIALITY: You and/or your care partner have signed paperwork which will be entered into your electronic medical record.  These signatures attest to the fact that that the information above on your After Visit Summary has been reviewed and is understood.  Full responsibility of the confidentiality of this discharge information lies with you and/or your care-partner.  

## 2020-10-22 ENCOUNTER — Telehealth: Payer: Self-pay

## 2020-10-22 NOTE — Telephone Encounter (Signed)
  Follow up Call-  Call back number 10/18/2020  Post procedure Call Back phone  # [phone]  Permission to leave phone message Yes  Some recent data might be hidden     Patient questions:  Do you have a fever, pain , or abdominal swelling? No. Pain Score  0 *  Have you tolerated food without any problems? Yes.    Have you been able to return to your normal activities? Yes.    Do you have any questions about your discharge instructions: Diet   No. Medications  No. Follow up visit  No.  Do you have questions or concerns about your Care? No.  Actions: * If pain score is 4 or above: No action needed, pain <4. 1. Have you developed a fever since your procedure? no  2.   Have you had an respiratory symptoms (SOB or cough) since your procedure? no  3.   Have you tested positive for COVID 19 since your procedure no  4.   Have you had any family members/close contacts diagnosed with the COVID 19 since your procedure?  no   If yes to any of these questions please route to Laverna Peace, RN and Karlton Lemon, RN

## 2020-10-28 ENCOUNTER — Telehealth: Payer: Self-pay

## 2020-10-28 MED ORDER — MEDROXYPROGESTERONE ACETATE 150 MG/ML IM SUSP
150.0000 mg | INTRAMUSCULAR | 0 refills | Status: DC
Start: 2020-10-28 — End: 2020-10-29

## 2020-10-28 NOTE — Telephone Encounter (Signed)
Pt called needing refill of Depo before appt tomorrow. Refill sent. Pt has annual scheduled for 11/07/20.

## 2020-10-29 ENCOUNTER — Other Ambulatory Visit: Payer: Self-pay

## 2020-10-29 ENCOUNTER — Ambulatory Visit (INDEPENDENT_AMBULATORY_CARE_PROVIDER_SITE_OTHER): Payer: BC Managed Care – PPO | Admitting: *Deleted

## 2020-10-29 DIAGNOSIS — Z3042 Encounter for surveillance of injectable contraceptive: Secondary | ICD-10-CM

## 2020-10-29 MED ORDER — MEDROXYPROGESTERONE ACETATE 150 MG/ML IM SUSP
150.0000 mg | Freq: Once | INTRAMUSCULAR | Status: AC
  Administered 2020-10-29: 150 mg via INTRAMUSCULAR

## 2020-10-29 NOTE — Progress Notes (Signed)
Pt here for Depo Provera injection only.  Pt does provide her med.  She is due for annual exam and will schedule before she leaves today.

## 2020-11-04 ENCOUNTER — Other Ambulatory Visit: Payer: Self-pay | Admitting: Family Medicine

## 2020-11-07 ENCOUNTER — Ambulatory Visit (INDEPENDENT_AMBULATORY_CARE_PROVIDER_SITE_OTHER): Payer: BC Managed Care – PPO | Admitting: Obstetrics and Gynecology

## 2020-11-07 ENCOUNTER — Encounter: Payer: Self-pay | Admitting: Obstetrics and Gynecology

## 2020-11-07 ENCOUNTER — Other Ambulatory Visit: Payer: Self-pay

## 2020-11-07 ENCOUNTER — Other Ambulatory Visit (HOSPITAL_COMMUNITY)
Admission: RE | Admit: 2020-11-07 | Discharge: 2020-11-07 | Disposition: A | Discharge status: Home / Self Care | Payer: BC Managed Care – PPO | Source: Ambulatory Visit | Attending: Obstetrics and Gynecology | Admitting: Obstetrics and Gynecology

## 2020-11-07 VITALS — BP 147/81 | HR 68 | Ht 67.0 in | Wt 221.0 lb

## 2020-11-07 DIAGNOSIS — Z124 Encounter for screening for malignant neoplasm of cervix: Secondary | ICD-10-CM | POA: Diagnosis not present

## 2020-11-07 DIAGNOSIS — Z01419 Encounter for gynecological examination (general) (routine) without abnormal findings: Secondary | ICD-10-CM

## 2020-11-07 DIAGNOSIS — Z1231 Encounter for screening mammogram for malignant neoplasm of breast: Secondary | ICD-10-CM | POA: Diagnosis not present

## 2020-11-07 MED ORDER — MEDROXYPROGESTERONE ACETATE 150 MG/ML IM SUSP: 150.0000 mg | INTRAMUSCULAR | 3 refills | Status: DC

## 2020-11-07 NOTE — Progress Notes (Signed)
GYNECOLOGY ANNUAL PREVENTATIVE CARE ENCOUNTER NOTE  Subjective:   Connie Mills is a 44 y.o. G65P2012 female here for a annual gynecologic exam. Current complaints: needs refill on depo. Has been on depo about 8 years, no periods with it. Happy with it.     Denies abnormal vaginal bleeding, discharge, pelvic pain, problems with intercourse or other gynecologic concerns. Declines STI screen.   Gynecologic History No LMP recorded. Patient has had an injection. Contraception: Depo-Provera injections Last Pap: 2019. Results: normal Last mammogram: last year. Results: Birads 1 DEXA: has never had  Obstetric History OB History  Gravida Para Term Preterm AB Living  3 2 2   1 2   SAB IAB Ectopic Multiple Live Births  1 0 0 0 2    # Outcome Date GA Lbr Len/2nd Weight Sex Delivery Anes PTL Lv  3 Term 04/29/12 [redacted]w[redacted]d 05:06 / 02:25 5 lb 10 oz (2.551 kg) F VBAC, Vacuum EPI  LIV     Birth Comments: caput  2 SAB 07/28/11          1 Term 05/16/92 [redacted]w[redacted]d  7 lb (3.175 kg) M CS-Unspec EPI N LIV     Birth Comments: breech    Past Medical History:  Diagnosis Date  . Abnormal Pap smear of cervix 2005   Leep; normal pap after  . AMA (advanced maternal age) multigravida 35+   . Anemia    having chronic diarrhea since Sept 2021  . Blood transfusion May 16, 1992   "busted blood vessel behind incision", led to bleeding.  . Blood transfusion complicating pregnancy 1993  . Hypertension    labetolol 200 bid  . Irregular heart beat    Has been evaluated and is monitored.    Past Surgical History:  Procedure Laterality Date  . CERVICAL BIOPSY  W/ LOOP ELECTRODE EXCISION    . CESAREAN SECTION  1993    Breech, blood loss transfusion with "busted blood vessels behind incision"  . WISDOM TOOTH EXTRACTION  2003    Current Outpatient Medications on File Prior to Visit  Medication Sig Dispense Refill  . amLODipine-benazepril (LOTREL) 10-40 MG capsule Take 1 capsule by mouth daily. 90 capsule 1  .  celecoxib (CELEBREX) 200 MG capsule TAKE 1 CAPSULE(200 MG) BY MOUTH TWICE DAILY 60 capsule 5  . chlorthalidone (HYGROTON) 25 MG tablet Take 1 tablet (25 mg total) by mouth daily. 90 tablet 1  . metoprolol succinate (TOPROL-XL) 100 MG 24 hr tablet TAKE 1 TABLET BY MOUTH DAILY( TAKE WITH OR IMMEDIATELY FOLLOWING A MEAL) 90 tablet 1  . valACYclovir (VALTREX) 1000 MG tablet TAKE 1 TABLET(1000 MG) BY MOUTH DAILY 20 tablet 6  . AMBULATORY NON FORMULARY MEDICATION Medication Name: CBD GUMMIES    . CLENPIQ 10-3.5-12 MG-GM -GM/160ML SOLN Take by mouth.     No current facility-administered medications on file prior to visit.    Allergies  Allergen Reactions  . Dilaudid [Hydromorphone Hcl] Itching  . Hctz [Hydrochlorothiazide] Other (See Comments)    Doesn't feel well on it    Social History   Socioeconomic History  . Marital status: Married    Spouse name: Not on file  . Number of children: Not on file  . Years of education: Not on file  . Highest education level: Not on file  Occupational History  . Occupation: 05-15-1972: [employer]  Tobacco Use  . Smoking status: Never Smoker  . Smokeless tobacco: Never Used  Vaping Use  .  Vaping Use: Never used  Substance and Sexual Activity  . Alcohol use: No  . Drug use: No  . Sexual activity: Yes    Partners: Male    Birth control/protection: Injection    Comment: Depo  Other Topics Concern  . Not on file  Social History Narrative  . Not on file   Social Determinants of Health   Financial Resource Strain: Not on file  Food Insecurity: Not on file  Transportation Needs: Not on file  Physical Activity: Not on file  Stress: Not on file  Social Connections: Not on file  Intimate Partner Violence: Not on file    Family History  Problem Relation Age of Onset  . Hypertension Mother   . Hypertension Brother   . Diabetes Maternal Grandmother   . Cancer Paternal Grandfather   . Hypertension Sister   . Thyroid disease  Sister   . Thyroid disease Maternal Aunt   . Colon cancer Maternal Grandfather 60  . Other Neg Hx   . Colon polyps Neg Hx   . Esophageal cancer Neg Hx   . Rectal cancer Neg Hx   . Stomach cancer Neg Hx     The following portions of the patient's history were reviewed and updated as appropriate: allergies, current medications, past family history, past medical history, past social history, past surgical history and problem list.  Review of Systems Pertinent items are noted in HPI.   Objective:  BP (!) 147/81   Pulse 68   Ht 5\' 7"  (1.702 m)   Wt 221 lb (100.2 kg)   BMI 34.61 kg/m  CONSTITUTIONAL: Well-developed, well-nourished female in no acute distress.  HENT:  Normocephalic, atraumatic, External right and left ear normal. Oropharynx is clear and moist EYES: Conjunctivae and EOM are normal. Pupils are equal, round, and reactive to light. No scleral icterus.  NECK: Normal range of motion, supple, no masses.  Normal thyroid.  SKIN: Skin is warm and dry. No rash noted. Not diaphoretic. No erythema. No pallor. NEUROLOGIC: Alert and oriented to person, place, and time. Normal reflexes, muscle tone coordination. No cranial nerve deficit noted. PSYCHIATRIC: Normal mood and affect. Normal behavior. Normal judgment and thought content. CARDIOVASCULAR: Normal heart rate noted RESPIRATORY: Effort normal, no problems with respiration noted. BREASTS: Symmetric in size. No masses, skin changes, nipple drainage, or lymphadenopathy. ABDOMEN: Soft, no distention noted.  No tenderness, rebound or guarding.  PELVIC: Normal appearing external genitalia; normal appearing vaginal mucosa and cervix.  No abnormal discharge noted.  Pap smear obtained. Normal uterine size, no other palpable masses, no uterine or adnexal tenderness. MUSCULOSKELETAL: Normal range of motion. No tenderness.  No cyanosis, clubbing, or edema.   Exam done with chaperone present.   Assessment and Plan:   1. Well woman  exam Healthy female exam - reviewed risks of long term depo use, beenfits of continuing to have no periods (she had very heavy and painful cycles), need to discontinue at some point, rec to take Vit D and calcium, she verbalizes understanding and desires to continue - Cytology - PAP( [facility])  2. Encounter for screening mammogram for malignant neoplasm of breast - MM Digital Screening; Future  3. Cervical cancer screening Pap today   Will follow up results of pap smear and manage accordingly. Encouraged improvement in diet and exercise.  COVID vaccine UTD Declines STI screen. Mammogram ordered Referral for colonoscopy UTD Flu vaccine UTD DEXA not due based on age  Routine preventative health maintenance measures emphasized. Please refer  to After Visit Summary for other counseling recommendations.     Baldemar Lenis, M.D. Attending [facility])

## 2020-11-07 NOTE — Progress Notes (Signed)
Last pap- 12/06/17- negative Last mam- 04/12/2019- normal

## 2020-11-11 LAB — CYTOLOGY - PAP
Comment: NEGATIVE
Diagnosis: NEGATIVE
High risk HPV: NEGATIVE

## 2020-11-22 ENCOUNTER — Encounter: Payer: Self-pay | Admitting: Nurse Practitioner

## 2020-11-22 ENCOUNTER — Telehealth (INDEPENDENT_AMBULATORY_CARE_PROVIDER_SITE_OTHER): Payer: BC Managed Care – PPO | Admitting: Nurse Practitioner

## 2020-11-22 VITALS — HR 96 | Temp 98.8°F

## 2020-11-22 DIAGNOSIS — J329 Chronic sinusitis, unspecified: Secondary | ICD-10-CM | POA: Diagnosis not present

## 2020-11-22 DIAGNOSIS — J40 Bronchitis, not specified as acute or chronic: Secondary | ICD-10-CM

## 2020-11-22 DIAGNOSIS — Z20822 Contact with and (suspected) exposure to covid-19: Secondary | ICD-10-CM

## 2020-11-22 MED ORDER — PSEUDOEPHEDRINE-GUAIFENESIN ER 120-1200 MG PO TB12: 1.0000 | ORAL_TABLET | Freq: Two times a day (BID) | ORAL | 0 refills | Status: DC

## 2020-11-22 MED ORDER — AMOXICILLIN-POT CLAVULANATE 875-125 MG PO TABS: 1.0000 | ORAL_TABLET | Freq: Two times a day (BID) | ORAL | 0 refills | Status: DC

## 2020-11-22 MED ORDER — FLUCONAZOLE 150 MG PO TABS: 150.0000 mg | ORAL_TABLET | Freq: Once | ORAL | 0 refills | Status: AC

## 2020-11-22 MED ORDER — HYDROCOD POLST-CPM POLST ER 10-8 MG/5ML PO SUER
5.0000 mL | Freq: Two times a day (BID) | ORAL | 0 refills | Status: DC | PRN
Start: 2020-11-22 — End: 2021-01-21

## 2020-11-22 NOTE — Progress Notes (Signed)
Virtual Visit via Telephone Note  I connected with  Connie Mills on 11/22/20 at  8:50 AM EST by telephone and verified that I am speaking with the correct person using two identifiers.   I discussed the limitations, risks, security and privacy concerns of performing an evaluation and management service by telephone and the availability of in person appointments. I also discussed with the patient that there may be a patient responsible charge related to this service. The patient expressed understanding and agreed to proceed.  Participating parties included in this telephone visit include: The patient and the nurse practitioner listed.  The patient is: At home I am: In the office  Subjective:    CC: URI Symptoms  HPI: Connie Mills is a 45 y.o. year old female presenting today via telephone visit to discuss symptoms of sinus pain and pressure, head pressure, cough, congestion, fever, rhinorrhea, and sore throat that started about 2 days ago (11/20/2020). She has been taking tylenol, nyquil/dayquil, and OTC cough syrup with minimal benefit to her symptoms. Her cough is keeping her up at night. 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   She denies shortness of breath or chest tightness, but does report that her breathing is not normal. She reports that her symptoms feel like a severe sinus infection, but not like her typical sinus infection.   She denies no nausea, vomiting, or diarrhea. No loss of taste or smell. No palpitations or chest pain.   Past medical history, Surgical history, Family history not pertinant except as noted below, Social history, Allergies, and medications have been entered into the medical record, reviewed, and corrections made.   Review of Systems:  All review of systems negative except what is listed in the HPI  Objective:    General:  Patient speaking clearly in complete sentences. No shortness of breath noted.   Alert and oriented  x3.   Normal judgment.  No apparent acute distress. She is audibly congested with a cough.   Impression and Recommendations:   1. Encounter by telehealth for suspected COVID-19 - chlorpheniramine-HYDROcodone (TUSSIONEX) 10-8 MG/5ML SUER; Take 5 mLs by mouth every 12 (twelve) hours as needed for cough (cough, will cause drowsiness.).  Dispense: 120 mL; Refill: 0 - Pseudoephedrine-Guaifenesin (MUCINEX D MAX STRENGTH) 5414450024 MG TB12; Take 1 tablet by mouth 2 (two) times daily.  Dispense: 20 tablet; Refill: 0  2. Sinobronchitis - amoxicillin-clavulanate (AUGMENTIN) 875-125 MG tablet; Take 1 tablet by mouth 2 (two) times daily.  Dispense: 10 tablet; Refill: 0 - fluconazole (DIFLUCAN) 150 MG tablet; Take 1 tablet (150 mg total) by mouth once for 1 dose.  Dispense: 1 tablet; Refill: 0 - Pseudoephedrine-Guaifenesin (MUCINEX D MAX STRENGTH) [id number] MG TB12; Take 1 tablet by mouth 2 (two) times daily.  Dispense: 20 tablet; Refill: 0  Symptoms and presentation consistent with COVID-19 infection, although, unable to rule out possible sinusitis at this time, as well. Patient directed for COVID-19 testing at location near her home for today or tomorrow.  Discussed quarantine recommendations until test results received and recommendations for positive results.  Discussed OTC medications that may be helpful for symptom management and warning symptoms that would warrant immediate evaluation.  Will go ahead and send prescription for augmentin to pharmacy- patient instructed not to pick up prescription unless COVID test is negative and her symptoms persist over the weekend. Diflucan provided for vaginal yeast infection that often presents with antibiotic use.  Prescription for Mucinex D and Tussionex also  provided for cough and congestion symptoms.  All questions answered.  Work note provided.    I discussed the assessment and treatment plan with the patient. The patient was provided an opportunity to ask  questions and all were answered. The patient agreed with the plan and demonstrated an understanding of the instructions.   The patient was advised to call back or seek an in-person evaluation if the symptoms worsen or if the condition fails to improve as anticipated.  I provided 20 minutes of non-face-to-face time during this TELEPHONE encounter.    Tollie Eth, NP

## 2020-11-22 NOTE — Patient Instructions (Signed)
Over the counter medications that may be helpful for symptoms: . Guaifenesin 1200 mg extended release tabs twice daily, with plenty of water o For cough and congestion o Brand name: Mucinex  - Or Mucinex D (with sudafed) . Pseudoephedrine 30 mg, one or two tabs every 4 to 6 hours o For sinus congestion o Brand name: Sudafed o You must get this from the pharmacy counter.  . Oxymetazoline nasal spray each morning, one spray in each nostril, for NO MORE THAN 3 days  o For nasal and sinus congestion o Brand name: Afrin . Saline nasal spray or Saline Nasal Irrigation 3-5 times a day o For nasal and sinus congestion o Brand names: Ocean or AYR . Fluticasone nasal spray, one spray in each nostril, each morning after oxymetazoline and saline, if used o For nasal and sinus congestion o Brand name: Flonase . Warm salt water gargles  o For sore throat o Every few hours as needed . Alternate ibuprofen 400-600 mg and acetaminophen 1000 mg every 4-6 hours o For fever, body aches, headache o Brand names: Motrin or Advil and Tylenol . Dextromethorphan 12-hour cough version 30 mg every 12 hours  o For cough o Brand name: Delsym Stop all other cold medications for now (Nyquil, Dayquil, Tylenol Cold, Theraflu, etc) and other non-prescription cough/cold preparations. Many of these have the same ingredients listed above and could cause an overdose of medication.   General Instructions . Allow your body to rest . Drink PLENTY of fluids . Isolate yourself from everyone, even family, until test results have returned  If your COVID-19 test is positive . Then you ARE INFECTED and you can pass the virus to others . You must quarantine from others for a minimum of  o 10 days since symptoms started AND o You are fever free for 24 hours WITHOUT any medication to reduce fever AND o Your symptoms are improving . Do not go to the store or other public areas . Do not go around household members who are not  known to be infected with COVID-19 . If you MUST leave you area of quarantine (example: go to a bathroom you share with others in your home), you must o Wear a mask o Wash your hands thoroughly o Wipe down any surfaces you touch . Do not share food, drinks, towels, or other items with other persons . Dispose of your own tissues, food containers, etc  Once you have recovered, please continue good preventive care measures, including:  . wearing a mask when in public . wash your hands frequently . avoid touching your face/nose/eyes . cover coughs/sneezes with the inside of your elbow . stay out of crowds . keep a 6 foot distance from others  Go to the nearest hospital emergency room if fever/cough/breathlessness are severe or illness seems like a threat to life.

## 2020-11-23 ENCOUNTER — Other Ambulatory Visit: Payer: BC Managed Care – PPO

## 2020-11-26 ENCOUNTER — Encounter: Payer: Self-pay | Admitting: Nurse Practitioner

## 2021-01-21 ENCOUNTER — Ambulatory Visit (INDEPENDENT_AMBULATORY_CARE_PROVIDER_SITE_OTHER): Payer: BC Managed Care – PPO | Admitting: *Deleted

## 2021-01-21 ENCOUNTER — Other Ambulatory Visit: Payer: Self-pay | Admitting: Obstetrics & Gynecology

## 2021-01-21 ENCOUNTER — Other Ambulatory Visit: Payer: Self-pay

## 2021-01-21 DIAGNOSIS — Z3042 Encounter for surveillance of injectable contraceptive: Secondary | ICD-10-CM | POA: Diagnosis not present

## 2021-01-21 DIAGNOSIS — Z1231 Encounter for screening mammogram for malignant neoplasm of breast: Secondary | ICD-10-CM

## 2021-01-21 MED ORDER — MEDROXYPROGESTERONE ACETATE 150 MG/ML IM SUSP
150.0000 mg | Freq: Once | INTRAMUSCULAR | Status: AC
  Administered 2021-01-21: 150 mg via INTRAMUSCULAR

## 2021-01-21 NOTE — Progress Notes (Signed)
Pt here for Depo Provera 150 mg given Left Deltoid.  Lot [id number] 7/24  NDC 72902-111-55

## 2021-02-04 ENCOUNTER — Ambulatory Visit (INDEPENDENT_AMBULATORY_CARE_PROVIDER_SITE_OTHER): Payer: BC Managed Care – PPO | Admitting: Family Medicine

## 2021-02-04 ENCOUNTER — Encounter: Payer: Self-pay | Admitting: Family Medicine

## 2021-02-04 ENCOUNTER — Other Ambulatory Visit: Payer: Self-pay

## 2021-02-04 VITALS — BP 138/82 | HR 76 | Ht 67.0 in | Wt 225.0 lb

## 2021-02-04 DIAGNOSIS — R2232 Localized swelling, mass and lump, left upper limb: Secondary | ICD-10-CM | POA: Diagnosis not present

## 2021-02-04 DIAGNOSIS — R0602 Shortness of breath: Secondary | ICD-10-CM

## 2021-02-04 DIAGNOSIS — I10 Essential (primary) hypertension: Secondary | ICD-10-CM | POA: Diagnosis not present

## 2021-02-04 DIAGNOSIS — R7301 Impaired fasting glucose: Secondary | ICD-10-CM

## 2021-02-04 DIAGNOSIS — G245 Blepharospasm: Secondary | ICD-10-CM

## 2021-02-04 LAB — POCT GLYCOSYLATED HEMOGLOBIN (HGB A1C): Hemoglobin A1C: 5.5 % (ref 4.0–5.6)

## 2021-02-04 NOTE — Assessment & Plan Note (Signed)
Well controlled. Continue current regimen. Follow up in  6 mo  

## 2021-02-04 NOTE — Progress Notes (Signed)
Established Patient Office Visit  Subjective:  Patient ID: Connie Mills, female    DOB: [birth year]  Age: 45 y.o. MRN: [id number]  CC:  Chief Complaint  Patient presents with  . Hypertension  . Eye Twitching    R eye twitch  . Mass    Under L axilla  since January. Not painful  . Shortness of Breath    Pt reports that since getting COVID vaccine she has had some SOB      HPI Connie Mills presents for   Hypertension- Pt denies chest pain, SOB, dizziness, or heart palpitations.  Taking meds as directed w/o problems.  Denies medication side effects.    She complains of a lump in her left axilla she says is not painful or bothersome.  Is been there for probably about 2 months she noticed it in January.  She has had the COVID vaccine but says it was in April of last year so almost a year ago.  She also complains of some spasms in her eyelid.  She says her sister encouraged her to drink more fluid and she does notice that when she hydrates better it does not seem to happen its been coming and going for about a month.  Reports some increase shortness of breath on and off since the summer after having her Covid shot in April.  She denies any chest pain with the shortness of breath and says it usually with exertion.  She never experiences it at rest.  She denies any wheezing though she does have a childhood history of asthma.  No cough.  No fevers or chills.   Past Medical History:  Diagnosis Date  . Abnormal Pap smear of cervix 2005   Leep; normal pap after  . AMA (advanced maternal age) multigravida 35+   . Anemia    having chronic diarrhea since Sept 2021  . Blood transfusion May 16, 1992   "busted blood vessel behind incision", led to bleeding.  . Blood transfusion complicating pregnancy 1993  . Hypertension    labetolol 200 bid  . Irregular heart beat    Has been evaluated and is monitored.    Past Surgical History:  Procedure Laterality Date  . CERVICAL BIOPSY  W/  LOOP ELECTRODE EXCISION    . CESAREAN SECTION  1993    Breech, blood loss transfusion with "busted blood vessels behind incision"  . WISDOM TOOTH EXTRACTION  2003    Family History  Problem Relation Age of Onset  . Hypertension Mother   . Hypertension Brother   . Diabetes Maternal Grandmother   . Cancer Paternal Grandfather   . Hypertension Sister   . Thyroid disease Sister   . Thyroid disease Maternal Aunt   . Colon cancer Maternal Grandfather 60  . Other Neg Hx   . Colon polyps Neg Hx   . Esophageal cancer Neg Hx   . Rectal cancer Neg Hx   . Stomach cancer Neg Hx     Social History   Socioeconomic History  . Marital status: Married    Spouse name: Not on file  . Number of children: Not on file  . Years of education: Not on file  . Highest education level: Not on file  Occupational History  . Occupation: Curator: [employer]  Tobacco Use  . Smoking status: Never Smoker  . Smokeless tobacco: Never Used  Vaping Use  . Vaping Use: Never used  Substance and Sexual Activity  .  Alcohol use: No  . Drug use: No  . Sexual activity: Yes    Partners: Male    Birth control/protection: Injection    Comment: Depo  Other Topics Concern  . Not on file  Social History Narrative  . Not on file   Social Determinants of Health   Financial Resource Strain: Not on file  Food Insecurity: Not on file  Transportation Needs: Not on file  Physical Activity: Not on file  Stress: Not on file  Social Connections: Not on file  Intimate Partner Violence: Not on file    Outpatient Medications Prior to Visit  Medication Sig Dispense Refill  . AMBULATORY NON FORMULARY MEDICATION Medication Name: CBD GUMMIES    . amLODipine-benazepril (LOTREL) 10-40 MG capsule Take 1 capsule by mouth daily. 90 capsule 1  . celecoxib (CELEBREX) 200 MG capsule TAKE 1 CAPSULE(200 MG) BY MOUTH TWICE DAILY 60 capsule 5  . chlorthalidone (HYGROTON) 25 MG tablet Take 1 tablet (25 mg total) by  mouth daily. 90 tablet 1  . medroxyPROGESTERone (DEPO-PROVERA) 150 MG/ML injection Inject 1 mL (150 mg total) into the muscle every 3 (three) months. 1 mL 3  . metoprolol succinate (TOPROL-XL) 100 MG 24 hr tablet TAKE 1 TABLET BY MOUTH DAILY( TAKE WITH OR IMMEDIATELY FOLLOWING A MEAL) 90 tablet 1  . Multiple Vitamin tablet Take 1 tablet by mouth daily.    . valACYclovir (VALTREX) 1000 MG tablet TAKE 1 TABLET(1000 MG) BY MOUTH DAILY 20 tablet 6  . Pseudoephedrine-Guaifenesin (MUCINEX D MAX STRENGTH) [phone] MG TB12 Take 1 tablet by mouth 2 (two) times daily. 20 tablet 0   No facility-administered medications prior to visit.    Allergies  Allergen Reactions  . Dilaudid [Hydromorphone Hcl] Itching  . Hctz [Hydrochlorothiazide] Other (See Comments)    Doesn't feel well on it    ROS Review of Systems    Objective:    Physical Exam Constitutional:      Appearance: She is well-developed.  HENT:     Head: Normocephalic and atraumatic.  Cardiovascular:     Rate and Rhythm: Normal rate and regular rhythm.     Heart sounds: Normal heart sounds.  Pulmonary:     Effort: Pulmonary effort is normal.     Breath sounds: Normal breath sounds.  Skin:    General: Skin is warm and dry.     Comments: In the left axilla she has 2 palpable lesions each measuring about a centimeter.  Does not feel like a cyst it feels more like a lymph node 1 is right along the axillary crease and wanted [place] about 2 cm below the crease.  Nontender.  No erythema.  No rash.  Neurological:     Mental Status: She is alert and oriented to person, place, and time.  Psychiatric:        Behavior: Behavior normal.     BP 138/82   Pulse 76   Ht 5\' 7"  (1.702 m)   Wt 225 lb (102.1 kg)   SpO2 98%   BMI 35.24 kg/m  Wt Readings from Last 3 Encounters:  02/04/21 225 lb (102.1 kg)  11/07/20 221 lb (100.2 kg)  10/18/20 221 lb (100.2 kg)     Health Maintenance Due  Topic Date Due  . COVID-19 Vaccine (3 - Booster for  Pfizer series) 09/13/2020    There are no preventive care reminders to display for this patient.  Lab Results  Component Value Date   TSH 1.52 04/12/2019   Lab  Results  Component Value Date   WBC 7.7 09/09/2020   HGB 10.5 (L) 09/09/2020   HCT 33.6 (L) 09/09/2020   MCV 75.7 (L) 09/09/2020   PLT 346 09/09/2020   Lab Results  Component Value Date   NA 140 09/09/2020   K 3.9 09/09/2020   CO2 26 09/09/2020   GLUCOSE 76 09/09/2020   BUN 13 09/09/2020   CREATININE 0.80 09/09/2020   BILITOT 0.4 09/09/2020   ALKPHOS 40 03/20/2016   AST 14 09/09/2020   ALT 13 09/09/2020   PROT 7.5 09/09/2020   ALBUMIN 4.2 03/20/2016   CALCIUM 9.6 09/09/2020   Lab Results  Component Value Date   CHOL 186 09/09/2020   Lab Results  Component Value Date   HDL 41 (L) 09/09/2020   Lab Results  Component Value Date   LDLCALC 126 (H) 09/09/2020   Lab Results  Component Value Date   TRIG 93 09/09/2020   Lab Results  Component Value Date   CHOLHDL 4.5 09/09/2020   Lab Results  Component Value Date   HGBA1C 5.5 02/04/2021      Assessment & Plan:   Problem List Items Addressed This Visit      Cardiovascular and Mediastinum   Hypertension, essential, benign - Primary    Well controlled. Continue current regimen. Follow up in  39mo       Relevant Orders   BASIC METABOLIC PANEL WITH GFR   Fe+TIBC+Fer   CBC     Endocrine   IFG (impaired fasting glucose)    Well controlled.  In fact her A1c is back to normal which is fantastic.  Plan to recheck again in 6 months and at that point if still less than 5.7 we can go back to yearly testing.      Relevant Orders   POCT glycosylated hemoglobin (Hb A1C) (Completed)   BASIC METABOLIC PANEL WITH GFR   Fe+TIBC+Fer   CBC    Other Visit Diagnoses    SOB (shortness of breath)       Relevant Orders   DG Chest 2 View   BASIC METABOLIC PANEL WITH GFR   Fe+TIBC+Fer   CBC   Mass of left axilla       Relevant Orders   MM Digital  Diagnostic Bilat   US BREAST COMPLETE UNI LEFT INC AXILLA   BASIC METABOLIC PANEL WITH GFR   Fe+TIBC+Fer   CBC   Blepharospasm         Shortness of breath-unclear etiology.  Has been persistent at least since the summer she notices it again with exertion.  We did discuss the possibility of recurring asthma or deconditioning.  We will start with a chest x-ray to rule in and and rule out some things.  Last CBC in October showed a hemoglobin of 10.5.  This was not unusual for her.  So she does have anemia but again this is not unusual for her.  We will plan to recheck a CBC she will go to the lab on Friday.  Like to schedule her for spirometry as well.  Left axillary mass -she is already scheduled for a screening mammogram in a couple of weeks.  We will can switch this to diagnostic so that they can do an ultrasound of the left axilla at the same time.  If the lesions go away then please let me know.  Blepharospasm-discussed benign nature of this make sure staying hydrated avoiding staring at screens for long periods of time make sure  to try to rest the eyes when able.  If it continues then please let me know.  No orders of the defined types were placed in this encounter.   Follow-up: Return in about 6 months (around 08/07/2021) for Hypertension.    Nani Gasser, MD

## 2021-02-04 NOTE — Assessment & Plan Note (Signed)
Well controlled.  In fact her A1c is back to normal which is fantastic.  Plan to recheck again in 6 months and at that point if still less than 5.7 we can go back to yearly testing.

## 2021-02-07 ENCOUNTER — Other Ambulatory Visit: Payer: Self-pay

## 2021-02-07 ENCOUNTER — Ambulatory Visit (INDEPENDENT_AMBULATORY_CARE_PROVIDER_SITE_OTHER): Payer: BC Managed Care – PPO

## 2021-02-07 DIAGNOSIS — R0602 Shortness of breath: Secondary | ICD-10-CM | POA: Diagnosis not present

## 2021-02-26 ENCOUNTER — Ambulatory Visit: Payer: BC Managed Care – PPO

## 2021-03-27 ENCOUNTER — Other Ambulatory Visit: Payer: Self-pay

## 2021-03-27 ENCOUNTER — Ambulatory Visit
Admission: RE | Admit: 2021-03-27 | Discharge: 2021-03-27 | Disposition: A | Discharge status: Home / Self Care | Payer: BC Managed Care – PPO | Source: Ambulatory Visit | Attending: Family Medicine | Admitting: Family Medicine

## 2021-03-27 DIAGNOSIS — R2232 Localized swelling, mass and lump, left upper limb: Secondary | ICD-10-CM

## 2021-04-24 ENCOUNTER — Other Ambulatory Visit: Payer: Self-pay | Admitting: Family Medicine

## 2021-04-24 DIAGNOSIS — I10 Essential (primary) hypertension: Secondary | ICD-10-CM

## 2021-04-28 ENCOUNTER — Other Ambulatory Visit: Payer: Self-pay

## 2021-04-28 ENCOUNTER — Ambulatory Visit (INDEPENDENT_AMBULATORY_CARE_PROVIDER_SITE_OTHER): Payer: BC Managed Care – PPO

## 2021-04-28 DIAGNOSIS — Z3042 Encounter for surveillance of injectable contraceptive: Secondary | ICD-10-CM | POA: Diagnosis not present

## 2021-04-28 DIAGNOSIS — Z789 Other specified health status: Secondary | ICD-10-CM

## 2021-04-28 MED ORDER — MEDROXYPROGESTERONE ACETATE 150 MG/ML IM SUSP
150.0000 mg | Freq: Once | INTRAMUSCULAR | Status: AC
Start: 2021-04-28 — End: 2021-04-28
  Administered 2021-04-28: 150 mg via INTRAMUSCULAR

## 2021-04-28 NOTE — Progress Notes (Signed)
Pt here for Depo Provera injection. Pt supplied meds. Injection given in left deltoid and tolerated well.   NDC: 01093-235-57 Lot :[id number] Exp: 05/17/2023

## 2021-04-29 ENCOUNTER — Telehealth: Payer: Self-pay | Admitting: *Deleted

## 2021-04-29 NOTE — Telephone Encounter (Signed)
Pt called and stated that she would like to have her care done with [facility].   She stated that she was told that she would need to see either a podatrist or orthopedist. She would like to get an opinion from Dr. Linford Arnold. Pt advised that she is not here this week until Friday and that I can get her in with Dr. Benjamin Stain.   After speaking with Dr. Benjamin Stain he recommended to have pt wait until the swelling goes down and have her come in next week for boot.   Appt scheduled.

## 2021-05-05 ENCOUNTER — Ambulatory Visit (INDEPENDENT_AMBULATORY_CARE_PROVIDER_SITE_OTHER): Payer: BC Managed Care – PPO | Admitting: Sports Medicine

## 2021-05-05 ENCOUNTER — Ambulatory Visit (INDEPENDENT_AMBULATORY_CARE_PROVIDER_SITE_OTHER): Payer: BC Managed Care – PPO

## 2021-05-05 DIAGNOSIS — G8929 Other chronic pain: Secondary | ICD-10-CM | POA: Insufficient documentation

## 2021-05-05 DIAGNOSIS — S92101A Unspecified fracture of right talus, initial encounter for closed fracture: Secondary | ICD-10-CM | POA: Diagnosis not present

## 2021-05-05 DIAGNOSIS — M19071 Primary osteoarthritis, right ankle and foot: Secondary | ICD-10-CM | POA: Insufficient documentation

## 2021-05-05 MED ORDER — TRAMADOL HCL 50 MG PO TABS: 50.0000 mg | ORAL_TABLET | Freq: Three times a day (TID) | ORAL | 0 refills | Status: DC | PRN

## 2021-05-05 NOTE — Progress Notes (Signed)
    Procedures performed today:    None.  Independent interpretation of notes and tests performed by another provider:   None.  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 She was seen in urgent care, x-rays showed a potential avulsion fracture from the talus. She was placed in a stirrup brace and referred to me. On exam she has severe tenderness over the dorsal talus, she also has some tenderness at the distal fibula and at the ankle mortise. We will transition her into a cam boot, add tramadol for nocturnal pain, Celebrex seems to be acceptable for the day. Adding ankle rehab exercises, updated x-rays, return to see me in 4 weeks. I have also added a temporary handicap placard.    ___________________________________________ Ihor Austin. Benjamin Stain, M.D., ABFM., CAQSM. Primary Care and Sports Medicine [place] [facility]  Adjunct Instructor of Family Medicine  [employer]

## 2021-05-05 NOTE — Assessment & Plan Note (Signed)
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 She was seen in urgent care, x-rays showed a potential avulsion fracture from the talus. She was placed in a stirrup brace and referred to me. On exam she has severe tenderness over the dorsal talus, she also has some tenderness at the distal fibula and at the ankle mortise. We will transition her into a cam boot, add tramadol for nocturnal pain, Celebrex seems to be acceptable for the day. Adding ankle rehab exercises, updated x-rays, return to see me in 4 weeks. I have also added a temporary handicap placard.

## 2021-06-03 ENCOUNTER — Other Ambulatory Visit: Payer: Self-pay

## 2021-06-03 ENCOUNTER — Ambulatory Visit: Payer: BC Managed Care – PPO | Admitting: Sports Medicine

## 2021-06-03 DIAGNOSIS — S92101A Unspecified fracture of right talus, initial encounter for closed fracture: Secondary | ICD-10-CM | POA: Diagnosis not present

## 2021-06-03 MED ORDER — TRAMADOL HCL 50 MG PO TABS: 25.0000 mg | ORAL_TABLET | Freq: Three times a day (TID) | ORAL | 0 refills | Status: DC | PRN

## 2021-06-03 NOTE — Assessment & Plan Note (Signed)
This is a pleasant 45 year old female, she is approximately 5 weeks post avulsion fracture of her dorsal talus. Doing okay in a boot still with some discomfort, she still has some tenderness over the fracture, I would like her to stay in the boot for another 3 weeks, at which point I would like to see her back and we can consider transition into a steroid Aircast which she already has at home. Refilling tramadol, she has a temporary handicap placard.

## 2021-06-03 NOTE — Progress Notes (Signed)
    Procedures performed today:    None.  Independent interpretation of notes and tests performed by another provider:   I personally reviewed the x-rays with the patient today and showed her the avulsion from the dorsal talus.  Brief History, Exam, Impression, and Recommendations:    Fracture of talus of right ankle, closed This is a pleasant 45 year old female, she is approximately 5 weeks post avulsion fracture of her dorsal talus. Doing okay in a boot still with some discomfort, she still has some tenderness over the fracture, I would like her to stay in the boot for another 3 weeks, at which point I would like to see her back and we can consider transition into a steroid Aircast which she already has at home. Refilling tramadol, she has a temporary handicap placard.    ___________________________________________ Ihor Austin. Benjamin Stain, M.D., ABFM., CAQSM. Primary Care and Sports Medicine  [facility]  Adjunct Instructor of Family Medicine  [employer]

## 2021-06-24 ENCOUNTER — Other Ambulatory Visit: Payer: Self-pay

## 2021-06-24 ENCOUNTER — Ambulatory Visit: Payer: BC Managed Care – PPO | Admitting: Sports Medicine

## 2021-06-24 DIAGNOSIS — S92101D Unspecified fracture of right talus, subsequent encounter for fracture with routine healing: Secondary | ICD-10-CM

## 2021-06-24 NOTE — Assessment & Plan Note (Signed)
This pleasant 45 year old female with approximately 8 weeks post dorsal talar avulsion, she is doing okay, only minimal tenderness over the dorsal navicular, she has been out of the boot now for a couple of weeks and a stirrup Aircast, we will transition her into a lace up ASO, she will get some supportive sandals, declined wearing athletic footwear. Continue conditioning exercises, return to see me in 4 weeks.

## 2021-06-24 NOTE — Progress Notes (Signed)
    Procedures performed today:    None.  Independent interpretation of notes and tests performed by another provider:   None.  Brief History, Exam, Impression, and Recommendations:    Fracture of talus of right ankle, closed This pleasant 45 year old female with approximately 8 weeks post dorsal talar avulsion, she is doing okay, only minimal tenderness over the dorsal navicular, she has been out of the boot now for a couple of weeks and a stirrup Aircast, we will transition her into a lace up ASO, she will get some supportive sandals, declined wearing athletic footwear. Continue conditioning exercises, return to see me in 4 weeks.    ___________________________________________ Ihor Austin. Benjamin Stain, M.D., ABFM., CAQSM. Primary Care and Sports Medicine [place] [facility]  Adjunct Instructor of Family Medicine  [employer]

## 2021-07-21 ENCOUNTER — Other Ambulatory Visit: Payer: Self-pay | Admitting: Family Medicine

## 2021-07-21 DIAGNOSIS — I10 Essential (primary) hypertension: Secondary | ICD-10-CM

## 2021-07-25 ENCOUNTER — Telehealth: Payer: Self-pay

## 2021-07-25 ENCOUNTER — Ambulatory Visit (INDEPENDENT_AMBULATORY_CARE_PROVIDER_SITE_OTHER): Payer: BC Managed Care – PPO

## 2021-07-25 ENCOUNTER — Other Ambulatory Visit: Payer: Self-pay

## 2021-07-25 ENCOUNTER — Ambulatory Visit: Payer: BC Managed Care – PPO | Admitting: Sports Medicine

## 2021-07-25 DIAGNOSIS — Z3042 Encounter for surveillance of injectable contraceptive: Secondary | ICD-10-CM | POA: Diagnosis not present

## 2021-07-25 DIAGNOSIS — S92101D Unspecified fracture of right talus, subsequent encounter for fracture with routine healing: Secondary | ICD-10-CM

## 2021-07-25 DIAGNOSIS — S92101A Unspecified fracture of right talus, initial encounter for closed fracture: Secondary | ICD-10-CM

## 2021-07-25 DIAGNOSIS — Z789 Other specified health status: Secondary | ICD-10-CM

## 2021-07-25 MED ORDER — MEDROXYPROGESTERONE ACETATE 150 MG/ML IM SUSP
150.0000 mg | Freq: Once | INTRAMUSCULAR | Status: AC
  Administered 2021-07-25: 150 mg via INTRAMUSCULAR

## 2021-07-25 NOTE — Progress Notes (Addendum)
    Procedures performed today:    None.  Independent interpretation of notes and tests performed by another provider:   None.  Brief History, Exam, Impression, and Recommendations:    Fracture of talus of right ankle, closed This is a pleasant 45 year old female, she is now 3 months post inversion injury with avulsion fracture from the dorsal talus on x-rays. Initially she did well with the boot and subsequently an ASO, she has transitioned into a ankle sleeve. She is still complaining of fairly significant pain that she localizes mostly at the medial malleolus, I am not able to reproduce it with resisted ankle motion, but only to palpation. Due to failure of conservative treatment for over 3 months we will proceed with MRI.  Update 07/26/21 refilled tramadol.    ___________________________________________ Ihor Austin. Benjamin Stain, M.D., ABFM., CAQSM. Primary Care and Sports Medicine [place] [facility]  Adjunct Instructor of Family Medicine  [employer]

## 2021-07-25 NOTE — Telephone Encounter (Signed)
Patient called back after being seen in the office today stating that she thought the dr was going to call in something for pain. Like a high dose aspirin?

## 2021-07-25 NOTE — Assessment & Plan Note (Addendum)
This is a pleasant 45 year old female, she is now 3 months post inversion injury with avulsion fracture from the dorsal talus on x-rays. Initially she did well with the boot and subsequently an ASO, she has transitioned into a ankle sleeve. She is still complaining of fairly significant pain that she localizes mostly at the medial malleolus, I am not able to reproduce it with resisted ankle motion, but only to palpation. Due to failure of conservative treatment for over 3 months we will proceed with MRI.  Update 07/26/21 refilled tramadol.

## 2021-07-25 NOTE — Progress Notes (Signed)
Pt here for Depo Provera injection. Pt supplied meds. Injection given in left deltoid and tolerated well. Pt will return in 12 weeks for next injection.  NDC: 61683-729-02 Lot: [id number] Exp: 06/17/2023

## 2021-07-26 ENCOUNTER — Ambulatory Visit (INDEPENDENT_AMBULATORY_CARE_PROVIDER_SITE_OTHER): Payer: BC Managed Care – PPO

## 2021-07-26 DIAGNOSIS — S92101D Unspecified fracture of right talus, subsequent encounter for fracture with routine healing: Secondary | ICD-10-CM | POA: Diagnosis not present

## 2021-07-26 MED ORDER — TRAMADOL HCL 50 MG PO TABS: 25.0000 mg | ORAL_TABLET | Freq: Three times a day (TID) | ORAL | 0 refills | Status: DC | PRN

## 2021-07-26 MED ORDER — NAPROXEN 500 MG PO TABS: 500.0000 mg | ORAL_TABLET | Freq: Two times a day (BID) | ORAL | 3 refills | Status: AC

## 2021-07-26 NOTE — Addendum Note (Signed)
Addended by: Monica Becton on: 07/26/2021 10:25 PM   Modules accepted: Orders

## 2021-07-26 NOTE — Telephone Encounter (Signed)
Refilled tramadol and added naproxen.

## 2021-07-28 NOTE — Telephone Encounter (Signed)
Patient aware of both meds that were called in.

## 2021-08-08 ENCOUNTER — Ambulatory Visit (INDEPENDENT_AMBULATORY_CARE_PROVIDER_SITE_OTHER): Payer: BC Managed Care – PPO | Admitting: Family Medicine

## 2021-08-08 ENCOUNTER — Ambulatory Visit: Payer: BC Managed Care – PPO | Admitting: Sports Medicine

## 2021-08-08 ENCOUNTER — Ambulatory Visit (INDEPENDENT_AMBULATORY_CARE_PROVIDER_SITE_OTHER): Payer: BC Managed Care – PPO

## 2021-08-08 ENCOUNTER — Other Ambulatory Visit: Payer: Self-pay

## 2021-08-08 ENCOUNTER — Encounter: Payer: Self-pay | Admitting: Family Medicine

## 2021-08-08 VITALS — BP 134/78 | HR 81 | Ht 67.0 in | Wt 230.0 lb

## 2021-08-08 DIAGNOSIS — M19071 Primary osteoarthritis, right ankle and foot: Secondary | ICD-10-CM

## 2021-08-08 DIAGNOSIS — R7301 Impaired fasting glucose: Secondary | ICD-10-CM

## 2021-08-08 DIAGNOSIS — E785 Hyperlipidemia, unspecified: Secondary | ICD-10-CM

## 2021-08-08 DIAGNOSIS — I10 Essential (primary) hypertension: Secondary | ICD-10-CM | POA: Diagnosis not present

## 2021-08-08 NOTE — Assessment & Plan Note (Signed)
Due to recheck A1C.     Lab Results  Component Value Date   HGBA1C 5.5 02/04/2021

## 2021-08-08 NOTE — Assessment & Plan Note (Signed)
Well controlled. Continue current regimen. Follow up in  6 mo.  Bp at home running 120s.  Dur for labs.

## 2021-08-08 NOTE — Progress Notes (Signed)
    Procedures performed today:    Procedure: Real-time Ultrasound Guided injection of the right talonavicular joint Device: Samsung [id number]  Verbal informed consent obtained.  Time-out conducted.  Noted no overlying erythema, induration, or other signs of local infection.  Skin prepped in a sterile fashion.  Local anesthesia: Topical Ethyl chloride.  With sterile technique and under real time ultrasound guidance: Noted profoundly arthritic joint with overlying synovitis, 1 cc Kenalog 40, 1 cc lidocaine, 1 cc bupivacaine injected easily Completed without difficulty  Advised to call if fevers/chills, erythema, induration, drainage, or persistent bleeding.  Images permanently stored and available for review in PACS.  Impression: Technically successful ultrasound guided injection.  Independent interpretation of notes and tests performed by another provider:   None.  Brief History, Exam, Impression, and Recommendations:    Osteoarthritis of foot, right This is a pleasant 45 year old female, we treated her historically for a navicular fracture after an injury approximately 3 to 4 months ago. Fracture healed but she continued to have pain, ultimately an MRI showed moderate talonavicular osteoarthritis and midfoot osteoarthritis throughout the intertarsal joints. Today we injected her talonavicular joint, had some increasing pain postinjection so we added some crutches, I renewed her handicap placard return to see me in a month.    ___________________________________________ Ihor Austin. Benjamin Stain, M.D., ABFM., CAQSM. Primary Care and Sports Medicine [place] [facility]  Adjunct Instructor of Family Medicine  [employer]

## 2021-08-08 NOTE — Progress Notes (Signed)
Established Patient Office Visit  Subjective:  Patient ID: Connie Mills, female    DOB: Mar 28, 1976  Age: 45 y.o. MRN: [id number]  CC:  Chief Complaint  Patient presents with   Hypertension    HPI [name] presents for   Hypertension- Pt denies chest pain, SOB, dizziness, or heart palpitations.  Taking meds as directed w/o problems.  Denies medication side effects.  Reports home blood pressures in the 120s.  Impaired fasting glucose-no increased thirst or urination. No symptoms consistent with hypoglycemia.   Biggest issue lately has been her right foot she injured it after she missed stepped while visiting her sister.  She did chip the bone a little bit in fact she just saw our sports medicine doctor and had an injection.  She says it is uncomfortable and throbbing currently.  Past Medical History:  Diagnosis Date   Abnormal Pap smear of cervix 2005   Leep; normal pap after   AMA (advanced maternal age) multigravida 35+    Anemia    having chronic diarrhea since Sept 2021   Blood transfusion May 16, 1992   "busted blood vessel behind incision", led to bleeding.   Blood transfusion complicating pregnancy 1993   Hypertension    labetolol 200 bid   Irregular heart beat    Has been evaluated and is monitored.    Past Surgical History:  Procedure Laterality Date   CERVICAL BIOPSY  W/ LOOP ELECTRODE EXCISION     CESAREAN SECTION  1993    Breech, blood loss transfusion with "busted blood vessels behind incision"   WISDOM TOOTH EXTRACTION  2003    Family History  Problem Relation Age of Onset   Hypertension Mother    Hypertension Brother    Diabetes Maternal Grandmother    Cancer Paternal Grandfather    Hypertension Sister    Thyroid disease Sister    Thyroid disease Maternal Aunt    Colon cancer Maternal Grandfather 60   Other Neg Hx    Colon polyps Neg Hx    Esophageal cancer Neg Hx    Rectal cancer Neg Hx    Stomach cancer Neg Hx     Social History    Socioeconomic History   Marital status: Married    Spouse name: Not on file   Number of children: Not on file   Years of education: Not on file   Highest education level: Not on file  Occupational History   Occupation: Curator: [employer]  Tobacco Use   Smoking status: Never   Smokeless tobacco: Never  Vaping Use   Vaping Use: Never used  Substance and Sexual Activity   Alcohol use: No   Drug use: No   Sexual activity: Yes    Partners: Male    Birth control/protection: Injection    Comment: Depo  Other Topics Concern   Not on file  Social History Narrative   Not on file   Social Determinants of Health   Financial Resource Strain: Not on file  Food Insecurity: Not on file  Transportation Needs: Not on file  Physical Activity: Not on file  Stress: Not on file  Social Connections: Not on file  Intimate Partner Violence: Not on file    Outpatient Medications Prior to Visit  Medication Sig Dispense Refill   amLODipine-benazepril (LOTREL) 10-40 MG capsule TAKE 1 CAPSULE BY MOUTH DAILY 90 capsule 0   chlorthalidone (HYGROTON) 25 MG tablet Take 1 tablet (25 mg total) by mouth  daily. 90 tablet 1   medroxyPROGESTERone (DEPO-PROVERA) 150 MG/ML injection Inject 1 mL (150 mg total) into the muscle every 3 (three) months. 1 mL 3   metoprolol succinate (TOPROL-XL) 100 MG 24 hr tablet TAKE 1 TABLET BY MOUTH DAILY WITH OR IMMEDIATELY FOLLOWING A MEAL 90 tablet 0   Multiple Vitamin tablet Take 1 tablet by mouth daily.     naproxen (NAPROSYN) 500 MG tablet Take 1 tablet (500 mg total) by mouth 2 (two) times daily with a meal. 60 tablet 3   traMADol (ULTRAM) 50 MG tablet Take 0.5-1 tablets (25-50 mg total) by mouth every 8 (eight) hours as needed for moderate pain. Maximum 6 tabs per day. 30 tablet 0   valACYclovir (VALTREX) 1000 MG tablet TAKE 1 TABLET(1000 MG) BY MOUTH DAILY 20 tablet 6   No facility-administered medications prior to visit.    Allergies  Allergen  Reactions   Dilaudid [Hydromorphone Hcl] Itching   Hctz [Hydrochlorothiazide] Other (See Comments)    Doesn't feel well on it    ROS Review of Systems    Objective:    Physical Exam Constitutional:      Appearance: Normal appearance. She is well-developed.  HENT:     Head: Normocephalic and atraumatic.  Cardiovascular:     Rate and Rhythm: Normal rate and regular rhythm.     Heart sounds: Normal heart sounds.  Pulmonary:     Effort: Pulmonary effort is normal.     Breath sounds: Normal breath sounds.  Skin:    General: Skin is warm and dry.  Neurological:     Mental Status: She is alert and oriented to person, place, and time.  Psychiatric:        Behavior: Behavior normal.    BP 134/78   Pulse 81   Ht 5\' 7"  (1.702 m)   Wt 230 lb (104.3 kg)   SpO2 100%   BMI 36.02 kg/m  Wt Readings from Last 3 Encounters:  08/08/21 230 lb (104.3 kg)  02/04/21 225 lb (102.1 kg)  11/07/20 221 lb (100.2 kg)     Health Maintenance Due  Topic Date Due   COVID-19 Vaccine (3 - Booster for Pfizer series) 08/14/2020    There are no preventive care reminders to display for this patient.  Lab Results  Component Value Date   TSH 1.52 04/12/2019   Lab Results  Component Value Date   WBC 7.7 09/09/2020   HGB 10.5 (L) 09/09/2020   HCT 33.6 (L) 09/09/2020   MCV 75.7 (L) 09/09/2020   PLT 346 09/09/2020   Lab Results  Component Value Date   NA 140 09/09/2020   K 3.9 09/09/2020   CO2 26 09/09/2020   GLUCOSE 76 09/09/2020   BUN 13 09/09/2020   CREATININE 0.80 09/09/2020   BILITOT 0.4 09/09/2020   ALKPHOS 40 03/20/2016   AST 14 09/09/2020   ALT 13 09/09/2020   PROT 7.5 09/09/2020   ALBUMIN 4.2 03/20/2016   CALCIUM 9.6 09/09/2020   Lab Results  Component Value Date   CHOL 186 09/09/2020   Lab Results  Component Value Date   HDL 41 (L) 09/09/2020   Lab Results  Component Value Date   LDLCALC 126 (H) 09/09/2020   Lab Results  Component Value Date   TRIG 93  09/09/2020   Lab Results  Component Value Date   CHOLHDL 4.5 09/09/2020   Lab Results  Component Value Date   HGBA1C 5.5 02/04/2021      Assessment &  Plan:   Problem List Items Addressed This Visit       Cardiovascular and Mediastinum   Hypertension, essential, benign - Primary    Well controlled. Continue current regimen. Follow up in  6 mo.  Bp at home running 120s.  Dur for labs.        Relevant Orders   CBC   COMPLETE METABOLIC PANEL WITH GFR   Lipid panel   Hemoglobin A1c     Endocrine   IFG (impaired fasting glucose)    Due to recheck A1C.     Lab Results  Component Value Date   HGBA1C 5.5 02/04/2021         Relevant Orders   CBC   COMPLETE METABOLIC PANEL WITH GFR   Lipid panel   Hemoglobin A1c     Other   Hyperlipidemia with target LDL less than 100   Relevant Orders   Lipid panel    We did provide an ice pack for her ankle.  No orders of the defined types were placed in this encounter.   Follow-up: No follow-ups on file.    Nani Gasser, MD

## 2021-08-08 NOTE — Assessment & Plan Note (Signed)
This is a pleasant 45 year old female, we treated her historically for a navicular fracture after an injury approximately 3 to 4 months ago. Fracture healed but she continued to have pain, ultimately an MRI showed moderate talonavicular osteoarthritis and midfoot osteoarthritis throughout the intertarsal joints. Today we injected her talonavicular joint, had some increasing pain postinjection so we added some crutches, I renewed her handicap placard return to see me in a month.

## 2021-09-08 ENCOUNTER — Ambulatory Visit: Payer: BC Managed Care – PPO | Admitting: Sports Medicine

## 2021-09-11 ENCOUNTER — Ambulatory Visit: Payer: BC Managed Care – PPO | Admitting: Sports Medicine

## 2021-09-11 DIAGNOSIS — M19071 Primary osteoarthritis, right ankle and foot: Secondary | ICD-10-CM

## 2021-09-11 NOTE — Progress Notes (Signed)
    Procedures performed today:    None.  Independent interpretation of notes and tests performed by another provider:   None.  Brief History, Exam, Impression, and Recommendations:    Osteoarthritis of foot, right This is a pleasant 45 year old female, she had persistent pain in the foot, ultimately MRI showed talonavicular osteoarthritis, as well as midfoot osteoarthritis throughout the other intertarsal joints, at the last visit we injected her talonavicular joint, after a delayed recovery she has done really well, 80% better, return to see me as needed.    ___________________________________________ Ihor Austin. Benjamin Stain, M.D., ABFM., CAQSM. Primary Care and Sports Medicine [facility]  Adjunct Instructor of Family Medicine  [employer]

## 2021-09-11 NOTE — Assessment & Plan Note (Signed)
This is a pleasant 45 year old female, she had persistent pain in the foot, ultimately MRI showed talonavicular osteoarthritis, as well as midfoot osteoarthritis throughout the other intertarsal joints, at the last visit we injected her talonavicular joint, after a delayed recovery she has done really well, 80% better, return to see me as needed.

## 2021-09-12 LAB — COMPLETE METABOLIC PANEL WITH GFR
AG Ratio: 1.4 (calc) (ref 1.0–2.5)
ALT: 14 U/L (ref 6–29)
AST: 13 U/L (ref 10–35)
Albumin: 4.2 g/dL (ref 3.6–5.1)
Alkaline phosphatase (APISO): 61 U/L (ref 31–125)
BUN: 12 mg/dL (ref 7–25)
CO2: 26 mmol/L (ref 20–32)
Calcium: 9.6 mg/dL (ref 8.6–10.2)
Chloride: 106 mmol/L (ref 98–110)
Creat: 0.93 mg/dL (ref 0.50–0.99)
Globulin: 3.1 g/dL (calc) (ref 1.9–3.7)
Glucose, Bld: 82 mg/dL (ref 65–99)
Potassium: 4.2 mmol/L (ref 3.5–5.3)
Sodium: 140 mmol/L (ref 135–146)
Total Bilirubin: 0.3 mg/dL (ref 0.2–1.2)
Total Protein: 7.3 g/dL (ref 6.1–8.1)
eGFR: 76 mL/min/{1.73_m2} (ref >=60)

## 2021-09-12 LAB — LIPID PANEL
Cholesterol: 217 mg/dL — ABNORMAL HIGH (ref <200)
HDL: 45 mg/dL — ABNORMAL LOW (ref >=50)
LDL Cholesterol (Calc): 150 mg/dL (calc) — ABNORMAL HIGH
Non-HDL Cholesterol (Calc): 172 mg/dL (calc) — ABNORMAL HIGH (ref <130)
Total CHOL/HDL Ratio: 4.8 (calc) (ref <5.0)
Triglycerides: 103 mg/dL (ref <150)

## 2021-09-12 LAB — CBC
HCT: 34 % — ABNORMAL LOW (ref 35.0–45.0)
Hemoglobin: 10.7 g/dL — ABNORMAL LOW (ref 11.7–15.5)
MCH: 24 pg — ABNORMAL LOW (ref 27.0–33.0)
MCHC: 31.5 g/dL — ABNORMAL LOW (ref 32.0–36.0)
MCV: 76.4 fL — ABNORMAL LOW (ref 80.0–100.0)
MPV: 10.3 fL (ref 7.5–12.5)
Platelets: 391 10*3/uL (ref 140–400)
RBC: 4.45 10*6/uL (ref 3.80–5.10)
RDW: 15.1 % — ABNORMAL HIGH (ref 11.0–15.0)
WBC: 6.2 10*3/uL (ref 3.8–10.8)

## 2021-09-12 LAB — HEMOGLOBIN A1C
Hgb A1c MFr Bld: 5.7 % of total Hgb — ABNORMAL HIGH (ref <5.7)
Mean Plasma Glucose: 117 mg/dL
eAG (mmol/L): 6.5 mmol/L

## 2021-09-12 NOTE — Progress Notes (Signed)
Hi Connie Mills,  Your hemoglobin is 10.7, still low but stable form last year.  Your metabolic panel is normal.  Your LDL jumped up a good bit from last year.  Just encourage you to work diet and exercise to bring those numbers back down. Your A1C is up again in the pre-diabetes again.

## 2021-10-17 ENCOUNTER — Other Ambulatory Visit: Payer: Self-pay

## 2021-10-17 ENCOUNTER — Ambulatory Visit (INDEPENDENT_AMBULATORY_CARE_PROVIDER_SITE_OTHER): Payer: BC Managed Care – PPO | Admitting: *Deleted

## 2021-10-17 DIAGNOSIS — Z3042 Encounter for surveillance of injectable contraceptive: Secondary | ICD-10-CM | POA: Diagnosis not present

## 2021-10-17 MED ORDER — MEDROXYPROGESTERONE ACETATE 150 MG/ML IM SUSP
150.0000 mg | Freq: Once | INTRAMUSCULAR | Status: AC
  Administered 2021-10-17: 150 mg via INTRAMUSCULAR

## 2021-10-17 NOTE — Progress Notes (Signed)
Depo Provera 150 mg given IM Rt deltoid.  Pt does provide her meds.

## 2021-10-21 ENCOUNTER — Other Ambulatory Visit: Payer: Self-pay | Admitting: Family Medicine

## 2021-10-21 DIAGNOSIS — I10 Essential (primary) hypertension: Secondary | ICD-10-CM

## 2021-11-12 NOTE — Progress Notes (Signed)
GYNECOLOGY ANNUAL PREVENTATIVE CARE ENCOUNTER NOTE  History:     Connie Mills is a 45 y.o. [id number] female here for a routine annual gynecologic exam.  Current complaints: she has left axillary lumps -she has had these before and had left axillary [place] in 03/2021 which was normal with her last mammogram. She reports they have not changed by her exam but they have continued to be tender at times.     She is going through a tough time emotionally recently - her upstairs bathroom flooded into her kitchen so her winter break has been quite stressful and it disrupted her entire holiday (she is a Veterinary surgeon for [employer]).   Denies abnormal vaginal bleeding, discharge, pelvic pain, problems with intercourse or other gynecologic concerns.   Gynecologic History No LMP recorded. Patient has had an injection. Contraception: Depo-Provera injections Last Pap: 10/2020. Result was normal with normal HPV Last Mammogram: 03/2021.  Result was normal  Obstetric History OB History  Gravida Para Term Preterm AB Living  3 2 2   1 2   SAB IAB Ectopic Multiple Live Births  1 0 0 0 2    # Outcome Date GA Lbr Len/2nd Weight Sex Delivery Anes PTL Lv  3 Term 04/29/12 [redacted]w[redacted]d 05:06 / 02:25 5 lb 10 oz (2.551 kg) F VBAC, Vacuum EPI  LIV     Birth Comments: caput  2 SAB 07/28/11          1 Term 05/16/92 [redacted]w[redacted]d  7 lb (3.175 kg) M CS-Unspec EPI N LIV     Birth Comments: breech    Past Medical History:  Diagnosis Date   Abnormal Pap smear of cervix 2005   Leep; normal pap after   AMA (advanced maternal age) multigravida 35+    Anemia    having chronic diarrhea since Sept 2021   Blood transfusion May 16, 1992   "busted blood vessel behind incision", led to bleeding.   Blood transfusion complicating pregnancy 1993   Hypertension    labetolol 200 bid   Irregular heart beat    Has been evaluated and is monitored.    Past Surgical History:  Procedure Laterality Date   CERVICAL BIOPSY  W/ LOOP ELECTRODE  EXCISION     CESAREAN SECTION  1993    Breech, blood loss transfusion with "busted blood vessels behind incision"   WISDOM TOOTH EXTRACTION  2003    Current Outpatient Medications on File Prior to Visit  Medication Sig Dispense Refill   amLODipine-benazepril (LOTREL) 10-40 MG capsule TAKE 1 CAPSULE BY MOUTH DAILY 90 capsule 0   chlorthalidone (HYGROTON) 25 MG tablet Take 1 tablet (25 mg total) by mouth daily. 90 tablet 1   medroxyPROGESTERone (DEPO-PROVERA) 150 MG/ML injection Inject 1 mL (150 mg total) into the muscle every 3 (three) months. 1 mL 3   metoprolol succinate (TOPROL-XL) 100 MG 24 hr tablet TAKE 1 TABLET BY MOUTH DAILY WITH OR IMMEDIATELY FOLLOWING A MEAL 90 tablet 0   Multiple Vitamin tablet Take 1 tablet by mouth daily.     naproxen (NAPROSYN) 500 MG tablet Take 1 tablet (500 mg total) by mouth 2 (two) times daily with a meal. 60 tablet 3   traMADol (ULTRAM) 50 MG tablet Take 0.5-1 tablets (25-50 mg total) by mouth every 8 (eight) hours as needed for moderate pain. Maximum 6 tabs per day. 30 tablet 0   valACYclovir (VALTREX) 1000 MG tablet TAKE 1 TABLET(1000 MG) BY MOUTH DAILY 20 tablet 6   No  current facility-administered medications on file prior to visit.    Allergies  Allergen Reactions   Dilaudid [Hydromorphone Hcl] Itching   Hctz [Hydrochlorothiazide] Other (See Comments)    Doesn't feel well on it    Social History:  reports that she has never smoked. She has never used smokeless tobacco. She reports that she does not drink alcohol and does not use drugs.  Family History  Problem Relation Age of Onset   Hypertension Mother    Hypertension Brother    Diabetes Maternal Grandmother    Cancer Paternal Grandfather    Hypertension Sister    Thyroid disease Sister    Thyroid disease Maternal Aunt    Colon cancer Maternal Grandfather 49   Other Neg Hx    Colon polyps Neg Hx    Esophageal cancer Neg Hx    Rectal cancer Neg Hx    Stomach cancer Neg Hx     The  following portions of the patient's history were reviewed and updated as appropriate: allergies, current medications, past family history, past medical history, past social history, past surgical history and problem list.  Review of Systems Pertinent items noted in HPI and remainder of comprehensive ROS otherwise negative.  Physical Exam:  BP (!) 190/101    Pulse 88    Ht 5\' 7"  (1.702 m)    Wt 231 lb 1.9 oz (104.8 kg)    BMI 36.20 kg/m  CONSTITUTIONAL: Well-developed, well-nourished female in no acute distress.  HENT:  Normocephalic, atraumatic, External right and left ear normal.  EYES: Conjunctivae and EOM are normal. Pupils are equal, round, and reactive to light. No scleral icterus.  NECK: Normal range of motion, supple, no masses.  Normal thyroid.  SKIN: Skin is warm and dry. No rash noted. Not diaphoretic. No erythema. No pallor. MUSCULOSKELETAL: Normal range of motion. No tenderness.  No cyanosis, clubbing, or edema. NEUROLOGIC: Alert and oriented to person, place, and time. Normal reflexes, muscle tone coordination.  PSYCHIATRIC: Normal mood and affect. Normal behavior. Normal judgment and thought content.  CARDIOVASCULAR: Normal heart rate noted, regular rhythm RESPIRATORY: Clear to auscultation bilaterally. Effort and breath sounds normal, no problems with respiration noted.  BREASTS: Symmetric in size. No masses, tenderness, skin changes, nipple drainage, or lymphadenopathy bilaterally. Left axillary Lns are palpable and tender but not enlarged, she has left breast tenderness in the outer breast at 3 oclock.  Performed in the presence of a chaperone. ABDOMEN: Soft, no distention noted.  No tenderness, rebound or guarding.  PELVIC: deferred.   Assessment and Plan:    1. Encounter for annual routine gynecological examination - Cervical cancer screening: Discussed guidelines. Pap with HPV wnl 10/2020 - STD Testing: declines - Birth Control: Continued Depo - Breast Health:  Encouraged self breast awareness/SBE. Teaching provided. Discussed limits of clinical breast exam for detecting breast cancer. Rx given for MXR for May/June for screening - F/U 12 months and prn  2. Left Axillary lump/tenderness - Previous evaluation was normal by 11/2020. Pt denies enlargement of axillary LN but persistently tender.  - Last imaging was 6-7 months ago.  - Will recheck imaging of axilla as well as her left breast given the tenderness. Left breast US and diagnostic mammogram orders.   Routine preventative health maintenance measures emphasized. Please refer to After Visit Summary for other counseling recommendations.   [place], MD, FACOG Obstetrician & Gynecologist, [facility], [facility]

## 2021-11-12 NOTE — Progress Notes (Signed)
Last pap- 11/07/20- negative Last MM- 03/27/21

## 2021-11-13 ENCOUNTER — Ambulatory Visit (INDEPENDENT_AMBULATORY_CARE_PROVIDER_SITE_OTHER): Payer: BC Managed Care – PPO | Admitting: Obstetrics and Gynecology

## 2021-11-13 ENCOUNTER — Other Ambulatory Visit: Payer: Self-pay | Admitting: Obstetrics and Gynecology

## 2021-11-13 ENCOUNTER — Other Ambulatory Visit: Payer: Self-pay

## 2021-11-13 ENCOUNTER — Encounter: Payer: Self-pay | Admitting: Obstetrics and Gynecology

## 2021-11-13 VITALS — BP 188/111 | HR 88 | Ht 67.0 in | Wt 231.1 lb

## 2021-11-13 DIAGNOSIS — Z1231 Encounter for screening mammogram for malignant neoplasm of breast: Secondary | ICD-10-CM | POA: Diagnosis not present

## 2021-11-13 DIAGNOSIS — N6320 Unspecified lump in the left breast, unspecified quadrant: Secondary | ICD-10-CM

## 2021-11-13 DIAGNOSIS — R2232 Localized swelling, mass and lump, left upper limb: Secondary | ICD-10-CM

## 2021-11-13 DIAGNOSIS — Z01419 Encounter for gynecological examination (general) (routine) without abnormal findings: Secondary | ICD-10-CM

## 2021-11-13 DIAGNOSIS — N644 Mastodynia: Secondary | ICD-10-CM

## 2021-11-28 ENCOUNTER — Ambulatory Visit (INDEPENDENT_AMBULATORY_CARE_PROVIDER_SITE_OTHER): Payer: BC Managed Care – PPO

## 2021-11-28 ENCOUNTER — Ambulatory Visit: Payer: BC Managed Care – PPO | Admitting: Sports Medicine

## 2021-11-28 ENCOUNTER — Other Ambulatory Visit: Payer: Self-pay

## 2021-11-28 DIAGNOSIS — M19071 Primary osteoarthritis, right ankle and foot: Secondary | ICD-10-CM | POA: Diagnosis not present

## 2021-11-28 DIAGNOSIS — S92101A Unspecified fracture of right talus, initial encounter for closed fracture: Secondary | ICD-10-CM

## 2021-11-28 MED ORDER — TRAMADOL HCL 50 MG PO TABS: 50.0000 mg | ORAL_TABLET | Freq: Three times a day (TID) | ORAL | 1 refills | Status: DC | PRN

## 2021-11-28 NOTE — Progress Notes (Signed)
° ° °  Procedures performed today:    Procedure: Real-time Ultrasound Guided injection of the right talonavicular joint Device: Samsung [id number]  Verbal informed consent obtained.  Time-out conducted.  Noted no overlying erythema, induration, or other signs of local infection.  Skin prepped in a sterile fashion.  Local anesthesia: Topical Ethyl chloride.  With sterile technique and under real time ultrasound guidance: Noted profoundly arthritic joint with overlying synovitis, 1 cc Kenalog 40, 1 cc lidocaine, 1 cc bupivacaine injected easily Completed without difficulty  Advised to call if fevers/chills, erythema, induration, drainage, or persistent bleeding.  Images permanently stored and available for review in PACS.  Impression: Technically successful ultrasound guided injection.  Independent interpretation of notes and tests performed by another provider:   None.  Brief History, Exam, Impression, and Recommendations:    Osteoarthritis of foot, right Repeat right talonavicular joint injection last done in September of last year. Refilling tramadol for use up to 3 times daily. Talonavicular fusion would be the next step should she not get a solid 3 months of relief.    ___________________________________________ Ihor Austin. Benjamin Stain, M.D., ABFM., CAQSM. Primary Care and Sports Medicine [facility]  Adjunct Instructor of Family Medicine  [employer]

## 2021-11-28 NOTE — Assessment & Plan Note (Addendum)
Repeat right talonavicular joint injection last done in September of last year. Refilling tramadol for use up to 3 times daily. Talonavicular fusion would be the next step should she not get a solid 3 months of relief.

## 2021-12-22 ENCOUNTER — Ambulatory Visit
Admission: RE | Admit: 2021-12-22 | Discharge: 2021-12-22 | Disposition: A | Discharge status: Home / Self Care | Payer: BC Managed Care – PPO | Source: Ambulatory Visit | Attending: Obstetrics and Gynecology | Admitting: Obstetrics and Gynecology

## 2021-12-22 DIAGNOSIS — R2232 Localized swelling, mass and lump, left upper limb: Secondary | ICD-10-CM

## 2021-12-22 DIAGNOSIS — N6320 Unspecified lump in the left breast, unspecified quadrant: Secondary | ICD-10-CM

## 2021-12-22 DIAGNOSIS — Z1231 Encounter for screening mammogram for malignant neoplasm of breast: Secondary | ICD-10-CM

## 2022-01-07 ENCOUNTER — Other Ambulatory Visit: Payer: Self-pay | Admitting: Obstetrics and Gynecology

## 2022-01-08 ENCOUNTER — Other Ambulatory Visit: Payer: Self-pay

## 2022-01-08 ENCOUNTER — Other Ambulatory Visit: Payer: Self-pay | Admitting: Obstetrics and Gynecology

## 2022-01-08 ENCOUNTER — Telehealth: Payer: Self-pay | Admitting: *Deleted

## 2022-01-08 DIAGNOSIS — Z3042 Encounter for surveillance of injectable contraceptive: Secondary | ICD-10-CM

## 2022-01-08 MED ORDER — MEDROXYPROGESTERONE ACETATE 150 MG/ML IM SUSP: 150.0000 mg | INTRAMUSCULAR | 0 refills | Status: DC

## 2022-01-08 NOTE — Telephone Encounter (Signed)
Returned call from 10:37 AM. Left patient an urgent message to contact pharmacy for refill request, have refill request faxed to [phone], and office will fax back for refill.

## 2022-01-08 NOTE — Progress Notes (Signed)
Depo Provera sent for injection appt tomorrow

## 2022-01-09 ENCOUNTER — Other Ambulatory Visit: Payer: Self-pay

## 2022-01-09 ENCOUNTER — Ambulatory Visit (INDEPENDENT_AMBULATORY_CARE_PROVIDER_SITE_OTHER): Payer: BC Managed Care – PPO | Admitting: *Deleted

## 2022-01-09 DIAGNOSIS — Z3042 Encounter for surveillance of injectable contraceptive: Secondary | ICD-10-CM | POA: Diagnosis not present

## 2022-01-09 MED ORDER — MEDROXYPROGESTERONE ACETATE 150 MG/ML IM SUSP
150.0000 mg | Freq: Once | INTRAMUSCULAR | Status: AC
  Administered 2022-01-09: 150 mg via INTRAMUSCULAR

## 2022-01-09 NOTE — Progress Notes (Cosign Needed)
Pt here for Depo Provera only injection.  Pt does provide her meds.  Lot number [id number]  Exp 10/24

## 2022-01-09 NOTE — Progress Notes (Signed)
Chart reviewed for nurse visit. Agree with plan of care.   Lezlie Lye, NP 01/09/2022 9:43 AM

## 2022-01-26 ENCOUNTER — Other Ambulatory Visit: Payer: Self-pay | Admitting: Family Medicine

## 2022-01-26 DIAGNOSIS — I10 Essential (primary) hypertension: Secondary | ICD-10-CM

## 2022-02-06 ENCOUNTER — Ambulatory Visit: Payer: BC Managed Care – PPO | Admitting: Family Medicine

## 2022-02-06 ENCOUNTER — Other Ambulatory Visit: Payer: Self-pay

## 2022-02-06 VITALS — BP 138/83 | HR 85 | Ht 67.0 in | Wt 234.0 lb

## 2022-02-06 DIAGNOSIS — I10 Essential (primary) hypertension: Secondary | ICD-10-CM | POA: Diagnosis not present

## 2022-02-06 DIAGNOSIS — J069 Acute upper respiratory infection, unspecified: Secondary | ICD-10-CM | POA: Diagnosis not present

## 2022-02-06 DIAGNOSIS — R7301 Impaired fasting glucose: Secondary | ICD-10-CM | POA: Diagnosis not present

## 2022-02-06 LAB — POCT GLYCOSYLATED HEMOGLOBIN (HGB A1C): Hemoglobin A1C: 5.7 % — AB (ref 4.0–5.6)

## 2022-02-06 MED ORDER — CHLORTHALIDONE 25 MG PO TABS: 25.0000 mg | ORAL_TABLET | Freq: Every day | ORAL | 3 refills | Status: DC

## 2022-02-06 MED ORDER — AMLODIPINE BESY-BENAZEPRIL HCL 10-40 MG PO CAPS: 1.0000 | ORAL_CAPSULE | Freq: Every day | ORAL | 3 refills | Status: DC

## 2022-02-06 MED ORDER — METOPROLOL SUCCINATE ER 100 MG PO TB24: ORAL_TABLET | ORAL | 3 refills | Status: DC

## 2022-02-06 NOTE — Progress Notes (Signed)
? ?Established Patient Office Visit ? ?Subjective:  ?Patient ID: Connie Mills, female    DOB: 02-10-1976  Age: 46 y.o. MRN: [id number] ? ?CC:  ?Chief Complaint  ?Patient presents with  ? Hypertension  ? ifg  ? ? ?HPI ?Connie Mills presents for  ? ?Hypertension- Pt denies chest pain, SOB, dizziness, or heart palpitations.  Taking meds as directed w/o problems.  Denies medication side effects.  Last seen in December and at that time her blood pressure was really high. ? ?Impaired fasting glucose-no increased thirst or urination. No symptoms consistent with hypoglycemia.she says she has tried to make some diet changes ? ?Lab Results  ?Component Value Date  ? HGBA1C 5.7 (A) 02/06/2022  ? ?She is also had some upper respiratory symptoms that started last Saturday she has been using some NyQuil but says it is getting better in fact she actually feels a lot better today she denies any chest pain or shortness of breath. ? ?Past Medical History:  ?Diagnosis Date  ? Abnormal Pap smear of cervix 2005  ? Leep; normal pap after  ? AMA (advanced maternal age) multigravida 35+   ? Anemia   ? having chronic diarrhea since Sept 2021  ? Blood transfusion May 16, 1992  ? "busted blood vessel behind incision", led to bleeding.  ? Blood transfusion complicating pregnancy 1993  ? Hypertension   ? labetolol 200 bid  ? Irregular heart beat   ? Has been evaluated and is monitored.  ? ? ?Past Surgical History:  ?Procedure Laterality Date  ? CERVICAL BIOPSY  W/ LOOP ELECTRODE EXCISION    ? CESAREAN SECTION  1993   ? Breech, blood loss transfusion with "busted blood vessels behind incision"  ? WISDOM TOOTH EXTRACTION  2003  ? ? ?Family History  ?Problem Relation Age of Onset  ? Hypertension Mother   ? Hypertension Brother   ? Diabetes Maternal Grandmother   ? Cancer Paternal Grandfather   ? Hypertension Sister   ? Thyroid disease Sister   ? Thyroid disease Maternal Aunt   ? Colon cancer Maternal Grandfather 60  ? Other Neg Hx   ? Colon  polyps Neg Hx   ? Esophageal cancer Neg Hx   ? Rectal cancer Neg Hx   ? Stomach cancer Neg Hx   ? ? ?Social History  ? ?Socioeconomic History  ? Marital status: Married  ?  Spouse name: Not on file  ? Number of children: Not on file  ? Years of education: Not on file  ? Highest education level: Not on file  ?Occupational History  ? Occupation: Psychologist, sport and exercise  ?  Employer: [employer]  ?Tobacco Use  ? Smoking status: Never  ? Smokeless tobacco: Never  ?Vaping Use  ? Vaping Use: Never used  ?Substance and Sexual Activity  ? Alcohol use: No  ? Drug use: No  ? Sexual activity: Yes  ?  Partners: Male  ?  Birth control/protection: Injection  ?  Comment: Depo  ?Other Topics Concern  ? Not on file  ?Social History Narrative  ? Not on file  ? ?Social Determinants of Health  ? ?Financial Resource Strain: Not on file  ?Food Insecurity: Not on file  ?Transportation Needs: Not on file  ?Physical Activity: Not on file  ?Stress: Not on file  ?Social Connections: Not on file  ?Intimate Partner Violence: Not on file  ? ? ?Outpatient Medications Prior to Visit  ?Medication Sig Dispense Refill  ? medroxyPROGESTERone (DEPO-PROVERA) 150 MG/ML injection  ADMINISTER 1 ML(150 MG) IN THE MUSCLE EVERY 3 MONTHS 1 mL 3  ? medroxyPROGESTERone (DEPO-PROVERA) 150 MG/ML injection ADMINISTER 1 ML(150 MG) IN THE MUSCLE EVERY 3 MONTHS 1 mL 3  ? Multiple Vitamin tablet Take 1 tablet by mouth daily.    ? naproxen (NAPROSYN) 500 MG tablet Take 1 tablet (500 mg total) by mouth 2 (two) times daily with a meal. 60 tablet 3  ? traMADol (ULTRAM) 50 MG tablet Take 1 tablet (50 mg total) by mouth every 8 (eight) hours as needed for moderate pain. 90 tablet 1  ? valACYclovir (VALTREX) 1000 MG tablet TAKE 1 TABLET(1000 MG) BY MOUTH DAILY 20 tablet 6  ? amLODipine-benazepril (LOTREL) 10-40 MG capsule TAKE 1 CAPSULE BY MOUTH DAILY 90 capsule 0  ? chlorthalidone (HYGROTON) 25 MG tablet Take 1 tablet (25 mg total) by mouth daily. 90 tablet 1  ? metoprolol succinate  (TOPROL-XL) 100 MG 24 hr tablet TAKE 1 TABLET BY MOUTH DAILY WITH OR IMMEDIATELY FOLLOWING A MEAL 90 tablet 0  ? ?No facility-administered medications prior to visit.  ? ? ?Allergies  ?Allergen Reactions  ? Dilaudid [Hydromorphone Hcl] Itching  ? Hctz [Hydrochlorothiazide] Other (See Comments)  ?  Doesn't feel well on it  ? ? ?ROS ?Review of Systems ? ?  ?Objective:  ?  ?Physical Exam ?Constitutional:   ?   Appearance: Normal appearance. She is well-developed.  ?HENT:  ?   Head: Normocephalic and atraumatic.  ?Cardiovascular:  ?   Rate and Rhythm: Normal rate and regular rhythm.  ?   Heart sounds: Normal heart sounds.  ?Pulmonary:  ?   Effort: Pulmonary effort is normal.  ?   Breath sounds: Normal breath sounds.  ?Skin: ?   General: Skin is warm and dry.  ?Neurological:  ?   Mental Status: She is alert and oriented to person, place, and time.  ?Psychiatric:     ?   Behavior: Behavior normal.  ? ?BP 138/83   Pulse 85   Ht 5\' 7"  (1.702 m)   Wt 234 lb (106.1 kg)   SpO2 100%   BMI 36.65 kg/m?  ?Wt Readings from Last 3 Encounters:  ?02/06/22 234 lb (106.1 kg)  ?11/13/21 231 lb 1.9 oz (104.8 kg)  ?08/08/21 230 lb (104.3 kg)  ? ? ? ?Health Maintenance Due  ?Topic Date Due  ? Hepatitis C Screening  Never done  ? COVID-19 Vaccine (3 - Booster for Pfizer series) 05/09/2020  ? ? ?There are no preventive care reminders to display for this patient. ? ?Lab Results  ?Component Value Date  ? TSH 1.52 04/12/2019  ? ?Lab Results  ?Component Value Date  ? WBC 6.2 09/11/2021  ? HGB 10.7 (L) 09/11/2021  ? HCT 34.0 (L) 09/11/2021  ? MCV 76.4 (L) 09/11/2021  ? PLT 391 09/11/2021  ? ?Lab Results  ?Component Value Date  ? NA 140 09/11/2021  ? K 4.2 09/11/2021  ? CO2 26 09/11/2021  ? GLUCOSE 82 09/11/2021  ? BUN 12 09/11/2021  ? CREATININE 0.93 09/11/2021  ? BILITOT 0.3 09/11/2021  ? ALKPHOS 40 03/20/2016  ? AST 13 09/11/2021  ? ALT 14 09/11/2021  ? PROT 7.3 09/11/2021  ? ALBUMIN 4.2 03/20/2016  ? CALCIUM 9.6 09/11/2021  ? EGFR 76  09/11/2021  ? ?Lab Results  ?Component Value Date  ? CHOL 217 (H) 09/11/2021  ? ?Lab Results  ?Component Value Date  ? HDL 45 (L) 09/11/2021  ? ?Lab Results  ?Component Value Date  ?  LDLCALC 150 (H) 09/11/2021  ? ?Lab Results  ?Component Value Date  ? TRIG 103 09/11/2021  ? ?Lab Results  ?Component Value Date  ? CHOLHDL 4.8 09/11/2021  ? ?Lab Results  ?Component Value Date  ? HGBA1C 5.7 (A) 02/06/2022  ? ? ?  ?Assessment & Plan:  ? ?Problem List Items Addressed This Visit   ? ?  ? Cardiovascular and Mediastinum  ? Hypertension, essential, benign - Primary  ?  Blood pressure is a little borderline today but much better than it was in December she is not really checking her blood pressures at home.  But continue to work on healthy diet regular exercise.  She did take some cold medicine last night which might be bumping it just slightly so we will continue to keep an eye on it and plan to regroup in 6 months.  For BMP today. ?  ?  ? Relevant Medications  ? amLODipine-benazepril (LOTREL) 10-40 MG capsule  ? chlorthalidone (HYGROTON) 25 MG tablet  ? metoprolol succinate (TOPROL-XL) 100 MG 24 hr tablet  ? Other Relevant Orders  ? BASIC METABOLIC PANEL WITH GFR  ?  ? Endocrine  ? IFG (impaired fasting glucose)  ?  A1C stable at 5.7 but she really wants to get it down just encouraged her to continue to work on healthy diet and food choices and cutting back on carbs.  So getting in some exercise now that it is getting warmer outside. ?  ?  ? Relevant Orders  ? BASIC METABOLIC PANEL WITH GFR  ? POCT glycosylated hemoglobin (Hb A1C) (Completed)  ? ?Other Visit Diagnoses   ? ? Viral upper respiratory tract infection      ? ?  ? ? ?URI - she is feeling better.  ? ?Meds ordered this encounter  ?Medications  ? amLODipine-benazepril (LOTREL) 10-40 MG capsule  ?  Sig: Take 1 capsule by mouth daily.  ?  Dispense:  90 capsule  ?  Refill:  3  ? chlorthalidone (HYGROTON) 25 MG tablet  ?  Sig: Take 1 tablet (25 mg total) by mouth daily.   ?  Dispense:  90 tablet  ?  Refill:  3  ? metoprolol succinate (TOPROL-XL) 100 MG 24 hr tablet  ?  Sig: Take with or immediately following a meal.  ?  Dispense:  90 tablet  ?  Refill:  3  ? ? ?Follow-up: Return in about

## 2022-02-06 NOTE — Assessment & Plan Note (Signed)
Blood pressure is a little borderline today but much better than it was in December she is not really checking her blood pressures at home.  But continue to work on healthy diet regular exercise.  She did take some cold medicine last night which might be bumping it just slightly so we will continue to keep an eye on it and plan to regroup in 6 months.  For BMP today. ?

## 2022-02-06 NOTE — Assessment & Plan Note (Addendum)
A1C stable at 5.7 but she really wants to get it down just encouraged her to continue to work on healthy diet and food choices and cutting back on carbs.  So getting in some exercise now that it is getting warmer outside. ?

## 2022-03-02 ENCOUNTER — Encounter: Payer: Self-pay | Admitting: Family Medicine

## 2022-03-03 NOTE — Telephone Encounter (Signed)
That is a little odd.  I would recommend that we get her in for an appointment.  Not sure if this could just be a temporary nerve issue or possible stroke it sounds like it is getting better. ?

## 2022-03-04 ENCOUNTER — Ambulatory Visit: Payer: BC Managed Care – PPO | Admitting: Family Medicine

## 2022-03-04 NOTE — Telephone Encounter (Signed)
Patient scheduled.

## 2022-03-05 ENCOUNTER — Telehealth: Payer: Self-pay | Admitting: Family Medicine

## 2022-03-05 ENCOUNTER — Ambulatory Visit (INDEPENDENT_AMBULATORY_CARE_PROVIDER_SITE_OTHER): Payer: BC Managed Care – PPO | Admitting: Family Medicine

## 2022-03-05 ENCOUNTER — Encounter: Payer: Self-pay | Admitting: Family Medicine

## 2022-03-05 VITALS — BP 142/82 | HR 81 | Ht 67.0 in | Wt 234.0 lb

## 2022-03-05 DIAGNOSIS — R29810 Facial weakness: Secondary | ICD-10-CM | POA: Diagnosis not present

## 2022-03-05 DIAGNOSIS — I10 Essential (primary) hypertension: Secondary | ICD-10-CM

## 2022-03-05 LAB — BASIC METABOLIC PANEL WITH GFR
BUN/Creatinine Ratio: 14 (calc) (ref 6–22)
BUN: 15 mg/dL (ref 7–25)
CO2: 26 mmol/L (ref 20–32)
Calcium: 9.9 mg/dL (ref 8.6–10.2)
Chloride: 106 mmol/L (ref 98–110)
Creat: 1.05 mg/dL — ABNORMAL HIGH (ref 0.50–0.99)
Glucose, Bld: 85 mg/dL (ref 65–99)
Potassium: 4.5 mmol/L (ref 3.5–5.3)
Sodium: 139 mmol/L (ref 135–146)
eGFR: 66 mL/min/{1.73_m2} (ref >=60)

## 2022-03-05 MED ORDER — NEBIVOLOL HCL 20 MG PO TABS: 20.0000 mg | ORAL_TABLET | Freq: Every day | ORAL | 1 refills | Status: DC

## 2022-03-05 NOTE — Assessment & Plan Note (Signed)
We will try changing metoprolol to Bystolic to see if we can get a little bit better blood pressure control. ?

## 2022-03-05 NOTE — Progress Notes (Signed)
? ?Established Patient Office Visit ? ?Subjective   ?Patient ID: Connie Mills, female    DOB: February 11, 1976  Age: 46 y.o. MRN: [id number] ? ?Chief Complaint  ?Patient presents with  ? Facial Swelling  ? ? ?HPI ?She is having some facial droop on the left side.  She also felt like it was a little swollen and swollen underneath her jawbone.  Her daughter had noticed that she was sticking her tongue out while she was talking and she did not even realize she was doing it.  She says the day that it started she just felt like a weird sensation on her face it was not numb or tingling but it bothered her enough that she did put some Vicks on it.  Sxs started about 1 week ago on Friday. No dental pain.  She has been under a lot of stress.  She feels like some of the swelling has been gradually going down and feels like it is getting a little better.  Says it never affected the area around her eye or her eyebrow it was just the lower half of her face.  She denies any ear pain pressure or recent upper respiratory infections.  No skin lesions. ? ?No vision or hearing changes ? ?Past Medical History:  ?Diagnosis Date  ? Abnormal Pap smear of cervix 2005  ? Leep; normal pap after  ? AMA (advanced maternal age) multigravida 35+   ? Anemia   ? having chronic diarrhea since Sept 2021  ? Blood transfusion May 16, 1992  ? "busted blood vessel behind incision", led to bleeding.  ? Blood transfusion complicating pregnancy 1993  ? Hypertension   ? labetolol 200 bid  ? Irregular heart beat   ? Has been evaluated and is monitored.  ? ?Past Surgical History:  ?Procedure Laterality Date  ? CERVICAL BIOPSY  W/ LOOP ELECTRODE EXCISION    ? CESAREAN SECTION  1993   ? Breech, blood loss transfusion with "busted blood vessels behind incision"  ? WISDOM TOOTH EXTRACTION  2003  ? ?  ? ?ROS ? ?  ?Objective:  ?  ? ?BP (!) 142/82   Pulse 81   Ht 5\' 7"  (1.702 m)   Wt 234 lb (106.1 kg)   SpO2 99%   BMI 36.65 kg/m?  ?  ? ?Physical Exam ?Vitals  reviewed.  ?Constitutional:   ?   Appearance: Normal appearance. She is well-developed.  ?HENT:  ?   Head: Normocephalic and atraumatic.  ?   Right Ear: Tympanic membrane, ear canal and external ear normal.  ?   Left Ear: Tympanic membrane, ear canal and external ear normal.  ?   Nose: Nose normal.  ?Eyes:  ?   Conjunctiva/sclera: Conjunctivae normal.  ?   Pupils: Pupils are equal, round, and reactive to light.  ?Neck:  ?   Thyroid: No thyromegaly.  ?Cardiovascular:  ?   Rate and Rhythm: Normal rate and regular rhythm.  ?   Heart sounds: Normal heart sounds.  ?Pulmonary:  ?   Effort: Pulmonary effort is normal.  ?   Breath sounds: Normal breath sounds. No wheezing.  ?Musculoskeletal:  ?   Cervical back: Neck supple.  ?   Comments: On the left side of her face she has a little bit of flattening of the nasolabial fold compared to the right and a little bit of fullness near the outer part of her lip.  She has fairly good symmetry when she smiles maybe just a little  bit decreased on the left.  Eyebrow raise and eye closed is normal and symmetric no drooping around the eye.  She has a little bit of tenderness just underneath the jawline but I do not feel any palpable mass or lesions.  ?Lymphadenopathy:  ?   Cervical: No cervical adenopathy.  ?Skin: ?   General: Skin is warm and dry.  ?Neurological:  ?   Mental Status: She is alert and oriented to person, place, and time.  ? ? ? ?No results found for any visits on 03/05/22. ? ?Last metabolic panel ?Lab Results  ?Component Value Date  ? GLUCOSE 82 09/11/2021  ? NA 140 09/11/2021  ? K 4.2 09/11/2021  ? CL 106 09/11/2021  ? CO2 26 09/11/2021  ? BUN 12 09/11/2021  ? CREATININE 0.93 09/11/2021  ? EGFR 76 09/11/2021  ? CALCIUM 9.6 09/11/2021  ? PROT 7.3 09/11/2021  ? ALBUMIN 4.2 03/20/2016  ? BILITOT 0.3 09/11/2021  ? ALKPHOS 40 03/20/2016  ? AST 13 09/11/2021  ? ALT 14 09/11/2021  ? ?  ? ?The 10-year ASCVD risk score (Arnett DK, et al., 2019) is: 6.5% ? ?  ?Assessment & Plan:   ? ?Problem List Items Addressed This Visit   ? ?  ? Cardiovascular and Mediastinum  ? Hypertension, essential, benign  ?  We will try changing metoprolol to Bystolic to see if we can get a little bit better blood pressure control. ? ?  ?  ? Relevant Medications  ? Nebivolol HCl 20 MG TABS  ? Other Relevant Orders  ? BASIC METABOLIC PANEL WITH GFR  ? ?Other Visit Diagnoses   ? ? Facial droop    -  Primary  ? Relevant Orders  ? MR Brain W Wo Contrast  ? ?  ? ?She has some element of facial droop and mild swelling she says the swelling actually is better than it was so it is difficult to say if she may have had some just local inflammation or parotiditis that could be resolving or if she may have actually had a stroke.  Typically with Bell's palsy it affects the eye and she has not had any issues with eyebrow or eye.  So I do think it would be reasonable to get a brain MRI at this point for further follow-up and evaluation she is not having any dental pain ? ? ?Return if symptoms worsen or fail to improve.  ? ? ?Nani Gasser, MD ? ?

## 2022-03-05 NOTE — Telephone Encounter (Signed)
Please call patient and let her know I did not switch her metoprolol or a newer beta-blocker called Bystolic is a little bit more powerful it still once a day and looks like it is covered on her insurance plan she can certainly finish out her metoprolol for the next several weeks if she needs to before switching that is perfectly fine but I would like to see if it does a better job in lowering her blood pressure. ?

## 2022-03-05 NOTE — Telephone Encounter (Signed)
Patient advised of message and verbalized understanding.  

## 2022-03-09 ENCOUNTER — Ambulatory Visit (INDEPENDENT_AMBULATORY_CARE_PROVIDER_SITE_OTHER): Payer: BC Managed Care – PPO

## 2022-03-09 DIAGNOSIS — R29810 Facial weakness: Secondary | ICD-10-CM | POA: Diagnosis not present

## 2022-03-09 MED ORDER — GADOBUTROL 1 MMOL/ML IV SOLN
10.0000 mL | Freq: Once | INTRAVENOUS | Status: AC | PRN
  Administered 2022-03-09: 10 mL via INTRAVENOUS

## 2022-03-10 NOTE — Progress Notes (Signed)
Hi Messina,  ?Your brain MRI honestly looks great.  No evidence of recent stroke or bleed or mass.  No abnormal enhancement.  The sinuses look good blood flow at the skull also looks good so nothing concerning.  Hopefully the swelling in your face is continued to go down and disappear.  But let me know if there is any new concerns or problems.  I do want a make sure that your blood pressure comes down.

## 2022-04-03 ENCOUNTER — Ambulatory Visit (INDEPENDENT_AMBULATORY_CARE_PROVIDER_SITE_OTHER): Payer: BC Managed Care – PPO

## 2022-04-03 DIAGNOSIS — Z3042 Encounter for surveillance of injectable contraceptive: Secondary | ICD-10-CM

## 2022-04-03 MED ORDER — MEDROXYPROGESTERONE ACETATE 150 MG/ML IM SUSP
150.0000 mg | Freq: Once | INTRAMUSCULAR | Status: AC
  Administered 2022-04-03: 150 mg via INTRAMUSCULAR

## 2022-04-03 NOTE — Progress Notes (Signed)
Pt here for Depo Provera injection. Pt did supply meds. Pt will return in 12 weeks for next injection.  NDC: 54656-812-75 Lot: [id number] Exp: 10/24

## 2022-06-19 ENCOUNTER — Ambulatory Visit: Payer: BC Managed Care – PPO

## 2022-06-26 ENCOUNTER — Ambulatory Visit (INDEPENDENT_AMBULATORY_CARE_PROVIDER_SITE_OTHER): Payer: BC Managed Care – PPO

## 2022-06-26 DIAGNOSIS — Z3042 Encounter for surveillance of injectable contraceptive: Secondary | ICD-10-CM | POA: Diagnosis not present

## 2022-06-26 MED ORDER — MEDROXYPROGESTERONE ACETATE 150 MG/ML IM SUSP
150.0000 mg | Freq: Once | INTRAMUSCULAR | Status: AC
  Administered 2022-06-26: 150 mg via INTRAMUSCULAR

## 2022-06-26 NOTE — Progress Notes (Signed)
Pt here for Depo Provera injection. Pt supplied meds. Pt will return on 09/15/22 for next injection.   NDC: 40981-191-47 Lot: [id number] Exp: 11/17/2023

## 2022-08-10 ENCOUNTER — Encounter: Payer: Self-pay | Admitting: Family Medicine

## 2022-08-10 ENCOUNTER — Ambulatory Visit: Payer: BC Managed Care – PPO | Admitting: Family Medicine

## 2022-08-10 VITALS — BP 138/89 | HR 79 | Ht 67.0 in | Wt 234.0 lb

## 2022-08-10 DIAGNOSIS — Z1159 Encounter for screening for other viral diseases: Secondary | ICD-10-CM | POA: Diagnosis not present

## 2022-08-10 DIAGNOSIS — I10 Essential (primary) hypertension: Secondary | ICD-10-CM | POA: Diagnosis not present

## 2022-08-10 DIAGNOSIS — R7301 Impaired fasting glucose: Secondary | ICD-10-CM | POA: Diagnosis not present

## 2022-08-10 DIAGNOSIS — M19071 Primary osteoarthritis, right ankle and foot: Secondary | ICD-10-CM

## 2022-08-10 LAB — POCT GLYCOSYLATED HEMOGLOBIN (HGB A1C): HbA1c POC (<> result, manual entry): 5.6 % (ref 4.0–5.6)

## 2022-08-10 MED ORDER — NEBIVOLOL HCL 20 MG PO TABS: 20.0000 mg | ORAL_TABLET | Freq: Every day | ORAL | 1 refills | Status: DC

## 2022-08-10 NOTE — Assessment & Plan Note (Addendum)
Blood pressure is a little borderline today we will recheck it again before she leaves.  Pete blood pressure was improved but still not quite at goal.  We discussed restarting the chlorthalidone and taking it regularly but she can always start with a half a tab.  When she had it takes a whole tab she has to urinate more frequently and that can be problematic with her foot right now so she is able to at least take a half I do think that would be helpful and will help with some of the swelling.  She is also been getting some intermittent swelling in her hands.  We are also going to try switching metoprolol to nebivolol to see if we get just a little bit better blood pressure control and see if that helps as well.

## 2022-08-10 NOTE — Assessment & Plan Note (Addendum)
A1c looks fantastic today at 5.6.  Continue to work on [employer] and staying as active as she is able to with her foot currently.  Fully when she gets some improvement with her foot she can become more active and continue to increase her activity levels.

## 2022-08-10 NOTE — Patient Instructions (Signed)
Try to take at least half a tab daily of the chlorthalidone. I will send over the prescription for nebivolol.  This will take the place of your metoprolol.

## 2022-08-10 NOTE — Progress Notes (Signed)
Established Patient Office Visit  Subjective   Patient ID: Connie Mills, female    DOB: 31-Dec-1975  Age: 46 y.o. MRN: [id number]  Chief Complaint  Patient presents with   Hypertension   ifg    HPI  Hypertension- Pt denies chest pain, SOB, dizziness, or heart palpitations.  Taking meds as directed w/o problems.  Denies medication side effects.    Impaired fasting glucose-no increased thirst or urination. No symptoms consistent with hypoglycemia.  She says she really has not been eating the best.  She has been going through the drive-through well but more often because of her foot problems she is not as mobile and its been really stressful.  Says she has not been doing as much meal prep etc.  She is feels a little bit down about her foot it just continues to be painful and limits her activity she is not able to exercise.  She is seeing Dr. Karie Schwalbe, sports medicine again on Friday they have even talked about surgery which she is considering.  Starting to get some pain in her opposite foot she thinks it is mostly from walking differently because of the discomfort in her right foot.  She has been trying to wear good supportive shoes.    ROS    Objective:     BP 138/89   Pulse 79   Ht 5\' 7"  (1.702 m)   Wt 234 lb (106.1 kg)   SpO2 100%   BMI 36.65 kg/m    Physical Exam Vitals and nursing note reviewed.  Constitutional:      Appearance: She is well-developed.  HENT:     Head: Normocephalic and atraumatic.  Cardiovascular:     Rate and Rhythm: Normal rate and regular rhythm.     Heart sounds: Normal heart sounds.  Pulmonary:     Effort: Pulmonary effort is normal.     Breath sounds: Normal breath sounds.  Skin:    General: Skin is warm and dry.  Neurological:     Mental Status: She is alert and oriented to person, place, and time.  Psychiatric:        Behavior: Behavior normal.      Results for orders placed or performed in visit on 08/10/22  POCT glycosylated  hemoglobin (Hb A1C)  Result Value Ref Range   Hemoglobin A1C     HbA1c POC (<> result, manual entry) 5.6 4.0 - 5.6 %   HbA1c, POC (prediabetic range)     HbA1c, POC (controlled diabetic range)        The 10-year ASCVD risk score (Arnett DK, et al., 2019) is: 6%    Assessment & Plan:   Problem List Items Addressed This Visit       Cardiovascular and Mediastinum   Hypertension, essential, benign - Primary    Blood pressure is a little borderline today we will recheck it again before she leaves.  Pete blood pressure was improved but still not quite at goal.  We discussed restarting the chlorthalidone and taking it regularly but she can always start with a half a tab.  When she had it takes a whole tab she has to urinate more frequently and that can be problematic with her foot right now so she is able to at least take a half I do think that would be helpful and will help with some of the swelling.  She is also been getting some intermittent swelling in her hands.  We are also going to try switching  metoprolol to nebivolol to see if we get just a little bit better blood pressure control and see if that helps as well.      Relevant Medications   Nebivolol HCl 20 MG TABS   Other Relevant Orders   Lipid Panel w/reflex Direct LDL   COMPLETE METABOLIC PANEL WITH GFR   CBC     Endocrine   IFG (impaired fasting glucose)    A1c looks fantastic today at 5.6.  Continue to work on [employer] and staying as active as she is able to with her foot currently.  Fully when she gets some improvement with her foot she can become more active and continue to increase her activity levels.      Relevant Orders   Lipid Panel w/reflex Direct LDL   COMPLETE METABOLIC PANEL WITH GFR   CBC   POCT glycosylated hemoglobin (Hb A1C) (Completed)     Musculoskeletal and Integument   Osteoarthritis of foot, right    Follow-up this Friday with Dr. Benjamin Stain to discuss possible surgery.  We discussed  may be at least going for the consultation for surgery to see how she feels about it and can make a decision from there if she is still somewhat hesitant.      Other Visit Diagnoses     Encounter for hepatitis C screening test for low risk patient       Relevant Orders   Hepatitis C Antibody      Due for lab work.  Return in about 3 weeks (around 08/31/2022) for recheck BP with nurse visit .    Nani Gasser, MD

## 2022-08-10 NOTE — Assessment & Plan Note (Signed)
Follow-up this Friday with Dr. Dianah Field to discuss possible surgery.  We discussed may be at least going for the consultation for surgery to see how she feels about it and can make a decision from there if she is still somewhat hesitant.

## 2022-08-11 ENCOUNTER — Telehealth: Payer: Self-pay

## 2022-08-11 NOTE — Telephone Encounter (Signed)
Patient needs PA started for nebivolol.

## 2022-08-12 ENCOUNTER — Telehealth: Payer: Self-pay

## 2022-08-12 NOTE — Telephone Encounter (Signed)
Initiated Prior authorization WGY:[id number] HCl 20MG  tablets Via: Covermymeds Case/Key:[id number]  Status: n/a as of 08/12/22 Reason:[facility] has indicated that it is too soon to refill this medication at the pharmacy for your patient Notified Pt via: Mychart,called left vm

## 2022-08-14 ENCOUNTER — Ambulatory Visit (INDEPENDENT_AMBULATORY_CARE_PROVIDER_SITE_OTHER): Payer: BC Managed Care – PPO | Admitting: Sports Medicine

## 2022-08-14 ENCOUNTER — Encounter: Payer: Self-pay | Admitting: Sports Medicine

## 2022-08-14 ENCOUNTER — Ambulatory Visit (INDEPENDENT_AMBULATORY_CARE_PROVIDER_SITE_OTHER): Payer: BC Managed Care – PPO

## 2022-08-14 DIAGNOSIS — F4321 Adjustment disorder with depressed mood: Secondary | ICD-10-CM | POA: Insufficient documentation

## 2022-08-14 DIAGNOSIS — M79671 Pain in right foot: Secondary | ICD-10-CM | POA: Diagnosis not present

## 2022-08-14 DIAGNOSIS — G8929 Other chronic pain: Secondary | ICD-10-CM

## 2022-08-14 MED ORDER — HYDROCODONE-ACETAMINOPHEN 5-325 MG PO TABS: 1.0000 | ORAL_TABLET | Freq: Three times a day (TID) | ORAL | 0 refills | Status: DC | PRN

## 2022-08-14 MED ORDER — SERTRALINE HCL 25 MG PO TABS: 25.0000 mg | ORAL_TABLET | Freq: Every day | ORAL | 2 refills | Status: DC

## 2022-08-14 NOTE — Progress Notes (Signed)
    Procedures performed today:    None.  Independent interpretation of notes and tests performed by another provider:   None.  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 Unfortunately she has developed recurrence of pain but this time further distally at the shafts of the metatarsals, second through fourth. She has severe tenderness to palpation. She does have only minimal tenderness at the talonavicular joint itself. She is tearful in the exam room, she feels as though her foot pain has affected her life. I do suspect she does have metatarsal stress injuries here. She is questing referral to an orthopedic surgeon as she is convinced that she needs an operation here. I am happy to do this referral to Dr. Lucia Gaskins for a second opinion. I did explain to her that stress injuries could be treated and cured, and that before any decisions are made regarding surgery, i.e. a joint fusion for her talonavicular arthritis that we should get an MRI to confirm the diagnosis and if there was a stress injury present treat this first. I would like to see her back in about a month.  Adjustment disorder with depressed mood She also has developed what sounds to be an adjustment disorder with depressed mood, adding low-dose Zoloft, we can continue this for a few months until symptoms are controlled on her foot and then attempt to come off of the medication.    ____________________________________________ Gwen Her. Dianah Field, M.D., ABFM., CAQSM., AME. Primary Care and Sports Medicine [facility]  Adjunct Professor of [employer]  Stage manager

## 2022-08-14 NOTE — Assessment & Plan Note (Signed)
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 Unfortunately she has developed recurrence of pain but this time further distally at the shafts of the metatarsals, second through fourth. She has severe tenderness to palpation. She does have only minimal tenderness at the talonavicular joint itself. She is tearful in the exam room, she feels as though her foot pain has affected her life. I do suspect she does have metatarsal stress injuries here. She is questing referral to an orthopedic surgeon as she is convinced that she needs an operation here. I am happy to do this referral to Dr. Lucia Gaskins for a second opinion. I did explain to her that stress injuries could be treated and cured, and that before any decisions are made regarding surgery, i.e. a joint fusion for her talonavicular arthritis that we should get an MRI to confirm the diagnosis and if there was a stress injury present treat this first. I would like to see her back in about a month.

## 2022-08-14 NOTE — Assessment & Plan Note (Signed)
She also has developed what sounds to be an adjustment disorder with depressed mood, adding low-dose Zoloft, we can continue this for a few months until symptoms are controlled on her foot and then attempt to come off of the medication.

## 2022-08-23 ENCOUNTER — Ambulatory Visit (INDEPENDENT_AMBULATORY_CARE_PROVIDER_SITE_OTHER): Payer: BC Managed Care – PPO

## 2022-08-23 DIAGNOSIS — M79671 Pain in right foot: Secondary | ICD-10-CM | POA: Diagnosis not present

## 2022-08-23 DIAGNOSIS — G8929 Other chronic pain: Secondary | ICD-10-CM | POA: Diagnosis not present

## 2022-08-31 ENCOUNTER — Ambulatory Visit (INDEPENDENT_AMBULATORY_CARE_PROVIDER_SITE_OTHER): Payer: BC Managed Care – PPO | Admitting: Family Medicine

## 2022-08-31 VITALS — BP 133/79 | HR 79 | Temp 97.3°F | Ht 67.0 in | Wt 233.0 lb

## 2022-08-31 DIAGNOSIS — I10 Essential (primary) hypertension: Secondary | ICD-10-CM

## 2022-08-31 NOTE — Progress Notes (Signed)
Agree with documentation as above.  We will hold the nebivolol for now as she seems to be doing better with her blood pressures continue to monitor but again goal is still less than 130 we could always reconsider starting the metoprolol at a lower dose such as 5 mg or 10 mg if needed.  Beatrice Lecher, MD

## 2022-08-31 NOTE — Progress Notes (Signed)
Patient is here for blood pressure check. Denies trouble sleeping, palpitations, dizziness, lightheadedness, blurry vision, chest pain, shortness of breath, headaches and/or medication problems.   Patient's blood pressure was within goal range. Patient sat for 15 minutes. Blood pressure recheck was at goal. Provider notified of current blood pressure readings and is aware patient is not taking the nebivolol and sertraline rxs. Per provider, patient is to have blood pressure readings under 130/85.    Patient informed to schedule next NV appointment in 1 month. Informed to bring in home bp machine to next visit for calibration.

## 2022-09-01 LAB — COMPLETE METABOLIC PANEL WITH GFR
AG Ratio: 1.2 (calc) (ref 1.0–2.5)
ALT: 14 U/L (ref 6–29)
AST: 14 U/L (ref 10–35)
Albumin: 4.1 g/dL (ref 3.6–5.1)
Alkaline phosphatase (APISO): 74 U/L (ref 31–125)
BUN/Creatinine Ratio: 13 (calc) (ref 6–22)
BUN: 15 mg/dL (ref 7–25)
CO2: 26 mmol/L (ref 20–32)
Calcium: 9.9 mg/dL (ref 8.6–10.2)
Chloride: 103 mmol/L (ref 98–110)
Creat: 1.2 mg/dL — ABNORMAL HIGH (ref 0.50–0.99)
Globulin: 3.4 g/dL (calc) (ref 1.9–3.7)
Glucose, Bld: 95 mg/dL (ref 65–99)
Potassium: 4.3 mmol/L (ref 3.5–5.3)
Sodium: 138 mmol/L (ref 135–146)
Total Bilirubin: 0.3 mg/dL (ref 0.2–1.2)
Total Protein: 7.5 g/dL (ref 6.1–8.1)
eGFR: 56 mL/min/{1.73_m2} — ABNORMAL LOW (ref >=60)

## 2022-09-01 LAB — CBC
HCT: 32.3 % — ABNORMAL LOW (ref 35.0–45.0)
Hemoglobin: 10.3 g/dL — ABNORMAL LOW (ref 11.7–15.5)
MCH: 23.7 pg — ABNORMAL LOW (ref 27.0–33.0)
MCHC: 31.9 g/dL — ABNORMAL LOW (ref 32.0–36.0)
MCV: 74.4 fL — ABNORMAL LOW (ref 80.0–100.0)
MPV: 10.9 fL (ref 7.5–12.5)
Platelets: 393 10*3/uL (ref 140–400)
RBC: 4.34 10*6/uL (ref 3.80–5.10)
RDW: 15.1 % — ABNORMAL HIGH (ref 11.0–15.0)
WBC: 7.9 10*3/uL (ref 3.8–10.8)

## 2022-09-01 LAB — LIPID PANEL W/REFLEX DIRECT LDL
Cholesterol: 206 mg/dL — ABNORMAL HIGH (ref <200)
HDL: 41 mg/dL — ABNORMAL LOW (ref >=50)
LDL Cholesterol (Calc): 142 mg/dL (calc) — ABNORMAL HIGH
Non-HDL Cholesterol (Calc): 165 mg/dL (calc) — ABNORMAL HIGH (ref <130)
Total CHOL/HDL Ratio: 5 (calc) — ABNORMAL HIGH (ref <5.0)
Triglycerides: 117 mg/dL (ref <150)

## 2022-09-01 LAB — HEPATITIS C ANTIBODY: Hepatitis C Ab: NONREACTIVE

## 2022-09-02 ENCOUNTER — Other Ambulatory Visit: Payer: Self-pay | Admitting: *Deleted

## 2022-09-02 DIAGNOSIS — R7989 Other specified abnormal findings of blood chemistry: Secondary | ICD-10-CM

## 2022-09-02 NOTE — Progress Notes (Signed)
Hi Connie Mills, your LDL cholesterol looks a little bit better this year not as good as a couple of years ago but it is improved.  Just continue to work on healthy diet and regular exercise.  Any function went up slightly.  Your baseline is around 0.9 and this time it was 1.2.  I would like to recheck that in 2 to 3 weeks.  Not sure if it could be a lab error or if there really is a shift or change.  Make sure hydrating well.  Liver function looks great.  Hemoglobin is down just slightly it was 10.7 a year ago and it is 10.3 now just a slight shift.  Please call lab and see if can add "hemoglobinopathy evaluation" to her labs.

## 2022-09-11 ENCOUNTER — Ambulatory Visit: Payer: BC Managed Care – PPO | Admitting: Sports Medicine

## 2022-09-11 DIAGNOSIS — M79671 Pain in right foot: Secondary | ICD-10-CM

## 2022-09-11 DIAGNOSIS — F4321 Adjustment disorder with depressed mood: Secondary | ICD-10-CM | POA: Diagnosis not present

## 2022-09-11 DIAGNOSIS — G8929 Other chronic pain: Secondary | ICD-10-CM

## 2022-09-11 NOTE — Assessment & Plan Note (Signed)
Previously I diagnosed her with an adjustment disorder with depressed mood, this was secondary to her lack of mobility with her foot pain. We did offer Zoloft, she opted not to take it, and her mood is much better now as her foot pain has improved.

## 2022-09-11 NOTE — Progress Notes (Signed)
    Procedures performed today:    None.  Independent interpretation of notes and tests performed by another provider:   None.  Brief History, Exam, Impression, and Recommendations:    Chronic foot pain, right This is a pleasant 46 year old female, she has known dorsal midfoot osteoarthritis mostly the talonavicular joint. She had a talonavicular joint injection back in January 2023 and did well. I saw her back in September, she had developed a recurrence of pain but this time distally at the fourth and fifth metatarsal shafts. Severe tenderness to palpation. She only had minimal tenderness at the talonavicular joint itself. We suspected a metatarsal stress fracture, I obtained an MRI that did confirm 1/5 metatarsal stress fracture with intense marrow edema, at that previous visit we did place her in a postop shoe and she returns today feeling significantly better. We will do at least another month in the postop shoe, followed by supportive footwear and avoidance of barefoot walking afterwards.   Adjustment disorder with depressed mood Previously I diagnosed her with an adjustment disorder with depressed mood, this was secondary to her lack of mobility with her foot pain. We did offer Zoloft, she opted not to take it, and her mood is much better now as her foot pain has improved.    ____________________________________________ Gwen Her. Dianah Field, M.D., ABFM., CAQSM., AME. Primary Care and Sports Medicine [facility]  Adjunct Professor of [place] [employer] [facility] of [employer]  Risk manager

## 2022-09-11 NOTE — Assessment & Plan Note (Signed)
This is a pleasant 46 year old female, she has known dorsal midfoot osteoarthritis mostly the talonavicular joint. She had a talonavicular joint injection back in January 2023 and did well. I saw her back in September, she had developed a recurrence of pain but this time distally at the fourth and fifth metatarsal shafts. Severe tenderness to palpation. She only had minimal tenderness at the talonavicular joint itself. We suspected a metatarsal stress fracture, I obtained an MRI that did confirm 1/5 metatarsal stress fracture with intense marrow edema, at that previous visit we did place her in a postop shoe and she returns today feeling significantly better. We will do at least another month in the postop shoe, followed by supportive footwear and avoidance of barefoot walking afterwards.

## 2022-09-15 ENCOUNTER — Ambulatory Visit (INDEPENDENT_AMBULATORY_CARE_PROVIDER_SITE_OTHER): Payer: BC Managed Care – PPO

## 2022-09-15 DIAGNOSIS — Z3042 Encounter for surveillance of injectable contraceptive: Secondary | ICD-10-CM

## 2022-09-15 MED ORDER — MEDROXYPROGESTERONE ACETATE 150 MG/ML IM SUSP
150.0000 mg | Freq: Once | INTRAMUSCULAR | Status: AC
  Administered 2022-09-15: 150 mg via INTRAMUSCULAR

## 2022-09-15 NOTE — Progress Notes (Signed)
Pt here for Depo Provera injection. Pt did supply meds. Injection given and tolerated well.   [place]: [id number] Lot: [id number] Exp: 08/2024

## 2022-09-23 ENCOUNTER — Other Ambulatory Visit: Payer: Self-pay | Admitting: Family Medicine

## 2022-10-01 ENCOUNTER — Ambulatory Visit (INDEPENDENT_AMBULATORY_CARE_PROVIDER_SITE_OTHER): Payer: BC Managed Care – PPO | Admitting: Family Medicine

## 2022-10-01 VITALS — BP 128/80 | HR 78 | Ht 67.0 in | Wt 234.0 lb

## 2022-10-01 DIAGNOSIS — I10 Essential (primary) hypertension: Secondary | ICD-10-CM | POA: Diagnosis not present

## 2022-10-01 NOTE — Progress Notes (Signed)
Subjective:    Patient ID: Connie Mills, female    DOB: [birth year], 46 y.o.   MRN: [id number]  HPI Patient present today for a blood pressure check per PCP. Denies trouble sleeping, palpitations or medication problems. She brought in readings from home over the last three days:  11/14 630p 129/87 11/15 559a 122/67 11/16 713a 127/69  We also tested her BP today using her at home machine which was 142/94. She reports she is supposed to have las drawn today in follow up.    Review of Systems     Objective:   Physical Exam        Assessment & Plan:  Per Dr. Linford Arnold, patient should continue her medications and continue to monitor at home. Lab slip was printed and patient was sent for blood draw.

## 2022-10-01 NOTE — Progress Notes (Signed)
Agree with documentation as above.   Theon Sobotka, MD  

## 2022-10-02 ENCOUNTER — Other Ambulatory Visit: Payer: Self-pay | Admitting: Family Medicine

## 2022-10-02 DIAGNOSIS — R7989 Other specified abnormal findings of blood chemistry: Secondary | ICD-10-CM

## 2022-10-02 LAB — BASIC METABOLIC PANEL WITH GFR
BUN/Creatinine Ratio: 9 (calc) (ref 6–22)
BUN: 13 mg/dL (ref 7–25)
CO2: 26 mmol/L (ref 20–32)
Calcium: 9.7 mg/dL (ref 8.6–10.2)
Chloride: 107 mmol/L (ref 98–110)
Creat: 1.41 mg/dL — ABNORMAL HIGH (ref 0.50–0.99)
Glucose, Bld: 73 mg/dL (ref 65–99)
Potassium: 4.3 mmol/L (ref 3.5–5.3)
Sodium: 141 mmol/L (ref 135–146)
eGFR: 46 mL/min/{1.73_m2} — ABNORMAL LOW (ref >=60)

## 2022-10-02 NOTE — Progress Notes (Signed)
Hi Connie Mills, your kidney function continues to trend.  Sore definitely continue to work this up further.  I am not sure what may be going on so the first thing we need to do is get an ultrasound of your kidneys.  Medical ahead and place the order and try to get you in.  Would also like to get some extra blood work and you can do that at your convenience anytime here in our office between 8-11 30 and 1:00 to 430.

## 2022-10-07 ENCOUNTER — Ambulatory Visit (INDEPENDENT_AMBULATORY_CARE_PROVIDER_SITE_OTHER): Payer: BC Managed Care – PPO

## 2022-10-07 DIAGNOSIS — R7989 Other specified abnormal findings of blood chemistry: Secondary | ICD-10-CM

## 2022-10-12 ENCOUNTER — Ambulatory Visit: Payer: BC Managed Care – PPO | Admitting: Sports Medicine

## 2022-10-12 ENCOUNTER — Encounter: Payer: Self-pay | Admitting: Family Medicine

## 2022-10-12 DIAGNOSIS — M79671 Pain in right foot: Secondary | ICD-10-CM

## 2022-10-12 DIAGNOSIS — G8929 Other chronic pain: Secondary | ICD-10-CM

## 2022-10-12 NOTE — Progress Notes (Signed)
Hi Charm,  News!  Kidneys look good in appearance.  No worrisome mass or swelling or lesions.  No abnormalities.

## 2022-10-12 NOTE — Assessment & Plan Note (Signed)
Now 2 weeks in the postop shoe, MRI did confirm fifth metatarsal stress injury with intense marrow edema. She has improved dramatically, but still has some discomfort with ambulation as well as to palpation fifth metatarsal shaft. We will continue the postop shoe for at least another month, I added a lateral posting wedge. We will consider updated MRI at the follow-up.

## 2022-10-12 NOTE — Progress Notes (Signed)
    Procedures performed today:    None.  Independent interpretation of notes and tests performed by another provider:   None.  Brief History, Exam, Impression, and Recommendations:    Chronic foot pain, right Now 2 weeks in the postop shoe, MRI did confirm fifth metatarsal stress injury with intense marrow edema. She has improved dramatically, but still has some discomfort with ambulation as well as to palpation fifth metatarsal shaft. We will continue the postop shoe for at least another month, I added a lateral posting wedge. We will consider updated MRI at the follow-up.    ____________________________________________ Ihor Austin. Benjamin Stain, M.D., ABFM., CAQSM., AME. Primary Care and Sports Medicine [facility]  Adjunct Professor of Family Medicine  [place] [employer] [facility] [employer]  Restaurant manager, fast food

## 2022-10-13 LAB — CBC WITH DIFFERENTIAL/PLATELET
Absolute Monocytes: 495 cells/uL (ref 200–950)
Basophils Absolute: 59 cells/uL (ref 0–200)
Basophils Relative: 0.6 %
Eosinophils Absolute: 139 cells/uL (ref 15–500)
Eosinophils Relative: 1.4 %
HCT: 30.3 % — ABNORMAL LOW (ref 35.0–45.0)
Hemoglobin: 9.9 g/dL — ABNORMAL LOW (ref 11.7–15.5)
Lymphs Abs: 1881 cells/uL (ref 850–3900)
MCH: 23.5 pg — ABNORMAL LOW (ref 27.0–33.0)
MCHC: 32.7 g/dL (ref 32.0–36.0)
MCV: 71.8 fL — ABNORMAL LOW (ref 80.0–100.0)
MPV: 10.3 fL (ref 7.5–12.5)
Monocytes Relative: 5 %
Neutro Abs: 7326 cells/uL (ref 1500–7800)
Neutrophils Relative %: 74 %
Platelets: 441 10*3/uL — ABNORMAL HIGH (ref 140–400)
RBC: 4.22 10*6/uL (ref 3.80–5.10)
RDW: 15.6 % — ABNORMAL HIGH (ref 11.0–15.0)
Total Lymphocyte: 19 %
WBC: 9.9 10*3/uL (ref 3.8–10.8)

## 2022-10-13 LAB — HEPATITIS C ANTIBODY: Hepatitis C Ab: NONREACTIVE

## 2022-10-13 LAB — MICROALBUMIN / CREATININE URINE RATIO
Creatinine, Urine: 93 mg/dL (ref 20–275)
Microalb Creat Ratio: 230 mcg/mg creat — ABNORMAL HIGH (ref <30)
Microalb, Ur: 21.4 mg/dL

## 2022-10-13 LAB — ANA: Anti Nuclear Antibody (ANA): NEGATIVE

## 2022-10-13 LAB — URINALYSIS, MICROSCOPIC ONLY
Bacteria, UA: NONE SEEN /HPF
Hyaline Cast: NONE SEEN /LPF
RBC / HPF: NONE SEEN /HPF (ref 0–2)
WBC, UA: NONE SEEN /HPF (ref 0–5)

## 2022-10-13 LAB — C-REACTIVE PROTEIN: CRP: 29.6 mg/L — ABNORMAL HIGH (ref <8.0)

## 2022-10-13 LAB — HIV ANTIBODY (ROUTINE TESTING W REFLEX): HIV 1&2 Ab, 4th Generation: NONREACTIVE

## 2022-10-13 LAB — SEDIMENTATION RATE: Sed Rate: 110 mm/h — ABNORMAL HIGH (ref 0–20)

## 2022-10-14 NOTE — Progress Notes (Signed)
HI Connie Mills,  You are spilling some extra protein into your urine.  Its not a large amount is a small amount but it is still present.  Your inflammatory markers are also elevated indicating some inflammation going on.  Hemoglobin is dropped just slightly compared to a month ago it was 9.9 it was 10.3 before.  Negative for HIV and hepatitis C.  Negative for lupus.  No sign of infection.  Additional test still pending.  I would like to go ahead and then make a referral to a kidney specialist to get their help in figuring out why your kidney function is trending upward since her inflammation markers are little elevated.  If you are okay with that please let me know.  I still want to get the additional test and have that cooking because I do think that is helpful.

## 2022-10-24 LAB — PROTEIN ELECTROPHORESIS,W/TOTAL PROTEIN AND RFLX TO IFE,URINE
Albumin: 62 %
Alpha-1-Globulin, U: 4 %
Alpha-2-Globulin, U: 12 %
Beta Globulin, U: 11 %
Creatinine, 24H Ur: 1.47 g/(24.h) (ref 0.50–2.15)
Gamma Globulin, U: 12 %
PROTEIN/CREATININE RATIO: 0.201 mg/mg creat — ABNORMAL HIGH (ref <0.150)
PROTEIN/CREATININE RATIO: 201 mg/g creat — ABNORMAL HIGH (ref <150)
Protein, 24H Urine: 296 mg/24 h — ABNORMAL HIGH (ref 0–149)

## 2022-10-26 ENCOUNTER — Other Ambulatory Visit: Payer: Self-pay | Admitting: Family Medicine

## 2022-10-26 DIAGNOSIS — R7989 Other specified abnormal findings of blood chemistry: Secondary | ICD-10-CM

## 2022-10-26 NOTE — Progress Notes (Signed)
Orders Placed This Encounter  Procedures  . Ambulatory referral to Nephrology    Referral Priority:   Urgent    Referral Type:   Consultation    Referral Reason:   Specialty Services Required    Requested Specialty:   Nephrology    Number of Visits Requested:   1    

## 2022-10-27 NOTE — Progress Notes (Signed)
Connie Mills, you do have some extra protein in the urine but no sign of condition called multiple myeloma.  Is reassuring.  I have put in a referral to nephrology so hopefully will hear something by the end of the week.

## 2022-10-27 NOTE — Progress Notes (Signed)
I do not think it has anything that she is specifically I just think it is a sign that the kidneys are feeling stressed so it is allowing protein to pass when normally it should not or at least not in larger amounts.  She can decrease the meat proteins in her diet but still eat things like nuts and yogurt etc.  But she does not have to be strict about it.  See if she has heard back from nephrology yet?

## 2022-11-04 ENCOUNTER — Telehealth: Payer: Self-pay | Admitting: *Deleted

## 2022-11-04 NOTE — Telephone Encounter (Signed)
Left patient a message to call and schedule annual and Depo together, 01/16 - 12/15/22.

## 2022-11-11 ENCOUNTER — Ambulatory Visit: Payer: BC Managed Care – PPO | Admitting: Sports Medicine

## 2022-11-11 DIAGNOSIS — G8929 Other chronic pain: Secondary | ICD-10-CM | POA: Diagnosis not present

## 2022-11-11 DIAGNOSIS — M79671 Pain in right foot: Secondary | ICD-10-CM

## 2022-11-11 NOTE — Assessment & Plan Note (Signed)
Has improved considerably after approximately 6 weeks of immobilization in the postop shoe, MRI did show edema fifth metatarsal consistent with stress injury. Since symptoms have resolved she can stay off of her postop shoe, and I am going to add some home conditioning, return to see me as needed.

## 2022-11-11 NOTE — Progress Notes (Signed)
    Procedures performed today:    None.  Independent interpretation of notes and tests performed by another provider:   None.  Brief History, Exam, Impression, and Recommendations:    Chronic foot pain, right Has improved considerably after approximately 6 weeks of immobilization in the postop shoe, MRI did show edema fifth metatarsal consistent with stress injury. Since symptoms have resolved she can stay off of her postop shoe, and I am going to add some home conditioning, return to see me as needed.    ____________________________________________ Ihor Austin. Benjamin Stain, M.D., ABFM., CAQSM., AME. Primary Care and Sports Medicine [facility]  Adjunct Professor of Family Medicine  [place] [employer] [facility] [employer]  Restaurant manager, fast food

## 2022-11-30 ENCOUNTER — Encounter: Payer: Self-pay | Admitting: Obstetrics & Gynecology

## 2022-11-30 ENCOUNTER — Ambulatory Visit (INDEPENDENT_AMBULATORY_CARE_PROVIDER_SITE_OTHER): Payer: BC Managed Care – PPO | Admitting: Obstetrics & Gynecology

## 2022-11-30 ENCOUNTER — Other Ambulatory Visit (HOSPITAL_COMMUNITY)
Admission: RE | Admit: 2022-11-30 | Discharge: 2022-11-30 | Disposition: A | Discharge status: Home / Self Care | Payer: BC Managed Care – PPO | Source: Ambulatory Visit | Attending: Obstetrics & Gynecology | Admitting: Obstetrics & Gynecology

## 2022-11-30 VITALS — BP 141/82 | HR 70 | Ht 67.0 in | Wt 233.0 lb

## 2022-11-30 DIAGNOSIS — Z3042 Encounter for surveillance of injectable contraceptive: Secondary | ICD-10-CM

## 2022-11-30 DIAGNOSIS — Z1231 Encounter for screening mammogram for malignant neoplasm of breast: Secondary | ICD-10-CM | POA: Diagnosis not present

## 2022-11-30 DIAGNOSIS — Z01419 Encounter for gynecological examination (general) (routine) without abnormal findings: Secondary | ICD-10-CM | POA: Diagnosis not present

## 2022-11-30 MED ORDER — MEDROXYPROGESTERONE ACETATE 150 MG/ML IM SUSP: 150.0000 mg | INTRAMUSCULAR | 4 refills | Status: DC

## 2022-11-30 NOTE — Progress Notes (Signed)
WELL-WOMAN EXAMINATION Patient name: Connie Mills MRN [id number]  Date of birth: [birth year] Chief Complaint:   Annual Exam  History of Present Illness:   Connie Mills is a 47 y.o. [phone] female being seen today for a routine well-woman exam.   Still on Depot for contraception.  Typically doesn't have bleeding. Occasionally when stressed will have some slight spotting and odor.  She notes mild odor currently.  Denies irregular discharge.  Denies itching.  Denies pelvic or abdominal pain.  No other acute complaints.   No LMP recorded. Patient has had an injection.  The current method of family planning is Depo-Provera injections.    Last pap 2021.  Last mammogram: 12/2021 Last colonoscopy: 2021     02/06/2022    3:54 PM 02/05/2021    1:26 PM 05/06/2020    2:19 PM 04/12/2019    9:41 AM 01/27/2019    1:13 PM  Depression screen PHQ 2/9  Decreased Interest 0 1 1 0 0  Down, Depressed, Hopeless 0 1 1 0 0  PHQ - 2 Score 0 2 2 0 0  Altered sleeping  3 1    Tired, decreased energy  1 1    Change in appetite  0 0    Feeling bad or failure about yourself   0 0    Trouble concentrating  0 1    Moving slowly or fidgety/restless  1 0    Suicidal thoughts  0 0    PHQ-9 Score  7 5    Difficult doing work/chores  Not difficult at all Not difficult at all        Review of Systems:   Pertinent items are noted in HPI Denies any headaches, blurred vision, fatigue, shortness of breath, chest pain, abdominal pain, bowel movements, urination, or intercourse unless otherwise stated above.  Pertinent History Reviewed:  Reviewed past medical,surgical, social and family history.  Reviewed problem list, medications and allergies. Physical Assessment:   Vitals:   11/30/22 1049 11/30/22 1113  BP: (!) 155/75 (!) 141/82  Pulse: 73 70  Weight: 233 lb (105.7 kg)   Height: 5\' 7"  (1.702 m)   Body mass index is 36.49 kg/m.        Physical Examination:   General appearance - well appearing, and in  no distress  Mental status - alert, oriented to person, place, and time  Psych:  She has a normal mood and affect  Skin - warm and dry, normal color, no suspicious lesions noted  Chest - effort normal, all lung fields clear to auscultation bilaterally  Heart - normal rate and regular rhythm  Neck:  midline trachea, no thyromegaly or nodules  Breasts - breasts appear normal, no suspicious masses, no skin or nipple changes or  axillary nodes  Abdomen - soft, nontender, nondistended, no masses or organomegaly  Pelvic - VULVA: normal appearing vulva with no masses, tenderness or lesions  VAGINA: normal appearing vagina with normal color and discharge, no lesions  CERVIX: normal appearing cervix without discharge or lesions, no CMT  Thin prep pap is done with HR HPV cotesting  UTERUS: uterus is felt to be normal size, shape, consistency and nontender  retroverted  ADNEXA: No adnexal masses or tenderness noted.  Extremities:  No swelling or varicosities noted  Chaperone:  Deanna Sola      Assessment & Plan:  1) Well-Woman Exam -pap collected, reviewed screening guidelines -mammogram ordered  2) Contraception -reviewed that likelihood of pregnancy low and likely perimenopausal -discussed concern  of long-term Depot use and impact on bone health -after much discussion, pt wishes to continue at this time []  may reconsider stopping next year -encouraged MVI/vit D supplementation  Orders Placed This Encounter  Procedures   MM Digital Screening    Meds:  Meds ordered this encounter  Medications   medroxyPROGESTERone (DEPO-PROVERA) 150 MG/ML injection    Sig: Inject 1 mL (150 mg total) into the muscle every 3 (three) months.    Dispense:  1 mL    Refill:  4    Follow-up: Return in about 1 year (around 12/01/2023) for Annual.   Myna Hidalgo, DO Attending Obstetrician & Gynecologist, [facility] Center for [facility], [facility]

## 2022-11-30 NOTE — Patient Instructions (Signed)
Please schedule a mammogram at one of the following locations:  [facility] in [place]:[phone] [address]  

## 2022-12-01 LAB — CYTOLOGY - PAP
Comment: NEGATIVE
Diagnosis: UNDETERMINED — AB
High risk HPV: NEGATIVE

## 2022-12-11 ENCOUNTER — Ambulatory Visit (INDEPENDENT_AMBULATORY_CARE_PROVIDER_SITE_OTHER): Payer: BC Managed Care – PPO

## 2022-12-11 DIAGNOSIS — Z3042 Encounter for surveillance of injectable contraceptive: Secondary | ICD-10-CM

## 2022-12-11 MED ORDER — MEDROXYPROGESTERONE ACETATE 150 MG/ML IM SUSP
150.0000 mg | Freq: Once | INTRAMUSCULAR | Status: AC
  Administered 2022-12-11: 150 mg via INTRAMUSCULAR

## 2022-12-11 NOTE — Progress Notes (Signed)
Pt here for Depo Provera injection. Pt did supply meds. Injection given and pt will return in April for next injection.  [place]: [id number] Lot: [id number] EXP: 05/17/23

## 2022-12-29 ENCOUNTER — Encounter: Payer: Self-pay | Admitting: Family Medicine

## 2022-12-29 DIAGNOSIS — E611 Iron deficiency: Secondary | ICD-10-CM

## 2022-12-29 MED ORDER — FUSION PLUS PO CAPS: 1.0000 | ORAL_CAPSULE | Freq: Every day | ORAL | 3 refills | Status: DC

## 2023-01-05 ENCOUNTER — Other Ambulatory Visit: Payer: Self-pay | Admitting: Pharmacist

## 2023-01-05 NOTE — Progress Notes (Signed)
Patient appearing on report for [employer] [facility] - Hypertension Control report due to last documented ambulatory blood pressure of 141/82 on 11/30/22. Next appointment with PCP is 02/01/23   Outreached patient to discuss hypertension control and medication management. Left voicemail for patient to return my call at their convenience.   Larinda Buttery, PharmD Clinical Pharmacist [facility] [phone]

## 2023-02-01 ENCOUNTER — Ambulatory Visit: Payer: BC Managed Care – PPO | Admitting: Family Medicine

## 2023-02-09 ENCOUNTER — Ambulatory Visit
Admission: RE | Admit: 2023-02-09 | Discharge: 2023-02-09 | Disposition: A | Discharge status: Home / Self Care | Payer: BC Managed Care – PPO | Source: Ambulatory Visit | Attending: Obstetrics & Gynecology | Admitting: Obstetrics & Gynecology

## 2023-02-09 DIAGNOSIS — Z1231 Encounter for screening mammogram for malignant neoplasm of breast: Secondary | ICD-10-CM

## 2023-02-22 ENCOUNTER — Encounter: Payer: Self-pay | Admitting: Family Medicine

## 2023-02-22 ENCOUNTER — Ambulatory Visit: Payer: BC Managed Care – PPO | Admitting: Family Medicine

## 2023-02-22 VITALS — BP 126/76 | HR 91 | Ht 67.0 in | Wt 228.0 lb

## 2023-02-22 DIAGNOSIS — I10 Essential (primary) hypertension: Secondary | ICD-10-CM | POA: Diagnosis not present

## 2023-02-22 DIAGNOSIS — R7989 Other specified abnormal findings of blood chemistry: Secondary | ICD-10-CM

## 2023-02-22 DIAGNOSIS — R7301 Impaired fasting glucose: Secondary | ICD-10-CM

## 2023-02-22 DIAGNOSIS — E611 Iron deficiency: Secondary | ICD-10-CM

## 2023-02-22 LAB — POCT GLYCOSYLATED HEMOGLOBIN (HGB A1C): Hemoglobin A1C: 6.1 % — AB (ref 4.0–5.6)

## 2023-02-22 MED ORDER — METOPROLOL SUCCINATE ER 100 MG PO TB24
ORAL_TABLET | ORAL | 3 refills | Status: DC
Start: 2023-02-22 — End: 2024-02-22

## 2023-02-22 MED ORDER — AMLODIPINE BESY-BENAZEPRIL HCL 10-40 MG PO CAPS: 1.0000 | ORAL_CAPSULE | Freq: Every day | ORAL | 3 refills | Status: DC

## 2023-02-22 NOTE — Progress Notes (Signed)
Established Patient Office Visit  Subjective   Patient ID: Connie Mills, female    DOB: 06/15/1976  Age: 47 y.o. MRN: [id number]  Chief Complaint  Patient presents with   Hypertension    HPI Hypertension- Pt denies chest pain, SOB, dizziness, or heart palpitations.  Taking meds as directed w/o problems.  Denies medication side effects.  Has lost some weight.  She has been at the gym working out.   [facility] January of this year by Dr. Sherin Quarry for rising creatinine.  She does have follow-up with the kidney doctor later this month and will get updated labs.  Out like it was an acute kidney injury versus progressive CKD 3.  Stressed the importance of getting her blood pressure under really good control.    ROS    Objective:     BP 126/76   Pulse 91   Ht 5\' 7"  (1.702 m)   Wt 228 lb (103.4 kg)   SpO2 97%   BMI 35.71 kg/m    Physical Exam Vitals and nursing note reviewed.  Constitutional:      Appearance: She is well-developed.  HENT:     Head: Normocephalic and atraumatic.  Cardiovascular:     Rate and Rhythm: Normal rate and regular rhythm.     Heart sounds: Normal heart sounds.  Pulmonary:     Effort: Pulmonary effort is normal.     Breath sounds: Normal breath sounds.  Skin:    General: Skin is warm and dry.  Neurological:     Mental Status: She is alert and oriented to person, place, and time.  Psychiatric:        Behavior: Behavior normal.      Results for orders placed or performed in visit on 02/22/23  POCT glycosylated hemoglobin (Hb A1C)  Result Value Ref Range   Hemoglobin A1C 6.1 (A) 4.0 - 5.6 %   HbA1c POC (<> result, manual entry)     HbA1c, POC (prediabetic range)     HbA1c, POC (controlled diabetic range)        The 10-year ASCVD risk score (Arnett DK, et al., 2019) is: 4.5%    Assessment & Plan:   Problem List Items Addressed This Visit       Cardiovascular and Mediastinum   Hypertension,  essential, benign - Primary    Pressure looks absolutely phenomenal today and she is on really on track.  She is doing fantastic.  Working out exercising her weight is down and her blood pressure looks great today.  Refills sent to the pharmacy.  We did not do any additional blood work because she is following back up with a nephrologist next month and they are going to repeat labs.  We did go ahead and do an A1c today.      Relevant Medications   metoprolol succinate (TOPROL-XL) 100 MG 24 hr tablet   amLODipine-benazepril (LOTREL) 10-40 MG capsule     Endocrine   IFG (impaired fasting glucose)    Lab Results  Component Value Date   HGBA1C 6.1 (A) 02/22/2023  A1c is stable in the prediabetes range continue to work on healthy food choices and regular exercise and plan to recheck again in 6 months.      Relevant Orders   POCT glycosylated hemoglobin (Hb A1C) (Completed)     Other   Elevated serum creatinine    Follow-up next month with nephrology I am hoping that serum creatinine has improved  and that the previous elevation was more related to an acute kidney injury versus CKD 3.  She is really doing great and back on track with her health.  Follow-up in 6 months.      Other Visit Diagnoses     Iron deficiency           Return in about 6 months (around 08/24/2023) for Hypertension.    Nani Gasser, MD

## 2023-02-22 NOTE — Assessment & Plan Note (Signed)
Follow-up next month with nephrology I am hoping that serum creatinine has improved and that the previous elevation was more related to an acute kidney injury versus CKD 3.  She is really doing great and back on track with her health.  Follow-up in 6 months.

## 2023-02-22 NOTE — Patient Instructions (Signed)
Kidney doc wanted her to see her back In May _ [facility].

## 2023-02-22 NOTE — Assessment & Plan Note (Signed)
Pressure looks absolutely phenomenal today and she is on really on track.  She is doing fantastic.  Working out exercising her weight is down and her blood pressure looks great today.  Refills sent to the pharmacy.  We did not do any additional blood work because she is following back up with a nephrologist next month and they are going to repeat labs.  We did go ahead and do an A1c today.

## 2023-02-22 NOTE — Assessment & Plan Note (Signed)
Lab Results  Component Value Date   HGBA1C 6.1 (A) 02/22/2023   A1c is stable in the prediabetes range continue to work on healthy food choices and regular exercise and plan to recheck again in 6 months.

## 2023-03-11 ENCOUNTER — Ambulatory Visit (INDEPENDENT_AMBULATORY_CARE_PROVIDER_SITE_OTHER): Payer: BC Managed Care – PPO

## 2023-03-11 DIAGNOSIS — Z3042 Encounter for surveillance of injectable contraceptive: Secondary | ICD-10-CM

## 2023-03-11 MED ORDER — MEDROXYPROGESTERONE ACETATE 150 MG/ML IM SUSP
150.0000 mg | Freq: Once | INTRAMUSCULAR | Status: AC
  Administered 2023-03-11: 150 mg via INTRAMUSCULAR

## 2023-03-11 NOTE — Progress Notes (Signed)
Pt here today for Depo Provera injection.  She does provide her own medication.

## 2023-05-12 ENCOUNTER — Other Ambulatory Visit: Payer: Self-pay | Admitting: *Deleted

## 2023-05-12 DIAGNOSIS — I10 Essential (primary) hypertension: Secondary | ICD-10-CM

## 2023-05-12 MED ORDER — CHLORTHALIDONE 25 MG PO TABS: 25.0000 mg | ORAL_TABLET | Freq: Every day | ORAL | 3 refills | Status: DC

## 2023-06-04 ENCOUNTER — Ambulatory Visit (INDEPENDENT_AMBULATORY_CARE_PROVIDER_SITE_OTHER): Payer: BC Managed Care – PPO

## 2023-06-04 DIAGNOSIS — Z3042 Encounter for surveillance of injectable contraceptive: Secondary | ICD-10-CM

## 2023-06-04 MED ORDER — MEDROXYPROGESTERONE ACETATE 150 MG/ML IM SUSP
150.0000 mg | Freq: Once | INTRAMUSCULAR | Status: AC
Start: 2023-06-04 — End: 2023-06-04
  Administered 2023-06-04: 150 mg via INTRAMUSCULAR

## 2023-06-04 NOTE — Progress Notes (Signed)
Pt here for Depo Provera injection. Pt did supply meds.  [facility]: [id number] Lot: [id number] Exp: 01/14/2025

## 2023-06-18 ENCOUNTER — Ambulatory Visit (INDEPENDENT_AMBULATORY_CARE_PROVIDER_SITE_OTHER): Payer: BC Managed Care – PPO

## 2023-06-18 ENCOUNTER — Encounter: Payer: Self-pay | Admitting: Family Medicine

## 2023-06-18 ENCOUNTER — Ambulatory Visit: Payer: BC Managed Care – PPO | Admitting: Family Medicine

## 2023-06-18 VITALS — BP 134/79 | HR 73 | Ht 67.0 in | Wt 228.0 lb

## 2023-06-18 DIAGNOSIS — M25511 Pain in right shoulder: Secondary | ICD-10-CM

## 2023-06-18 MED ORDER — CELECOXIB 200 MG PO CAPS: 200.0000 mg | ORAL_CAPSULE | Freq: Two times a day (BID) | ORAL | 1 refills | Status: DC

## 2023-06-18 NOTE — Progress Notes (Signed)
Acute Office Visit  Subjective:     Patient ID: Connie Mills, female    DOB: 1976-04-07, 47 y.o.   MRN: [id number]  Chief Complaint  Patient presents with   Shoulder Pain    R shoulder pain. She stated that it started 3 mos ago however, she re injured her shoulder it last week. She reports the pain as being at a level 12 out of 10 feeling like pins. The pain starts just above her elbow and goes up to her shoulder. She has tried Tramadol, Tylenol, and Tylenol PM, ice/heat, and muscle rub and nothing rub.    HPI Patient is in today for Right shoulder pain.  Has been bothering her on and off for about 3 months.  But about a week ago she was reaching back to put her purse in the backseat in her car which she does every morning and felt sudden increase in pain and ever since then she is also had decreased range of motion she has been using Tylenol, heat, ice, tramadol, muscle rubs etc.  ROS      Objective:    BP 134/79   Pulse 73   Ht 5\' 7"  (1.702 m)   Wt 228 lb (103.4 kg)   SpO2 100%   BMI 35.71 kg/m    Physical Exam Vitals reviewed.  Constitutional:      Appearance: She is well-developed.  HENT:     Head: Normocephalic and atraumatic.  Eyes:     Conjunctiva/sclera: Conjunctivae normal.  Cardiovascular:     Rate and Rhythm: Normal rate.  Pulmonary:     Effort: Pulmonary effort is normal.  Musculoskeletal:     Comments: She has extension to about 60 degrees and cannot get past 5 without significant pain or discomfort.  She has decreased passive external rotation.  She is unable to just reach beyond her belt loops at her back.  She is slightly tender over the distal acromion.  A little bit tender over the head of the bicep as well. Unable to raise arm high enough to empty can test.   Skin:    General: Skin is dry.     Coloration: Skin is not pale.  Neurological:     Mental Status: She is alert and oriented to person, place, and time.  Psychiatric:        Behavior:  Behavior normal.     No results found for any visits on 06/18/23.      Assessment & Plan:   Problem List Items Addressed This Visit   None Visit Diagnoses     Acute pain of right shoulder    -  Primary   Relevant Orders   DG Shoulder Right      Fact that she is getting of frozen shoulder.  Will get plain films today and get scheduled with sports medicine for further evaluation.  Also consider rotator cuff injury. ..  Start Celebrex over the weekend and recommend ice.  Meds ordered this encounter  Medications   celecoxib (CELEBREX) 200 MG capsule    Sig: Take 1 capsule (200 mg total) by mouth 2 (two) times daily.    Dispense:  60 capsule    Refill:  1    No follow-ups on file.  Nani Gasser, MD

## 2023-06-21 ENCOUNTER — Encounter: Payer: Self-pay | Admitting: Family Medicine

## 2023-06-21 NOTE — Progress Notes (Signed)
HI Connie Mills, x-ray shows you have some mild arthritis in the glenohumeral joint that is more the ball-and-socket part of the shoulder as well as the acromioclavicular this is where the clavicle comes to the distal part of the shoulder there is also a small subacromial bone spur.  I still think he would benefit from seeing sports med or Ortho.  Were happy to get that scheduled for you just let me know.

## 2023-06-28 ENCOUNTER — Ambulatory Visit: Payer: BC Managed Care – PPO | Admitting: Sports Medicine

## 2023-06-28 ENCOUNTER — Other Ambulatory Visit (INDEPENDENT_AMBULATORY_CARE_PROVIDER_SITE_OTHER): Payer: BC Managed Care – PPO

## 2023-06-28 DIAGNOSIS — M19011 Primary osteoarthritis, right shoulder: Secondary | ICD-10-CM

## 2023-06-28 NOTE — Assessment & Plan Note (Signed)
Pleasant 47 year old female, several months of increasing pain right shoulder localized at the joint line with progressive loss of motion. She saw her PCP and was referred to me, she did have x-rays done that showed osteoarthritis. On exam she has loss of external rotation, loss of abduction, all the above points to osteoarthritis, adhesive capsulitis can present similarly but with the finding of osteoarthritis on the x-rays I think this is the primary process. Oral analgesics ineffective, we did a shoulder injection to the glenohumeral joint today, home PT given, return to see me in 6 weeks.

## 2023-06-28 NOTE — Progress Notes (Signed)
    Procedures performed today:    Procedure: Real-time Ultrasound Guided injection of the right glenohumeral joint Device: Samsung HS60  Verbal informed consent obtained.  Time-out conducted.  Noted no overlying erythema, induration, or other signs of local infection.  Skin prepped in a sterile fashion.  Local anesthesia: Topical Ethyl chloride.  With sterile technique and under real time ultrasound guidance: Minimal arthritic changes noted, 1 cc Kenalog 40, 2 cc lidocaine, 2 cc bupivacaine injected easily Completed without difficulty  Advised to call if fevers/chills, erythema, induration, drainage, or persistent bleeding.  Images permanently stored and available for review in PACS.  Impression: Technically successful ultrasound guided injection.  Independent interpretation of notes and tests performed by another provider:   None.  Brief History, Exam, Impression, and Recommendations:    Primary osteoarthritis of right shoulder Pleasant 47 year old female, several months of increasing pain right shoulder localized at the joint line with progressive loss of motion. She saw her PCP and was referred to me, she did have x-rays done that showed osteoarthritis. On exam she has loss of external rotation, loss of abduction, all the above points to osteoarthritis, adhesive capsulitis can present similarly but with the finding of osteoarthritis on the x-rays I think this is the primary process. Oral analgesics ineffective, we did a shoulder injection to the glenohumeral joint today, home PT given, return to see me in 6 weeks.    ____________________________________________ Ihor Austin. Benjamin Stain, M.D., ABFM., CAQSM., AME. Primary Care and Sports Medicine [facility]  Adjunct Professor of Family Medicine  [employer]  Restaurant manager, fast food

## 2023-06-29 DIAGNOSIS — M19011 Primary osteoarthritis, right shoulder: Secondary | ICD-10-CM | POA: Diagnosis not present

## 2023-06-29 MED ORDER — TRIAMCINOLONE ACETONIDE 40 MG/ML IJ SUSP
40.0000 mg | Freq: Once | INTRAMUSCULAR | Status: AC
  Administered 2023-06-29: 40 mg via INTRAMUSCULAR

## 2023-06-29 NOTE — Addendum Note (Signed)
Addended by: Carren Rang A on: 06/29/2023 04:39 PM   Modules accepted: Orders

## 2023-06-30 ENCOUNTER — Encounter: Payer: Self-pay | Admitting: Sports Medicine

## 2023-07-01 MED ORDER — ACETAMINOPHEN-CODEINE 300-60 MG PO TABS: 1.0000 | ORAL_TABLET | Freq: Three times a day (TID) | ORAL | 0 refills | Status: DC | PRN

## 2023-07-02 ENCOUNTER — Encounter: Payer: Self-pay | Admitting: Family Medicine

## 2023-07-02 DIAGNOSIS — M25511 Pain in right shoulder: Secondary | ICD-10-CM

## 2023-07-02 NOTE — Telephone Encounter (Signed)
Orders Placed This Encounter  °Procedures  ° Ambulatory referral to Orthopedic Surgery  °  Referral Priority:   Routine  °  Referral Type:   Surgical  °  Referral Reason:   Specialty Services Required  °  Requested Specialty:   Orthopedic Surgery  °  Number of Visits Requested:   1  °  °

## 2023-07-08 ENCOUNTER — Ambulatory Visit (INDEPENDENT_AMBULATORY_CARE_PROVIDER_SITE_OTHER): Payer: BC Managed Care – PPO | Admitting: Orthopaedic Surgery

## 2023-07-08 ENCOUNTER — Encounter: Payer: Self-pay | Admitting: Orthopaedic Surgery

## 2023-07-08 DIAGNOSIS — M7541 Impingement syndrome of right shoulder: Secondary | ICD-10-CM | POA: Diagnosis not present

## 2023-07-08 MED ORDER — LIDOCAINE HCL 1 % IJ SOLN
3.0000 mL | INTRAMUSCULAR | Status: AC | PRN
Start: 2023-07-08 — End: 2023-07-08
  Administered 2023-07-08: 3 mL

## 2023-07-08 MED ORDER — METHYLPREDNISOLONE ACETATE 40 MG/ML IJ SUSP
40.0000 mg | INTRAMUSCULAR | Status: AC | PRN
Start: 2023-07-08 — End: 2023-07-08
  Administered 2023-07-08: 40 mg via INTRA_ARTICULAR

## 2023-07-08 NOTE — Progress Notes (Signed)
Office Visit Note   Patient: Connie Mills           Date of Birth: [birth year]           MRN: [id number] Visit Date: 07/08/2023              Requested by: Agapito Games, MD [address] [place],  [place] [address] PCP: Agapito Games, MD   Assessment & Plan: Visit Diagnoses: No diagnosis found.  Plan: Will see her back in just 2 weeks to see what type of response she had to the subacromial injection.  She is shown shoulder exercises wall crawls, pendulum, Codman and forward flexion exercises.  Questions were encouraged and answered at length by Dr. Magnus Ivan myself.  She is to be mindful of any radicular symptoms down the arm.  Follow-Up Instructions: Return in about 2 weeks (around 07/22/2023).   Orders:  Orders Placed This Encounter  Procedures   Large Joint Inj   No orders of the defined types were placed in this encounter.     Procedures: Large Joint Inj: R subacromial bursa on 07/08/2023 9:04 AM Indications: pain Details: 22 G 1.5 in needle, superior approach  Arthrogram: No  Medications: 3 mL lidocaine 1 %; 40 mg methylPREDNISolone acetate 40 MG/ML Outcome: tolerated well, no immediate complications Procedure, treatment alternatives, risks and benefits explained, specific risks discussed. Consent was given by the patient. Immediately prior to procedure a time out was called to verify the correct patient, procedure, equipment, support staff and site/side marked as required. Patient was prepped and draped in the usual sterile fashion.       Clinical Data: No additional findings.   Subjective: Chief Complaint  Patient presents with   Right Shoulder - Pain    HPI Connie Mills is a 47 year old female comes in today with right shoulder pain for the past 3 months.  She is had no known injury to the shoulder.  She has pain with range of motion particularly overhead and reaching back.  She describes sensation as a pulling sensation down  into the midshaft of the right humerus.  Most of her pain is anterior shoulder though.  She has tried Celebrex and Tylenol number 4 at night.  Nothing seems to really be helping her pain.  She relates that her pain is 10 out of 10 pain at worst and can occur at any time during the day.  She has had no therapy.  She has tried heat ice.  She does note some neck pain.  No real numbness but she cannot exclude tingling down the arm at times.  She underwent an intra-articular injection last Monday at the sports medicine clinic this was under ultrasound.  She states that once the cold spray wore off her pain returned. Right shoulder radiographs are reviewed dated 06/18/2023.  Shows some mild arthritic changes but overall the glenohumeral joint is well-maintained.  Again mild arthritic changes AC joint.  No acute fractures acute findings.  Shoulder is well located.  Review of Systems  Constitutional:  Negative for chills and fever.  Musculoskeletal:  Positive for arthralgias and neck pain.     Objective: Vital Signs: There were no vitals taken for this visit.  Physical Exam Constitutional:      Appearance: She is not ill-appearing or diaphoretic.  Pulmonary:     Effort: Pulmonary effort is normal.  Neurological:     Mental Status: She is alert and oriented to person, place, and time.  Psychiatric:        Mood and Affect: Mood normal.        Behavior: Behavior normal.     Ortho Exam Cervical spine: Slight stiffness with rotation.  Full flexion extension.  Spurling's positive.  Tenderness medial border of the right scapula and right trapezius region. Shoulders bilateral shoulders 5 out of 5 strength with external and internal rotation against resistance empty can test is negative bilaterally.  Positive impingement right shoulder negative on the left.  Negative liftoff test on the left.  Patient is unable to perform liftoff test on the right.  Specialty Comments:  No specialty comments  available.  Imaging: No results found.   PMFS History: Patient Active Problem List   Diagnosis Date Noted   Primary osteoarthritis of right shoulder 06/28/2023   Elevated serum creatinine 02/22/2023   Adjustment disorder with depressed mood 08/14/2022   Chronic foot pain, right 05/05/2021   IFG (impaired fasting glucose) 09/15/2020   Frequent headaches 08/06/2020   Family history of thyroid disease 03/20/2016   Atypical squamous cells of undetermined significance (ASCUS) on Papanicolaou smear of cervix 09/15/2014   Recurrent oral herpes simplex 08/07/2014   Heart murmur 04/03/2014   Hypertension, essential, benign 12/05/2012   Anemia 02/01/2012   Past Medical History:  Diagnosis Date   Abnormal Pap smear of cervix 2005   Leep; normal pap after   AMA (advanced maternal age) multigravida 35+    Anemia    having chronic diarrhea since Sept 2021   Blood transfusion May 16, 1992   "busted blood vessel behind incision", led to bleeding.   Blood transfusion complicating pregnancy 1993   Hypertension    labetolol 200 bid   Irregular heart beat    Has been evaluated and is monitored.    Family History  Problem Relation Age of Onset   Hypertension Mother    Hypertension Brother    Diabetes Maternal Grandmother    Cancer Paternal Grandfather    Hypertension Sister    Thyroid disease Sister    Thyroid disease Maternal Aunt    Colon cancer Maternal Grandfather 72   Other Neg Hx    Colon polyps Neg Hx    Esophageal cancer Neg Hx    Rectal cancer Neg Hx    Stomach cancer Neg Hx     Past Surgical History:  Procedure Laterality Date   CERVICAL BIOPSY  W/ LOOP ELECTRODE EXCISION     CESAREAN SECTION  1993    Breech, blood loss transfusion with "busted blood vessels behind incision"   WISDOM TOOTH EXTRACTION  2003   Social History   Occupational History   Occupation: Curator: [employer]  Tobacco Use   Smoking status: Never   Smokeless tobacco: Never   Vaping Use   Vaping status: Never Used  Substance and Sexual Activity   Alcohol use: No   Drug use: No   Sexual activity: Yes    Partners: Male    Birth control/protection: Injection    Comment: Depo

## 2023-07-26 ENCOUNTER — Encounter: Payer: Self-pay | Admitting: Orthopaedic Surgery

## 2023-07-26 ENCOUNTER — Ambulatory Visit: Payer: BC Managed Care – PPO | Admitting: Orthopaedic Surgery

## 2023-07-26 DIAGNOSIS — M7541 Impingement syndrome of right shoulder: Secondary | ICD-10-CM | POA: Diagnosis not present

## 2023-07-26 NOTE — Progress Notes (Signed)
The patient reports that she is making improvements with her right shoulder impingement syndrome.  She is 2 weeks out from her recent injection in her right shoulder subacromial outlet and she says she is about 85% better.  On exam her forward flexion and abduction is better of the right shoulder.  Reaching behind her is still little bit limited but she is making good progress.  She states that she does not have time for physical therapy.  I showed her some exercises to try as well as looking at some Internet sites for some videos that can help her with her shoulder.  From our standpoint we will wait at least another 2 months before considering a steroid injection again.  If this does not get better the neck step would be obtaining a MRI of the right shoulder.

## 2023-08-10 ENCOUNTER — Ambulatory Visit: Payer: BC Managed Care – PPO | Admitting: Sports Medicine

## 2023-08-24 ENCOUNTER — Ambulatory Visit: Payer: BC Managed Care – PPO | Admitting: Family Medicine

## 2023-08-25 ENCOUNTER — Ambulatory Visit (INDEPENDENT_AMBULATORY_CARE_PROVIDER_SITE_OTHER): Payer: BC Managed Care – PPO | Admitting: *Deleted

## 2023-08-25 DIAGNOSIS — Z3042 Encounter for surveillance of injectable contraceptive: Secondary | ICD-10-CM | POA: Diagnosis not present

## 2023-08-25 MED ORDER — MEDROXYPROGESTERONE ACETATE 150 MG/ML IM SUSP
150.0000 mg | Freq: Once | INTRAMUSCULAR | Status: AC
Start: 2023-08-25 — End: 2023-08-25
  Administered 2023-08-25: 150 mg via INTRAMUSCULAR

## 2023-08-25 NOTE — Progress Notes (Signed)
Pt here for Depo Provera injection only.  Med given IM in Lt deltoid.  Pt does provide her own medication.  Pt is currently on antibiotic for a tooth problem and states that she always gets a yeast infection when on antibiotics.  She is wondering if she does get one could she get Diflucan called in.

## 2023-08-27 ENCOUNTER — Other Ambulatory Visit: Payer: Self-pay

## 2023-08-27 ENCOUNTER — Ambulatory Visit: Payer: BC Managed Care – PPO

## 2023-08-27 DIAGNOSIS — B379 Candidiasis, unspecified: Secondary | ICD-10-CM

## 2023-08-27 MED ORDER — FLUCONAZOLE 150 MG PO TABS
150.0000 mg | ORAL_TABLET | Freq: Once | ORAL | 0 refills | Status: AC
Start: 2023-08-27 — End: 2023-08-27

## 2023-09-16 ENCOUNTER — Encounter: Payer: Self-pay | Admitting: Family Medicine

## 2023-09-16 ENCOUNTER — Ambulatory Visit: Payer: BC Managed Care – PPO | Admitting: Family Medicine

## 2023-09-16 VITALS — BP 107/70 | HR 57 | Ht 67.0 in | Wt 221.0 lb

## 2023-09-16 DIAGNOSIS — B3731 Acute candidiasis of vulva and vagina: Secondary | ICD-10-CM | POA: Diagnosis not present

## 2023-09-16 DIAGNOSIS — M19011 Primary osteoarthritis, right shoulder: Secondary | ICD-10-CM

## 2023-09-16 DIAGNOSIS — N181 Chronic kidney disease, stage 1: Secondary | ICD-10-CM | POA: Insufficient documentation

## 2023-09-16 DIAGNOSIS — R7301 Impaired fasting glucose: Secondary | ICD-10-CM

## 2023-09-16 DIAGNOSIS — I10 Essential (primary) hypertension: Secondary | ICD-10-CM | POA: Diagnosis not present

## 2023-09-16 DIAGNOSIS — N1831 Chronic kidney disease, stage 3a: Secondary | ICD-10-CM | POA: Insufficient documentation

## 2023-09-16 MED ORDER — FLUCONAZOLE 150 MG PO TABS
150.0000 mg | ORAL_TABLET | Freq: Once | ORAL | 1 refills | Status: AC
Start: 2023-09-16 — End: 2023-09-16

## 2023-09-16 NOTE — Progress Notes (Signed)
Established Patient Office Visit  Subjective   Patient ID: Connie Mills, female    DOB: 1976-05-28  Age: 47 y.o. MRN: [id number]  Chief Complaint  Patient presents with   Hypertension    HPI Hypertension- Pt denies chest pain, SOB, dizziness, or heart palpitations.  Taking meds as directed w/o problems.  Denies medication side effects.    Impaired fasting glucose-no increased thirst or urination. No symptoms consistent with hypoglycemia.  She had to have a crown replaced and she is on clindamycin currently she feels like she is starting to get a yeast infection.    ROS    Objective:     BP 107/70   Pulse (!) 57   Ht 5\' 7"  (1.702 m)   Wt 221 lb (100.2 kg)   SpO2 100%   BMI 34.61 kg/m    Physical Exam Vitals and nursing note reviewed.  Constitutional:      Appearance: Normal appearance.  HENT:     Head: Normocephalic and atraumatic.  Eyes:     Conjunctiva/sclera: Conjunctivae normal.  Cardiovascular:     Rate and Rhythm: Normal rate and regular rhythm.  Pulmonary:     Effort: Pulmonary effort is normal.     Breath sounds: Normal breath sounds.  Skin:    General: Skin is warm and dry.  Neurological:     Mental Status: She is alert.  Psychiatric:        Mood and Affect: Mood normal.      No results found for any visits on 09/16/23.    The 10-year ASCVD risk score (Arnett DK, et al., 2019) is: 2.4%    Assessment & Plan:   Problem List Items Addressed This Visit       Cardiovascular and Mediastinum   Hypertension, essential, benign - Primary    Well controlled. Continue current regimen. Follow up in  41mo       Relevant Orders   HgB A1c   CMP14+EGFR   Lipid Panel With LDL/HDL Ratio   Urine Microalbumin w/creat. ratio     Endocrine   IFG (impaired fasting glucose)    Check A1c on labs today.      Relevant Orders   HgB A1c   CMP14+EGFR   Lipid Panel With LDL/HDL Ratio   Urine Microalbumin w/creat. ratio     Musculoskeletal and  Integument   Primary osteoarthritis of right shoulder     Genitourinary   CKD stage G1/A2, GFR > 90 and albumin creatinine ratio 30-299 mg/g    Follow-up scheduled with nephrology next month.  Organ to go ahead and get up-to-date renal function and urine microalbumin today.  Blood pressure looks absolutely phenomenal.  Continue ACE inhibitor.      Relevant Orders   HgB A1c   CMP14+EGFR   Lipid Panel With LDL/HDL Ratio   Urine Microalbumin w/creat. ratio   Other Visit Diagnoses     Yeast vaginitis       Relevant Medications   fluconazole (DIFLUCAN) 150 MG tablet      Vaginitis-will treat with Diflucan.  Call if not better.  Return in about 6 months (around 03/15/2024) for Hypertension.    Nani Gasser, MD

## 2023-09-16 NOTE — Assessment & Plan Note (Signed)
Check A1c on labs today.

## 2023-09-16 NOTE — Assessment & Plan Note (Signed)
Well controlled. Continue current regimen. Follow up in  6 mo  

## 2023-09-16 NOTE — Assessment & Plan Note (Signed)
Follow-up scheduled with nephrology next month.  Organ to go ahead and get up-to-date renal function and urine microalbumin today.  Blood pressure looks absolutely phenomenal.  Continue ACE inhibitor.

## 2023-09-17 LAB — CMP14+EGFR
ALT: 11 [IU]/L (ref 0–32)
AST: 13 [IU]/L (ref 0–40)
Albumin: 4.2 g/dL (ref 3.9–4.9)
Alkaline Phosphatase: 73 [IU]/L (ref 44–121)
BUN/Creatinine Ratio: 16 (ref 9–23)
BUN: 26 mg/dL — ABNORMAL HIGH (ref 6–24)
Bilirubin Total: 0.2 mg/dL (ref 0.0–1.2)
CO2: 21 mmol/L (ref 20–29)
Calcium: 9.4 mg/dL (ref 8.7–10.2)
Chloride: 105 mmol/L (ref 96–106)
Creatinine, Ser: 1.62 mg/dL — ABNORMAL HIGH (ref 0.57–1.00)
Globulin, Total: 3.3 g/dL (ref 1.5–4.5)
Glucose: 66 mg/dL — ABNORMAL LOW (ref 70–99)
Potassium: 3.6 mmol/L (ref 3.5–5.2)
Sodium: 142 mmol/L (ref 134–144)
Total Protein: 7.5 g/dL (ref 6.0–8.5)
eGFR: 39 mL/min/{1.73_m2} — ABNORMAL LOW (ref >59)

## 2023-09-17 LAB — MICROALBUMIN / CREATININE URINE RATIO
Creatinine, Urine: 186.4 mg/dL
Microalb/Creat Ratio: 11 mg/g{creat} (ref 0–29)
Microalbumin, Urine: 20.1 ug/mL

## 2023-09-17 LAB — HEMOGLOBIN A1C
Est. average glucose Bld gHb Est-mCnc: 126 mg/dL
Hgb A1c MFr Bld: 6 % — ABNORMAL HIGH (ref 4.8–5.6)

## 2023-09-17 LAB — LIPID PANEL WITH LDL/HDL RATIO
Cholesterol, Total: 208 mg/dL — ABNORMAL HIGH (ref 100–199)
HDL: 34 mg/dL — ABNORMAL LOW (ref >39)
LDL Chol Calc (NIH): 145 mg/dL — ABNORMAL HIGH (ref 0–99)
LDL/HDL Ratio: 4.3 ratio — ABNORMAL HIGH (ref 0.0–3.2)
Triglycerides: 161 mg/dL — ABNORMAL HIGH (ref 0–149)
VLDL Cholesterol Cal: 29 mg/dL (ref 5–40)

## 2023-09-17 NOTE — Addendum Note (Signed)
Addended by: Nani Gasser D on: 09/17/2023 08:19 AM   Modules accepted: Orders

## 2023-09-17 NOTE — Progress Notes (Signed)
Hi Jaleisa,  A1c looks good at 6.0.  Kidney function did trend up just a little bit from 1.4-1.6 but you also look a little bit more dry on your blood work because the BUN is elevated as well.  I would have you cut your diuretic which is the chlorthalidone, in half and just take half a tab daily.  Your blood pressure was a little bit on the lower side anyway so I think we will still be okay in regards to your blood pressure.  But yet let me have you cut that in half and then lets recheck a BMP towards the middle or end of next week.  I already put the lab send so just come sometime then and we will get the blood work drawn.

## 2023-09-25 LAB — BASIC METABOLIC PANEL
BUN/Creatinine Ratio: 14 (ref 9–23)
BUN: 21 mg/dL (ref 6–24)
CO2: 21 mmol/L (ref 20–29)
Calcium: 9.8 mg/dL (ref 8.7–10.2)
Chloride: 106 mmol/L (ref 96–106)
Creatinine, Ser: 1.47 mg/dL — ABNORMAL HIGH (ref 0.57–1.00)
Glucose: 81 mg/dL (ref 70–99)
Potassium: 3.5 mmol/L (ref 3.5–5.2)
Sodium: 141 mmol/L (ref 134–144)
eGFR: 43 mL/min/{1.73_m2} — ABNORMAL LOW (ref >59)

## 2023-09-27 NOTE — Progress Notes (Signed)
Hi Connie Mills, Kidney function looked better this time, closer to where you were. Did you see Nephrology this year or have an appt soon?

## 2023-10-15 ENCOUNTER — Other Ambulatory Visit: Payer: Self-pay | Admitting: Family Medicine

## 2023-11-12 ENCOUNTER — Ambulatory Visit: Payer: BC Managed Care – PPO

## 2023-11-15 ENCOUNTER — Ambulatory Visit (INDEPENDENT_AMBULATORY_CARE_PROVIDER_SITE_OTHER): Payer: BC Managed Care – PPO

## 2023-11-15 DIAGNOSIS — Z3042 Encounter for surveillance of injectable contraceptive: Secondary | ICD-10-CM | POA: Diagnosis not present

## 2023-11-15 MED ORDER — MEDROXYPROGESTERONE ACETATE 150 MG/ML IM SUSP
150.0000 mg | Freq: Once | INTRAMUSCULAR | Status: AC
  Administered 2023-11-15: 150 mg via INTRAMUSCULAR

## 2023-11-15 NOTE — Progress Notes (Signed)
Pt here for Depo Provera injection only.  She does provide her own medication.  Injection given IM

## 2023-12-09 ENCOUNTER — Other Ambulatory Visit: Payer: Self-pay

## 2023-12-09 ENCOUNTER — Ambulatory Visit (INDEPENDENT_AMBULATORY_CARE_PROVIDER_SITE_OTHER): Payer: 59 | Admitting: Orthopaedic Surgery

## 2023-12-09 DIAGNOSIS — M7541 Impingement syndrome of right shoulder: Secondary | ICD-10-CM

## 2023-12-09 DIAGNOSIS — M24611 Ankylosis, right shoulder: Secondary | ICD-10-CM | POA: Diagnosis not present

## 2023-12-09 MED ORDER — BUPIVACAINE HCL 0.25 % IJ SOLN
2.0000 mL | INTRAMUSCULAR | Status: AC | PRN
  Administered 2023-12-09: 2 mL via INTRA_ARTICULAR

## 2023-12-09 MED ORDER — LIDOCAINE HCL 1 % IJ SOLN
2.0000 mL | INTRAMUSCULAR | Status: AC | PRN
  Administered 2023-12-09: 2 mL

## 2023-12-09 MED ORDER — METHYLPREDNISOLONE ACETATE 80 MG/ML IJ SUSP
2.0000 mL | INTRAMUSCULAR | Status: AC | PRN
  Administered 2023-12-09: 2 mL via INTRA_ARTICULAR

## 2023-12-09 MED ORDER — KETOROLAC TROMETHAMINE 10 MG PO TABS: 10.0000 mg | ORAL_TABLET | Freq: Three times a day (TID) | ORAL | 0 refills | Status: DC

## 2023-12-09 NOTE — Progress Notes (Signed)
Office Visit Note   Patient: Connie Mills           Date of Birth: 1976-10-12           MRN: [id number] Visit Date: 12/09/2023              Requested by: Agapito Games, MD [address] [place],  [place] [address] PCP: Agapito Games, MD   Assessment & Plan: Visit Diagnoses:  1. Impingement syndrome of right shoulder   2. Arthrofibrosis of right shoulder     Plan: Patient symptoms appear to be related to frozen shoulder given decreased range of motion in 3 different planes as well as the sudden onset of patient's pain without any trauma.  X-rays appear appropriate and patient does not have any significant arthritis.  Will go ahead and do a glenohumeral injection under ultrasound today.  Patient tolerated procedure well without difficulties.  Did discuss with patient that she needs to start doing some early range of motion as well as some physical therapy as provided.  Patient was advised to follow-up in 2 weeks for reevaluation and can consider doing a repeat injection at that time for this issue.  Will also go ahead and send patient some oral anti-inflammatory medications to take in the short course for her pain.  Follow-Up Instructions: Return in about 2 years (around 12/08/2025).   Orders:  No orders of the defined types were placed in this encounter.  Meds ordered this encounter  Medications   ketorolac (TORADOL) 10 MG tablet    Sig: Take 1 tablet (10 mg total) by mouth every 8 (eight) hours.    Dispense:  10 tablet    Refill:  0      Procedures: Large Joint Inj: R glenohumeral on 12/09/2023 4:11 PM Details: 22 G 3.5 in needle, ultrasound-guided Medications: 2 mL lidocaine 1 %; 2 mL bupivacaine 0.25 %; 2 mL methylPREDNISolone acetate 80 MG/ML  US-guided glenohumeral joint injection, Right shoulder After discussion on risks/benefits/indications, informed verbal consent was obtained. A timeout was then performed. The patient was positioned  lying lateral recumbent on examination table. The patient's shoulder was prepped with betadine and multiple alcohol swabs and utilizing ultrasound guidance, the patient's glenohumeral joint was identified on ultrasound. Using ultrasound guidance a 22-gauge, 3.5 inch needle with a mixture of 2:2:2 cc's lidocaine:bupivicaine:depomedrol was directed from a lateral to medial direction via in-plane technique into the glenohumeral joint with visualization of appropriate spread of injectate into the joint. Patient tolerated the procedure well without immediate complications.      Consent was given by the patient.       Clinical Data: No additional findings.   Subjective: Chief Complaint  Patient presents with   Right Shoulder - Follow-up    Patient is presenting for follow-up of her right shoulder pain.  Patient states that after previous injection in August of last year she noticed significant relief of her right shoulder pain.  Patient states that her shoulder pain has slowly started to worsen and that last month she noticed she was having decreased range of motion again.  Patient states that the decreased range of motion continue to worsen and now she is having significant pain with any movement of the right shoulder.  Patient is right-handed dominant.  Patient states that she is unable to put her hand behind her back, lift her arm above 90 degrees or externally rotate her arm.  Patient otherwise has no other concerns at  this time, denies any numbness tingling of the upper extremities.    Review of Systems   Objective: Vital Signs: There were no vitals taken for this visit.  Physical Exam  Ortho Exam Inspection reveals no gross abnormalities right shoulder, there is some tenderness to palpation over the deltoid along the lateral aspect of the arm on the right side.  Range of motion of the shoulder is decreased in 3 different planes, internal rotation, external rotation as well as  abduction.  Patient has decreased strength due to pain.  Empty can is positive.  Hawkins positive Neer impingement is positive. Specialty Comments:  No specialty comments available.  Imaging: No results found.   PMFS History: Patient Active Problem List   Diagnosis Date Noted   CKD stage G3a/A2, GFR 45-59 and albumin creatinine ratio 30-299 mg/g (HCC) 09/16/2023   Primary osteoarthritis of right shoulder 06/28/2023   Elevated serum creatinine 02/22/2023   Adjustment disorder with depressed mood 08/14/2022   Chronic foot pain, right 05/05/2021   IFG (impaired fasting glucose) 09/15/2020   Frequent headaches 08/06/2020   Family history of thyroid disease 03/20/2016   Atypical squamous cells of undetermined significance (ASCUS) on Papanicolaou smear of cervix 09/15/2014   Recurrent oral herpes simplex 08/07/2014   Heart murmur 04/03/2014   Hypertension, essential, benign 12/05/2012   Anemia 02/01/2012   Past Medical History:  Diagnosis Date   Abnormal Pap smear of cervix 2005   Leep; normal pap after   AMA (advanced maternal age) multigravida 35+    Anemia    having chronic diarrhea since Sept 2021   Blood transfusion May 16, 1992   "busted blood vessel behind incision", led to bleeding.   Blood transfusion complicating pregnancy 1993   Hypertension    labetolol 200 bid   Irregular heart beat    Has been evaluated and is monitored.    Family History  Problem Relation Age of Onset   Hypertension Mother    Hypertension Brother    Diabetes Maternal Grandmother    Cancer Paternal Grandfather    Hypertension Sister    Thyroid disease Sister    Thyroid disease Maternal Aunt    Colon cancer Maternal Grandfather 102   Other Neg Hx    Colon polyps Neg Hx    Esophageal cancer Neg Hx    Rectal cancer Neg Hx    Stomach cancer Neg Hx     Past Surgical History:  Procedure Laterality Date   CERVICAL BIOPSY  W/ LOOP ELECTRODE EXCISION     CESAREAN SECTION  1993    Breech,  blood loss transfusion with "busted blood vessels behind incision"   WISDOM TOOTH EXTRACTION  2003   Social History   Occupational History   Occupation: Curator: [employer]  Tobacco Use   Smoking status: Never   Smokeless tobacco: Never  Vaping Use   Vaping status: Never Used  Substance and Sexual Activity   Alcohol use: No   Drug use: No   Sexual activity: Yes    Partners: Male    Birth control/protection: Injection    Comment: Depo

## 2023-12-30 ENCOUNTER — Ambulatory Visit: Payer: 59 | Admitting: Orthopaedic Surgery

## 2023-12-30 ENCOUNTER — Encounter: Payer: Self-pay | Admitting: Orthopaedic Surgery

## 2023-12-30 DIAGNOSIS — M7541 Impingement syndrome of right shoulder: Secondary | ICD-10-CM

## 2023-12-30 DIAGNOSIS — M24611 Ankylosis, right shoulder: Secondary | ICD-10-CM | POA: Diagnosis not present

## 2023-12-30 MED ORDER — METHYLPREDNISOLONE ACETATE 40 MG/ML IJ SUSP
40.0000 mg | INTRAMUSCULAR | Status: AC | PRN
  Administered 2023-12-30: 40 mg via INTRA_ARTICULAR

## 2023-12-30 MED ORDER — MELOXICAM 15 MG PO TABS: 15.0000 mg | ORAL_TABLET | Freq: Every day | ORAL | 1 refills | Status: DC | PRN

## 2023-12-30 MED ORDER — LIDOCAINE HCL 1 % IJ SOLN
3.0000 mL | INTRAMUSCULAR | Status: AC | PRN
  Administered 2023-12-30: 3 mL

## 2023-12-30 NOTE — Progress Notes (Signed)
The patient is well-known to me and she is only 47 years old and she has been dealing with significant impingement syndrome of her right shoulder.  A few weeks ago we did see her in the office and an ultrasound-guided intra-articular injection was placed in the shoulder joint on the right side.  She said that it helped somewhat and some of her motion is better but she still has a hard time reaching behind her.  On my exam her external rotation is limited by few degrees actively but passively I can get her to full external rotation.  Her abduction and forward flexion are good but her internal rotation with adduction is still to the lower lumbar spine and little less than that.  With her left side she has full and fluid motion of her shoulder in all planes.  We talked about treatment options.  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  She is going to continue to work on a home exercise program but I did recommend at least a steroid injection today in the subacromial outlet.  The last time we did that was in August of last year.  She agreed to this and tolerated it well.  I will send in some meloxicam which I think will be easier on her stomach and for blood pressure issues.  If things or not getting better she will reach out to [place] and let us know.  She knows to wait at least 3 months between steroid injections at the minimum.    Procedure Note  Patient: Connie Mills             Date of Birth: 01-Sep-1976           MRN: [id number]             Visit Date: 12/30/2023  Procedures: Visit Diagnoses:  1. Impingement syndrome of right shoulder   2. Arthrofibrosis of right shoulder     Large Joint Inj: R subacromial bursa on 12/30/2023 4:19 PM Indications: pain and diagnostic evaluation Details: 22 G 1.5 in needle  Arthrogram: No  Medications: 3 mL lidocaine 1 %; 40 mg methylPREDNISolone acetate 40 MG/ML Outcome:  tolerated well, no immediate complications Procedure, treatment alternatives, risks and benefits explained, specific risks discussed. Consent was given by the patient. Immediately prior to procedure a time out was called to verify the correct patient, procedure, equipment, support staff and site/side marked as required. Patient was prepped and draped in the usual sterile fashion.

## 2024-01-26 ENCOUNTER — Ambulatory Visit: Payer: Self-pay | Admitting: Family Medicine

## 2024-01-26 NOTE — Telephone Encounter (Signed)
 Chief Complaint: Flu  Symptoms: body aches, sneezing, chills, congestion, fever Frequency: since this morning Pertinent Negatives: Patient denies worsened shortness of breath Disposition: [] ED /[] Urgent Care (no appt availability in office) / [] Appointment(In office/virtual)/ []  [place] [facility]/ [x] Home Care/ [] Refused Recommended Disposition /[] [facility]/ []  Follow-up with PCP Additional Notes: Patient called to report she believes she has the flu after being exposed/caring for a family member. Patient's symptoms started this morning and she has a fever of 102.2, body aches, chills, congestion, and sneezing. Patient is not high risk. Patient states she does not have any worsened shortness of breath past her baseline that she has had since receiving Covid vaccine. Patient given information for [facility] Flu flier and advised to call back if symptoms worsen.    Copied from CRM [id number]. Topic: Clinical - Red Word Triage >> Jan 26, 2024  4:38 PM Shon Hale wrote: Red Word that prompted transfer to Nurse Triage: Patient exposed to flu via spouse. Patient is aching all over, fever, congestion, sneezing. Temp taken at 1pm read 102.2 degrees.  Has some shortness of breath Reason for Disposition  [1] Probable influenza (fever) with no complications AND [2] NOT HIGH RISK  Answer Assessment - Initial Assessment Questions 1. WORST SYMPTOM: "What is your worst symptom?" (e.g., cough, runny nose, muscle aches, headache, sore throat, fever)       Body aches  2. ONSET: "When did your flu symptoms start?"      9 am today 3. COUGH: "How bad is the cough?"       No 4. RESPIRATORY DISTRESS: "Describe your breathing."      Mild shortness of breath with congestion 5. FEVER: "Do you have a fever?" If Yes, ask: "What is your temperature, how was it measured, and when did it start?"     102.2  6. EXPOSURE: "Were you exposed to someone with influenza?"       Family member 7. FLU VACCINE: "Did  you get a flu shot this year?"     Yes 8. HIGH RISK DISEASE: "Do you have any chronic medical problems?" (e.g., heart or lung disease, asthma, weak immune system, or other HIGH RISK conditions)     High blood pressure, low iron 10. OTHER SYMPTOMS: "Do you have any other symptoms?"  (e.g., runny nose, muscle aches, headache, sore throat)       Mild shortness of breath, sneezing, body aches, fever, congestion, chills  Protocols used: Influenza (Flu) - [facility]

## 2024-02-07 ENCOUNTER — Telehealth: Payer: Self-pay | Admitting: *Deleted

## 2024-02-07 NOTE — Telephone Encounter (Signed)
 Returned call from 8:42 AM. Left patient a message to call and schedule annual and Depo.

## 2024-02-22 ENCOUNTER — Other Ambulatory Visit: Payer: Self-pay | Admitting: *Deleted

## 2024-02-22 DIAGNOSIS — I10 Essential (primary) hypertension: Secondary | ICD-10-CM

## 2024-02-22 MED ORDER — METOPROLOL SUCCINATE ER 100 MG PO TB24: ORAL_TABLET | ORAL | 3 refills | Status: AC

## 2024-02-22 MED ORDER — AMLODIPINE BESY-BENAZEPRIL HCL 10-40 MG PO CAPS: 1.0000 | ORAL_CAPSULE | Freq: Every day | ORAL | 3 refills | Status: AC

## 2024-02-23 ENCOUNTER — Telehealth: Payer: Self-pay | Admitting: *Deleted

## 2024-02-23 NOTE — Telephone Encounter (Signed)
 Returned call from 11:52 AM, phone lines forward at 11:30 AM for lunch. Left patient a message that the pharmacy needs to fax the office a refill/prior auth request to [phone].

## 2024-02-24 ENCOUNTER — Other Ambulatory Visit: Payer: Self-pay

## 2024-02-24 DIAGNOSIS — Z3042 Encounter for surveillance of injectable contraceptive: Secondary | ICD-10-CM

## 2024-02-24 MED ORDER — MEDROXYPROGESTERONE ACETATE 150 MG/ML IM SUSP
150.0000 mg | INTRAMUSCULAR | 4 refills | Status: AC
Start: 2024-02-24 — End: ?

## 2024-02-24 NOTE — Progress Notes (Signed)
 Called pharmacy x 2 for Pt's refill   1-02/23/24@3 :05pm  2- 02/24/24@11 :31am    Attempting to get Pt's Rx refilled .

## 2024-02-25 ENCOUNTER — Ambulatory Visit (INDEPENDENT_AMBULATORY_CARE_PROVIDER_SITE_OTHER)

## 2024-02-25 DIAGNOSIS — Z3042 Encounter for surveillance of injectable contraceptive: Secondary | ICD-10-CM

## 2024-02-25 MED ORDER — MEDROXYPROGESTERONE ACETATE 150 MG/ML IM SUSP
150.0000 mg | Freq: Once | INTRAMUSCULAR | Status: AC
  Administered 2024-02-25: 150 mg via INTRAMUSCULAR

## 2024-02-25 NOTE — Progress Notes (Signed)
 Date last pap: 11/30/22. Last Depo-Provera: 11/15/23. Side Effects if any: NA. Serum HCG indicated? NA. Depo-Provera 150 mg IM given by: Ladona Ridgel .

## 2024-03-06 NOTE — Progress Notes (Signed)
 Nephrology - Return Visit  Connie Mills DOB: [birth year] Visit Date: 03/06/2024   HISTORY OF PRESENT ILLNESS: Connie Mills is a 48 y.o. female with past medical history of notable for: Hypertension, impaired fasting glucose, history of anemia, herpes simplex, ASCUS, depression and beta thalassemia minor.    Review of serum creatinine values up until initial consultation reveal only sCr value in our system from 05/2014 of 0.66 when she presented to the ED at [facility] for chest pain and hypertension. Remainder of values come from [facility] through [facility] system which shows sCr 0.76 in 2012 and from 2013 thorugh 08/2021 sCr ranges from mid0.7-mid0.9's. In 02/2022 sCr noted to be 1.05, in 08/2022 sCr 1.2 and on 10/01/2022 (last labs available for review) an sCr of 1.41mg /dl with eGFR 53fo/fpw.    Urine studies up until initial consultation are reviewed. Last available UA is from 2013 and 2012 which were normal. She had a UACR from 09/2022 showing 230mg /g albuminuria. She completed a 24h urine protein collection with IFE showing 296mg  protein.    Review of imaging reveals a renal US  obtained 09/2022 with R 11.1 CM and L 10.3cm with 1.1cm simple cyst and no other abnormalities noted.    Additional pertinent lab evaluation:  - Negative sickle cell screen 12/2011  - non reactive RPR x3 2013  - negative Quantiferon gold 03/2019 - Underwent a colonscopy in 2021 for iron deficiency anemia which was normal, 10 year recall  - Normal CXR 2022  - A1c 5.7 and 5.8 in 08/2021 and 08/2020.  - ESR 110 and CRP 29.6 09/2022  - ANA, HIV, hepatitis C 09/2022 negative  - CBC 08/2022 with Hb 10.3, plts 393, MCV 74.   Initial consultation 11/2022:  sCr 1.14mg /dl, cystatin c 8.83 - combined CKD EPI eGFR 55ml/min  SPEP without m spike; Kappa/lambda both elevated with sFLC ratio 1.98.  Negative/normal: ANA by IFA, Anti-dsDNA, anti-smith, ANCA, urine culture. C3 elevated 260 and C4 56  FE 37, t sat 13%,  ferritin 336   0.8-0.9's mg/dl range until 03/7975 when sCr noted to be 1.05mg /dl, in 89/7976 1.2 and then repeat 09/2022 up to 1.41 which is concerning for steady progression/worsening of renal indices. Workup with PCP included a normal renal Ultrasound, UACR in moderate range with 24h urine collection showing 296mg  of total proteinuria which is relatively insignificant.  From a renal standpoint, I cannot rule out underlying primary glomerular disease at this time but she also has risk significant factors for AKI/protracted ATN process given her variable use of BP medications and suspected swings in BP, her poor fluid intake and her consistent use of NSAIDs over the last year and a half for her foot pain. AIN also remains potential with NSAIDs. There is potential for underlying CKD in setting of Obesity, Hypertension (which has been longstanding), hyperlipidemia   03/2023 follow up: reported consistent antihypertensive use, avoiding NSAIDs. sCr 1.14 11/2023 and 1.17 03/2023 visit. UPCR 166mg /g and UA without hematuria/proteinuria. She was scheduled for 6 month follow up.   The patient returns today for nephrology follow-up, having last been seen in May 2024.  The patient reports doing well in her usual state of health.  Interval labs/events:  - Patient rescheduled 09/2023 to today  - She has sCr of 1.62 08/2023 with PCP and repeat labs week later 09/2023 with sCr improving 1.47  - UACR 11mg /g 08/2023 ; A1c 6% 08/2023   Here today without acute complaints.  She has meloxicam  on her med list, reports taking  frequently but daily as prescribed. Reports this is for shoulder pain on right - seeing ortho impingement syndrome and arthofibrosis. We reviewed risks of NSAIDs and my recommendations in setting of HTN/CKD would be to avoid.  Reports generally doing well with daily water/fluid intake Denies difficulty voiding, dysuria or hematuria.  She is following with hematology. Diagnosed with beta thalassemia  minor and getting IVI as indicated  BP elevated in office today, and recent visits. She reports white coat. She tells me she did not take BP medications today and has been more inconsistent.  She reports that when she is taking her medications and monitors BP at home she has BP readings at goal ranges in 120-130/<80    ALLERGIES:  Allergies  Allergen Reactions  . Dilaudid [Hydromorphone Hcl] Itching  . Hydromorphone Hcl Itching  . Hydrochlorothiazide  Other    Doesn't feel well on it      MEDICATIONS:  Current Home Medications   Medication Sig  acetaminophen -codeine  (TYLENOL  #4) 300-60 mg per tablet Take one tablet by mouth every 8 (eight) hours as needed.  amlodipine -benazepril  (LOTREL) 10-40 MG per capsule Take one capsule by mouth daily.  chlorthalidone  (HYGROTEN) 25 mg tablet Take one tablet (25 mg dose) by mouth daily. As needed  Ferrous Sulfate  (IRON) 325 (65 Fe) MG TABS Take 1 tablet by mouth daily. Patient not taking: Reported on 03/06/2024  medroxyPROGESTERone  (DEPO-PROVERA ) 150 mg/mL injection Inject 1 mL (150 mg dose) into the muscle every 3 (three) months.  meloxicam  (MOBIC ) 15 mg tablet Take one tablet (15 mg dose) by mouth daily.  metoprolol  succinate (TOPROL -XL) 100 mg 24 hr tablet Take one tablet (100 mg dose) by mouth daily.  multivitamin plain (MULTIVITAMIN) TABS Take one tablet by mouth daily.  valacyclovir  (VALTREX ) 1000 mg tablet Take by mouth.  Vitamin D, Cholecalciferol, (VITAMIN D3) 400 UNITS tablet Take one tablet (400 Units dose) by mouth daily.     PHYSICAL EXAMINATION:  Blood pressure (!) 158/95, height 5' 7 (1.702 m), weight 225 lb (102.1 kg), not currently breastfeeding. Body mass index is 35.24 kg/m. GENERAL - Well appearing overweight female, in no acute distress. HEENT - Nose and oropharynx are clear without erythema or exudate. Mucous membranes are moist. EYES - Conjunctivae pink, sclerae anicteric.   NECK - Supple, without JVD.   PULMONARY -  Clear to auscultation bilaterally. No wheezes/rhonchi/rales.   CARDIOVASCULAR - Regular rate and rhythm, normal S1/S2 heart sounds.  No murmurs or rubs.  No pitting pretibial edema bilaterally.  GASTROINTESTINAL / BACK - The abdomen is soft, non-tender, non-distended.  Bowel sounds are normal.    INTEGUMENTARY - No jaundice.  No rash on exposed areas. MUSCULOSKELETAL - No joint deformities or synovitis. NEUROLOGIC - There are no focal motor deficits.  Cranial nerves are grossly intact.   PSYCHIATRIC - Normal eye contact and language, affect is appropriate.   LABS: Lab Results  Component Value Date/Time   Sodium 138 03/31/2023 11:21 AM   Lab Results  Component Value Date/Time   Potassium 3.7 03/31/2023 11:21 AM   Lab Results  Component Value Date/Time   Chloride 101 03/31/2023 11:21 AM   Lab Results  Component Value Date/Time   CO2 21 03/31/2023 11:21 AM   Lab Results  Component Value Date/Time   Glucose 87 03/31/2023 11:21 AM   Lab Results  Component Value Date/Time   BUN 18 03/31/2023 11:21 AM   Lab Results  Component Value Date/Time   Creatinine 1.17 (H) 03/31/2023 11:21 AM   Lab  Results  Component Value Date/Time   eGFR 58 (L) 03/31/2023 11:21 AM   Lab Results  Component Value Date/Time   Total Bilirubin 0.30 06/15/2014 11:25 AM   Lab Results  Component Value Date/Time   Alkaline Phosphatase 73 06/15/2014 11:25 AM   Lab Results  Component Value Date/Time   AST 15 06/15/2014 11:25 AM   Lab Results  Component Value Date/Time   ALT (SGPT) 10 06/15/2014 11:25 AM   Lab Results  Component Value Date/Time   Albumin, Serum 4.2 03/31/2023 11:21 AM   No results found for: HGBA1C Lab Results  Component Value Date/Time   CALCIUM 9.9 03/31/2023 11:21 AM   Lab Results  Component Value Date/Time   Phosphorus 2.5 (L) 03/31/2023 11:21 AM   No results found for: PARATHYHORM Lab Results  Component Value Date/Time   Vit D, 25-Hydroxy 28.1 (L) 03/31/2023  11:21 AM   Lab Results  Component Value Date/Time   WBC 7.7 11/30/2023 08:32 AM   Lab Results  Component Value Date/Time   HGB 10.2 (L) 11/30/2023 08:32 AM   Lab Results  Component Value Date/Time   Plt Ct 348 11/30/2023 08:32 AM   Lab Results  Component Value Date/Time   Ferritin 495.0 (H) 11/30/2023 08:32 AM   No results found for: SAT No results found for: MICROCRERAT No results found for: CREATPROT   Assessment & Plan:   # Chronic Kidney Disease, 3a  # Acute kidney injury   - baseline serum creatinine is 0.8-0.9's mg/dl range until 03/7975 when sCr noted to be 1.05mg /dl, in 89/7976 1.2 and then repeat 09/2022 up to 1.41 mg/dl. At our initial referral improving to 1.14mg /dl with cystatin c 8.83 and combined CKD Epi eGFR 37ml/min. We had follow up in 03/2023 with sCr stable 1.17mg /dl eGFR 41fo/fpw and favored to have CKD stage 3a. 24h urine protein (296mg ), with subsequent UACR/UPCR in normal ranges.  In the interim she appears to have had an AKI 08/2023 with sCr 1.62 and was improving to 1.47 on lab check week later through PCP.  She has variable antihypertensive adherence. She is back on NSAID therapy.  Hopefully we see improved renal indices today - The patient's renal disease is felt most likely secondary to small vessel vascular disease, HTN related nephrosclerosis, obesity with contribution of NSAID use and sequela of AKI.  Past work up as listed above.  - progression/proteinuria: she is on ACEi. Updating labs to calculate [facility]   - I have reviewed general methods of slowing further progression of kidney disease, including maintaining good control of hypertension (target BP < 120/80), diabetes (target A1c <= 7-8%), lipids (continue statin as CKD is CAD equivalent), smoking cessation, dietary sodium restriction, weight control and exercise, avoidance of potential nephrotoxins (NSAIDs, IV contrast, phosphate based bowel prep, etc), control of uric acid shows weak evidence  for slowing CKD progression and trying to avoid chronic PPI use given possible association with CKD progression (AIN and/or CIN). - the patient's medication list was reviewed for appropriate dosing based on renal function with changes as follows: N/A     # blood pressure / volume control  BP Readings from Last 3 Encounters:  03/06/24 (!) 158/95  11/30/23 138/90  10/02/23 (!) 166/92   - BP elevated, did not take medications today. In our 03/2023 we noted BP was much improved with consistent antihypertensive control. Discussed again the importance of this.    - Continue current regimen which includes Benazepril  for its antiproteinuric and renal protective effect, as well  as amlodipine , chlorthalidone  and metoprolol . - I advised the patient to hold the diuretic during any acute illness such as vomiting or severe diarrhea than may lead to dehydration.     # Electrolytes/acid base   - Electrolyte panel pending    # Bone and Mineral Metabolism   - I have sent for vitamin D, PTH, phosphorus, Calcium if indicated - Based on KDIGO guidelines, will monitor Ca/Po4 q6-70mo for CKD stage 3 or 4 and q1-70mo for CKD5 ; PTH q6-12 for CKD 3(based on baseline level) or 4 and q3-23mo for CKD5. Vitamin D levels will be checked as needed based on baseline level.  Expert opinion recommends PTH goal of 35-70 for CKD 3, 70-110 for CKD 4 and 150-300 for CKD5   - hyperphosphatemia goals <4.6 in CKD 3 and 4 or <5.5 for CKD5.  - Vit D def: on supplementation   - Current parameters acceptable for stage of CKD     # Hematology in the setting of CKD   - Anemia: following now with hematology, found to have beta thalassemia minor. Getting IVI as indicated. No indications for ESA   # pre-DM / Hyperlipidemia / obesity  - continue to strive for good glycemic control to prevent further renal insult; pre-diabetic management as per PCP  - statin per PCP  - recommend ongoing attempts for weight loss/daily exercise   #  nutrition  - Acceptable  - continue low sodium diet - information given to patient     # health maintenance   Immunization History  Administered Date(s) Administered  . DTaP (Infanrix) 09/04/1974, 11/26/1975, 12/24/1975, 06/29/1976, 04/04/1979  . DTaP UNSPECIFIED 09/04/1974, 11/26/1975, 12/24/1975, 06/29/1976, 04/04/1979  . Hepatitis B adolescent/pediatric 04/13/1997  . Hepatitis B, unspecified formulation 10/10/1996, 11/13/1996, 04/11/1997  . IPV 09/04/1974, 11/26/1975, 12/24/1975, 01/01/1978, 04/04/1979  . Influenza virus vaccine, unspecified formulation 10/13/2011, 08/07/2014, 09/04/2015, 09/16/2016  . Influenza, Injectable Quad (IIV4)(Afluria,Flulaval,Fluzone) 08/28/2017  . Influenza, injectable, MDCK, preservative free, quadrivalent(FlucelvaxPF) 10/27/2016  . Influenza, injectable, quadrivalent, preservative free (IIV4)(AfluriaPF,FluarixPF,FluzonePf,FlulavalPF) 08/07/2014, 10/27/2016, 09/24/2018, 08/06/2020  . Influenza, split virus, trivalent, PF (Afluria PF, FluLaval PF, Fluzone PF, Fluarix PF) 08/31/2015  . Influenza, split virus, trivalent, preservative (Afluria, Fluzone) 10/13/2011  . MMR 11/26/1975, 06/29/1976, 06/29/1977  . Measles 11/26/1975  . Mumps 06/29/1977  . POLIO, UNSPECIFIED FORMULATION 09/04/1974, 11/26/1975, 12/24/1975, 12/31/1977, 04/04/1979  . PPD Test 06/08/2020  . Pfizer COVID-19 Bivalent 12+yr 02/15/2020, 03/14/2020  . Tdap 01/21/1993, 03/24/2005, 03/26/2017      # Disposition:   Follow up in about 8 months (around 11/05/2024). or sooner PRN for symptoms or interim labs   Future Appointments  Date Time Provider Department Center  05/30/2024 11:00 AM Waddell Conroy, PA-C [facility]  11/06/2024 11:00 AM Fonda LELON Minister, DO [facility]     I look forward to continuing participating in this patient's care with you. Please do not hesitate to contact me directly with any questions.     I discussed the care plan as well as risks/benefits of the proposed  therapies, and the patient/caregiver demonstrated understanding of the plan.  Please be aware that the above note was dictated using voice recognition software. Incorrect transcription may occur inadvertently and not be corrected on review.     Orders Placed This Encounter  Procedures  . Urinalysis Complete with Microscopic  . Prot+CreatU (Random)  . Albumin/Creatinine Ratio, Random Urine  . Renal Function Panel  . PTH Intact  . Vitamin D 25 Hydroxy   Patient's Medications  New Prescriptions  No medications on file  Discontinued Medications   KETOROLAC  (TORADOL ) 10 MG TABLET    Take one tablet (10 mg dose) by mouth every 6 (six) hours as needed for up to 5 days.      Electronically signed by: FONDA LELON MINISTER, DO 03/06/2024 12:29 PM

## 2024-03-15 ENCOUNTER — Ambulatory Visit (INDEPENDENT_AMBULATORY_CARE_PROVIDER_SITE_OTHER): Payer: BC Managed Care – PPO | Admitting: Family Medicine

## 2024-03-15 ENCOUNTER — Encounter: Payer: Self-pay | Admitting: Family Medicine

## 2024-03-15 VITALS — BP 158/82 | HR 66 | Ht 67.0 in | Wt 225.0 lb

## 2024-03-15 DIAGNOSIS — R7301 Impaired fasting glucose: Secondary | ICD-10-CM | POA: Diagnosis not present

## 2024-03-15 DIAGNOSIS — E559 Vitamin D deficiency, unspecified: Secondary | ICD-10-CM | POA: Diagnosis not present

## 2024-03-15 DIAGNOSIS — N1831 Chronic kidney disease, stage 3a: Secondary | ICD-10-CM | POA: Diagnosis not present

## 2024-03-15 DIAGNOSIS — I10 Essential (primary) hypertension: Secondary | ICD-10-CM

## 2024-03-15 NOTE — Assessment & Plan Note (Addendum)
 Blood pressure is not well-controlled today.  She did just take her medication right before she got here.  I encouraged her to check her blood pressure at home over the next couple of weeks and let me know how it is doing I do really want to make sure that it is under good control we discussed how it can really impact renal function

## 2024-03-15 NOTE — Progress Notes (Signed)
 Established Patient Office Visit  Subjective  Patient ID: Connie Mills, female    DOB: Sep 29, 1976  Age: 48 y.o. MRN: [id number]  Chief Complaint  Patient presents with   Hypertension    HPI Hypertension- Pt denies chest pain, SOB, dizziness, or heart palpitations.  Taking meds as directed w/o problems.  Denies medication side effects.    Impaired fasting glucose-no increased thirst or urination. No symptoms consistent with hypoglycemia.  She also saw her nephrologist earlier this week and they did some repeat labs they called her back and recommended that they do a biopsy for further workup.  Vit D was low and they are starting her on Rx.     ROS    Objective:     BP (!) 158/82   Pulse 66   Ht 5\' 7"  (1.702 m)   Wt 225 lb (102.1 kg)   SpO2 100%   BMI 35.24 kg/m    Physical Exam Vitals and nursing note reviewed.  Constitutional:      Appearance: Normal appearance.  HENT:     Head: Normocephalic and atraumatic.  Eyes:     Conjunctiva/sclera: Conjunctivae normal.  Cardiovascular:     Rate and Rhythm: Normal rate and regular rhythm.  Pulmonary:     Effort: Pulmonary effort is normal.     Breath sounds: Normal breath sounds.  Skin:    General: Skin is warm and dry.  Neurological:     Mental Status: She is alert.  Psychiatric:        Mood and Affect: Mood normal.      No results found for any visits on 03/15/24.    The 10-year ASCVD risk score (Arnett DK, et al., 2019) is: 15.6%    Assessment & Plan:   Problem List Items Addressed This Visit       Cardiovascular and Mediastinum   Hypertension, essential, benign - Primary   Blood pressure is not well-controlled today.  She did just take her medication right before she got here.  I encouraged her to check her blood pressure at home over the next couple of weeks and let me know how it is doing I do really want to make sure that it is under good control we discussed how it can really impact renal  function       Relevant Orders   Hemoglobin A1c   CMP14+EGFR   TSH     Endocrine   IFG (impaired fasting glucose)   Lab Results  Component Value Date   HGBA1C 6.0 (H) 09/16/2023   Last A1c looked great at 6.0.  Plan to recheck today on labs..  Continue to work on healthy diet and regular exercise right now she is just diet controlled and not currently on medication      Relevant Orders   Hemoglobin A1c   CMP14+EGFR   TSH     Genitourinary   CKD stage G3a/A2, GFR 45-59 and albumin creatinine ratio 30-299 mg/g (HCC)   Recent labs at the kidney doctor's office showed a jump in the serum creatinine to 1.7 which is quite significant as well as significant proteinuria.  So they are planning on biopsy we did discuss this today and I really do think this is the next best step she has had a significant progression in renal function from 1.4 in the fall to 1.5 in January and now 1.7 at the nephrologist office.  They did note low vitamin D as well so she plans on starting a  supplement.      Relevant Orders   Hemoglobin A1c   CMP14+EGFR   TSH   Other Visit Diagnoses       Vitamin D deficiency          It does look like the vitamin D was sent to the pharmacy so just encouraged her to pick it up and if she has any problems let us  know [facility] happy to resend a new prescription if needed.  Recommend recheck vitamin D level in 8 to 12 weeks.  Return in about 6 months (around 09/14/2024) for Hypertension, Pre-diabetes.    Duaine German, MD

## 2024-03-15 NOTE — Assessment & Plan Note (Signed)
 Recent labs at the kidney doctor's office showed a jump in the serum creatinine to 1.7 which is quite significant as well as significant proteinuria.  So they are planning on biopsy we did discuss this today and I really do think this is the next best step she has had a significant progression in renal function from 1.4 in the fall to 1.5 in January and now 1.7 at the nephrologist office.  They did note low vitamin D as well so she plans on starting a supplement.

## 2024-03-15 NOTE — Assessment & Plan Note (Signed)
 Lab Results  Component Value Date   HGBA1C 6.0 (H) 09/16/2023   Last A1c looked great at 6.0.  Plan to recheck today on labs..  Continue to work on healthy diet and regular exercise right now she is just diet controlled and not currently on medication

## 2024-03-16 ENCOUNTER — Encounter: Payer: Self-pay | Admitting: Family Medicine

## 2024-03-16 LAB — CMP14+EGFR
ALT: 10 IU/L (ref 0–32)
AST: 12 IU/L (ref 0–40)
Albumin: 4 g/dL (ref 3.9–4.9)
Alkaline Phosphatase: 71 IU/L (ref 44–121)
BUN/Creatinine Ratio: 13 (ref 9–23)
BUN: 25 mg/dL — ABNORMAL HIGH (ref 6–24)
Bilirubin Total: <0.2 mg/dL (ref 0.0–1.2)
CO2: 19 mmol/L — ABNORMAL LOW (ref 20–29)
Calcium: 9.6 mg/dL (ref 8.7–10.2)
Chloride: 106 mmol/L (ref 96–106)
Creatinine, Ser: 1.9 mg/dL — ABNORMAL HIGH (ref 0.57–1.00)
Globulin, Total: 3.1 g/dL (ref 1.5–4.5)
Glucose: 93 mg/dL (ref 70–99)
Potassium: 3.9 mmol/L (ref 3.5–5.2)
Sodium: 140 mmol/L (ref 134–144)
Total Protein: 7.1 g/dL (ref 6.0–8.5)
eGFR: 32 mL/min/{1.73_m2} — ABNORMAL LOW (ref >59)

## 2024-03-16 LAB — TSH: TSH: 1.58 u[IU]/mL (ref 0.450–4.500)

## 2024-03-16 LAB — HEMOGLOBIN A1C
Est. average glucose Bld gHb Est-mCnc: 128 mg/dL
Hgb A1c MFr Bld: 6.1 % — ABNORMAL HIGH (ref 4.8–5.6)

## 2024-03-16 NOTE — Progress Notes (Signed)
 HI Connie Mills,  Your A1c is 6.1 so pretty stable over the last year.  Kidney function jumped.  The repeat kidney function is around 1.9 so it does confirm what the nephrologist was seeing.  Your thyroid looks great.

## 2024-03-20 ENCOUNTER — Encounter: Payer: Self-pay | Admitting: Family Medicine

## 2024-04-14 ENCOUNTER — Encounter: Payer: Self-pay | Admitting: Certified Nurse Midwife

## 2024-04-14 ENCOUNTER — Ambulatory Visit (INDEPENDENT_AMBULATORY_CARE_PROVIDER_SITE_OTHER): Admitting: Certified Nurse Midwife

## 2024-04-14 ENCOUNTER — Other Ambulatory Visit (HOSPITAL_COMMUNITY)
Admission: RE | Admit: 2024-04-14 | Discharge: 2024-04-14 | Disposition: A | Discharge status: Home / Self Care | Source: Ambulatory Visit | Attending: Certified Nurse Midwife | Admitting: Certified Nurse Midwife

## 2024-04-14 VITALS — Ht 67.0 in | Wt 220.0 lb

## 2024-04-14 DIAGNOSIS — R85610 Atypical squamous cells of undetermined significance on cytologic smear of anus (ASC-US): Secondary | ICD-10-CM | POA: Insufficient documentation

## 2024-04-14 DIAGNOSIS — I10 Essential (primary) hypertension: Secondary | ICD-10-CM | POA: Insufficient documentation

## 2024-04-14 DIAGNOSIS — Z3042 Encounter for surveillance of injectable contraceptive: Secondary | ICD-10-CM

## 2024-04-14 DIAGNOSIS — Z01419 Encounter for gynecological examination (general) (routine) without abnormal findings: Secondary | ICD-10-CM | POA: Diagnosis present

## 2024-04-14 NOTE — Progress Notes (Signed)
 ANNUAL EXAM Patient name: Connie Mills MRN [id number]  Date of birth: [birth year] Chief Complaint:   Gynecologic Exam  History of Present Illness:   Connie Mills is a 48 y.o. G49P2012 African-American female being seen today for a routine annual exam.  Current complaints: None  No LMP recorded. Patient has had an injection. And Continues to use Depo as her method of contraception   Last pap 11/30/2022. Results were: Atypical glandular cells w/ HRHPV negative. H/O abnormal pap: yes , however normal prior to previous testing,  Last mammogram: 01/20/2023. Results were: normal.  Last colonoscopy: 2023. Results were: normal. Family h/o colorectal cancer: no     03/15/2024    8:18 AM 02/22/2023   10:20 AM 02/06/2022    3:54 PM 02/05/2021    1:26 PM 05/06/2020    2:19 PM  Depression screen PHQ 2/9  Decreased Interest 0 0 0 1 1  Down, Depressed, Hopeless 0 0 0 1 1  PHQ - 2 Score 0 0 0 2 2  Altered sleeping    3 1  Tired, decreased energy    1 1  Change in appetite    0 0  Feeling bad or failure about yourself     0 0  Trouble concentrating    0 1  Moving slowly or fidgety/restless    1 0  Suicidal thoughts    0 0  PHQ-9 Score    7 5  Difficult doing work/chores    Not difficult at all Not difficult at all        02/05/2021    1:25 PM 05/06/2020    2:20 PM 01/25/2018    4:29 PM  GAD 7 : Generalized Anxiety Score  Nervous, Anxious, on Edge 2 1 0  Control/stop worrying 1 0 0  Worry too much - different things 0 0 1  Trouble relaxing 3 1 0  Restless 1 1 0  Easily annoyed or irritable 0 1 0  Afraid - awful might happen 0 0 0  Total GAD 7 Score 7 4 1   Anxiety Difficulty Not difficult at all Somewhat difficult Not difficult at all     Review of Systems:   Pertinent items are noted in HPI Denies any headaches, blurred vision, fatigue, shortness of breath, chest pain, abdominal pain, abnormal vaginal discharge/itching/odor/irritation, problems with periods, bowel movements, urination,  or intercourse unless otherwise stated above. Pertinent History Reviewed:  Reviewed past medical,surgical, social and family history.  Reviewed problem list, medications and allergies. Physical Assessment:   Vitals:   04/14/24 1042  Weight: 220 lb (99.8 kg)  Height: 5\' 7"  (1.702 m)  Body mass index is 34.46 kg/m.        Physical Examination:   General appearance - well appearing, and in no distress  Mental status - alert, oriented to person, place, and time  Psych:  She has a normal mood and affect  Skin - warm and dry, normal color, no suspicious lesions noted  Chest - effort normal, all lung fields clear to auscultation bilaterally  Heart - normal rate and regular rhythm  Neck:  midline trachea, no thyromegaly or nodules  Breasts - breasts appear normal, no suspicious masses, no skin or nipple changes or  axillary nodes  Abdomen - soft, nontender, nondistended, no masses or organomegaly  Pelvic - VULVA: normal appearing vulva with no masses, tenderness or lesions  VAGINA: normal appearing vagina with normal color and discharge, no lesions  CERVIX: normal appearing cervix without discharge  or lesions, no CMT  Thin prep pap is done  HR HPV cotesting if abnormal  UTERUS: uterus is felt to be normal size, shape, consistency and nontender   ADNEXA: No adnexal masses or tenderness noted.  Extremities:  No swelling or varicosities noted  Chaperone present for exam  No results found for this or any previous visit (from the past 24 hours).  Assessment & Plan:  1) Well-Woman Exam - Overall doing well.  - Reports continued life stressors, but reports doing better.  - Recommendation to schedule a Mammogram within the next 3-6 months  - Repeat PAP today   2) Contraception  - Reviewed low likelihood of Pregnancy or Menstrual period given age Discussed Menopausal symptoms and a trial run of stopping Depo. Also offered alternative options of contraception.  - Patient counseled on Bone  Density and long term use of Depovera, and after discussion patient desires to continue with use, with the possibility of stopping in the next year.    2) Hypertension  - Patient currently on Amlodipine  and is being managed by her PCP   Labs/procedures today: PAP  Mammogram: schedule screening mammo as soon as possible, or sooner if problems Colonoscopy: in 10 years , or sooner if problems   Follow-up: Return in about 1 year (around 04/14/2025) for [place].  Corie Diamond, CNM 04/14/2024 12:32 PM

## 2024-04-19 LAB — CYTOLOGY - PAP
Comment: NEGATIVE
Diagnosis: UNDETERMINED — AB
High risk HPV: NEGATIVE

## 2024-04-20 ENCOUNTER — Ambulatory Visit: Payer: Self-pay | Admitting: Certified Nurse Midwife

## 2024-05-22 ENCOUNTER — Ambulatory Visit

## 2024-05-24 ENCOUNTER — Ambulatory Visit (INDEPENDENT_AMBULATORY_CARE_PROVIDER_SITE_OTHER)

## 2024-05-24 VITALS — BP 156/89 | HR 64 | Ht 67.0 in | Wt 222.1 lb

## 2024-05-24 DIAGNOSIS — Z3042 Encounter for surveillance of injectable contraceptive: Secondary | ICD-10-CM | POA: Diagnosis not present

## 2024-05-24 MED ORDER — MEDROXYPROGESTERONE ACETATE 150 MG/ML IM SUSP
150.0000 mg | Freq: Once | INTRAMUSCULAR | Status: AC
  Administered 2024-05-24: 150 mg via INTRAMUSCULAR

## 2024-05-24 NOTE — Progress Notes (Signed)
 Date last GYN exam: 04/14/2024. Last Depo-Provera : 02/25/2024. Side Effects if any: none reported. Serum HCG indicated? no. Depo-Provera  150 mg IM left Deltoid given by: Delon Maiden, CMA. Next appointment due 3 months.   Silvano LELON Piano, RN

## 2024-05-26 ENCOUNTER — Other Ambulatory Visit: Payer: Self-pay | Admitting: Obstetrics & Gynecology

## 2024-05-26 DIAGNOSIS — Z1231 Encounter for screening mammogram for malignant neoplasm of breast: Secondary | ICD-10-CM

## 2024-06-09 ENCOUNTER — Ambulatory Visit
Admission: RE | Admit: 2024-06-09 | Discharge: 2024-06-09 | Disposition: A | Discharge status: Home / Self Care | Source: Ambulatory Visit | Attending: Obstetrics & Gynecology | Admitting: Obstetrics & Gynecology

## 2024-06-09 DIAGNOSIS — Z1231 Encounter for screening mammogram for malignant neoplasm of breast: Secondary | ICD-10-CM

## 2024-06-15 ENCOUNTER — Other Ambulatory Visit: Payer: Self-pay | Admitting: Obstetrics & Gynecology

## 2024-06-15 DIAGNOSIS — R928 Other abnormal and inconclusive findings on diagnostic imaging of breast: Secondary | ICD-10-CM

## 2024-06-21 ENCOUNTER — Ambulatory Visit: Payer: Self-pay | Admitting: Obstetrics and Gynecology

## 2024-06-30 ENCOUNTER — Ambulatory Visit
Admission: RE | Admit: 2024-06-30 | Discharge: 2024-06-30 | Disposition: A | Discharge status: Home / Self Care | Source: Ambulatory Visit | Attending: Obstetrics & Gynecology | Admitting: Obstetrics & Gynecology

## 2024-06-30 ENCOUNTER — Ambulatory Visit

## 2024-06-30 DIAGNOSIS — R928 Other abnormal and inconclusive findings on diagnostic imaging of breast: Secondary | ICD-10-CM

## 2024-07-03 ENCOUNTER — Ambulatory Visit: Payer: Self-pay | Admitting: Obstetrics & Gynecology

## 2024-07-18 ENCOUNTER — Encounter: Payer: Self-pay | Admitting: Sports Medicine

## 2024-08-16 ENCOUNTER — Ambulatory Visit

## 2024-08-21 ENCOUNTER — Ambulatory Visit

## 2024-08-21 VITALS — BP 133/73 | HR 61 | Ht 67.0 in | Wt 211.0 lb

## 2024-08-21 DIAGNOSIS — Z3042 Encounter for surveillance of injectable contraceptive: Secondary | ICD-10-CM

## 2024-08-21 MED ORDER — MEDROXYPROGESTERONE ACETATE 150 MG/ML IM SUSP
150.0000 mg | Freq: Once | INTRAMUSCULAR | Status: AC
  Administered 2024-08-21: 150 mg via INTRAMUSCULAR

## 2024-08-21 NOTE — Progress Notes (Signed)
 Date last pap: 04/14/2024. Last Depo-Provera : 05/24/2024. Side Effects if any: none reported. UPT not indicated. Depo-Provera  150 mg IM given by: Delon Maiden, CMA in Right Deltoid. Next appointment due 3 months.   Silvano LELON Piano, RN

## 2024-08-30 ENCOUNTER — Ambulatory Visit: Payer: Self-pay

## 2024-08-30 NOTE — Telephone Encounter (Signed)
 FYI Only or Action Required?: FYI only for provider.  Patient was last seen in primary care on 03/15/2024 by Alvan Dorothyann BIRCH, MD.  Called Nurse Triage reporting Dizziness.  Symptoms began several days ago.  Interventions attempted: Rest, hydration, or home remedies.  Symptoms are: gradually worsening.  Triage Disposition: Go to ED Now (Notify PCP)  Patient/caregiver understands and will follow disposition?: Yes   Copied from CRM [id number]. Topic: Clinical - Red Word Triage >> Aug 30, 2024  1:10 PM Carla L wrote: Red Word that prompted transfer to Nurse Triage: patient feeling disoriented, dizziness, off balance Reason for Disposition  SEVERE dizziness (vertigo) (e.g., unable to walk without assistance)  Answer Assessment - Initial Assessment Questions Additional info: Received Aranesp Injection on 08/22/24 for anemia.     1. DESCRIPTION: Describe your dizziness.     Dizzy unable to describe off not right  2. VERTIGO: Do you feel like either you or the room is spinning or tilting?      Off balance, walking wobbly. Needs to hold onto wall 3. LIGHTHEADED: Do you feel lightheaded? (e.g., somewhat faint, woozy, weak upon standing)     Off balance  4. SEVERITY: How bad is it?  Can you walk?     Severe-cannot walk without assistance. Yesterday she walked into a wall.  5. ONSET:  When did the dizziness begin?     Monday  6. AGGRAVATING FACTORS: Does anything make it worse? (e.g., standing, change in head position)     unsure 7. CAUSE: What do you think is causing the dizziness?     unsure 8. RECURRENT SYMPTOM: Have you had dizziness before? If Yes, ask: When was the last time? What happened that time?     1 episode 3 weeks ago 9. OTHER SYMPTOMS: Do you have any other symptoms? (e.g., earache, headache, numbness, tinnitus, vomiting, weakness)     Tearful on the call. Feels disoriented but unable to explain.  Protocols used: Dizziness -  Vertigo-A-AH

## 2024-08-31 NOTE — Telephone Encounter (Signed)
 Duplicate. See nurse triage yesterday for updated notes.    Copied from CRM [id number]. Topic: Clinical - Red Word Triage >> Aug 31, 2024  8:49 AM Larissa RAMAN wrote: [facility] that prompted transfer to Nurse Triage: dizziness, off balanceThis encounter was created in error - please disregard.

## 2024-08-31 NOTE — Telephone Encounter (Addendum)
 Patient calling back and states she was seen at [facility] ED, states she had labs, chest Xray, CT, MRI of her head. She states she was diagnosed with vertigo. She states she was only given 1 pill for last night. She was told to follow up with PCP and was told her MRI was abnormal and to follow up with a neurologist (referral given in ED). She states she will be calling neurology later today to set up an appointment. Given work note til Monday. RN scheduled patient for HFU next week. Patient is asking if PCP will send her meclizine, she states she thinks it was 12.5mg .

## 2024-09-01 ENCOUNTER — Other Ambulatory Visit: Payer: Self-pay | Admitting: Family Medicine

## 2024-09-01 MED ORDER — MECLIZINE HCL 25 MG PO TABS: 25.0000 mg | ORAL_TABLET | Freq: Three times a day (TID) | ORAL | 0 refills | Status: AC | PRN

## 2024-09-01 NOTE — Telephone Encounter (Signed)
 OK send in rx.

## 2024-09-01 NOTE — Telephone Encounter (Signed)
 This task has been completed as requested. Patient notified of the update. No other inquiries during the call. No further action needed.

## 2024-09-01 NOTE — Progress Notes (Signed)
Meds ordered this encounter  Medications   meclizine (ANTIVERT) 25 MG tablet    Sig: Take 1 tablet (25 mg total) by mouth 3 (three) times daily as needed for dizziness.    Dispense:  30 tablet    Refill:  0    

## 2024-09-06 ENCOUNTER — Inpatient Hospital Stay: Admitting: Family Medicine

## 2024-09-07 ENCOUNTER — Ambulatory Visit: Admitting: Family Medicine

## 2024-09-07 VITALS — BP 130/82 | HR 59 | Ht 67.0 in | Wt 202.0 lb

## 2024-09-07 DIAGNOSIS — N1831 Chronic kidney disease, stage 3a: Secondary | ICD-10-CM

## 2024-09-07 DIAGNOSIS — N032 Chronic nephritic syndrome with diffuse membranous glomerulonephritis: Secondary | ICD-10-CM

## 2024-09-07 DIAGNOSIS — R42 Dizziness and giddiness: Secondary | ICD-10-CM

## 2024-09-07 DIAGNOSIS — I10 Essential (primary) hypertension: Secondary | ICD-10-CM

## 2024-09-07 DIAGNOSIS — G939 Disorder of brain, unspecified: Secondary | ICD-10-CM

## 2024-09-07 DIAGNOSIS — R7301 Impaired fasting glucose: Secondary | ICD-10-CM

## 2024-09-07 NOTE — Progress Notes (Signed)
 "  Established Patient Office Visit  Patient ID: Connie Mills, female    DOB: 04-Jan-1976  Age: 48 y.o. MRN: [id number] PCP: Alvan Dorothyann BIRCH, MD  Chief Complaint  Patient presents with   Follow-up    Subjective:     HPI  Discussed the use of AI scribe software for clinical note transcription with the patient, who gave verbal consent to proceed.  History of Present Illness Connie Mills is a 48 year old female with secondary FSGS and early diabetic changes who presents with generalized weakness and difficulty walking.  Generalized weakness and gait disturbance - Generalized weakness and difficulty walking for the past two weeks - Sensation of being off balance - Four episodes of near-syncope last week, including one at work requiring support from a wall - No chest pain, shortness of breath, or palpitations reported  Vertigo and neurological evaluation - Diagnosed with vertigo in the emergency department on October 15th - MRI showed partially empty sella turcica and chronic white matter changes - No evidence of intracranial hemorrhage or stroke on imaging  Anemia - Hemoglobin 9.9 in the emergency department, improved from 8.8 in August - Received iron infusion approximately three weeks ago - Hemoglobin has improved since iron infusion  Renal dysfunction and proteinuria - History of secondary FSGS and early diabetic changes - Renal biopsy performed earlier this year - Serum creatinine 2.3 in the emergency department, increased from previous 1.9 - Frequent urination without dysuria - Drinks 64 ounces of water daily - Significant lifestyle changes including 40-pound weight loss and dietary modifications - Avoids NSAIDs and is cautious with Tylenol  use  Blood pressure variability and antihypertensive management - Blood pressure in the emergency department was 199/unknown - Home blood pressure readings typically around 118/70 - Takes spironolactone, which was adjusted  due to dizziness - Monitors blood pressure regularly     ROS    Objective:     BP 130/82   Pulse (!) 59   Ht 5' 7 (1.702 m)   Wt 202 lb (91.6 kg)   SpO2 100%   BMI 31.64 kg/m    Physical Exam Vitals and nursing note reviewed.  Constitutional:      Appearance: Normal appearance.  HENT:     Head: Normocephalic and atraumatic.  Eyes:     Conjunctiva/sclera: Conjunctivae normal.  Cardiovascular:     Rate and Rhythm: Normal rate and regular rhythm.  Pulmonary:     Effort: Pulmonary effort is normal.     Breath sounds: Normal breath sounds.  Skin:    General: Skin is warm and dry.  Neurological:     Mental Status: She is alert.  Psychiatric:        Mood and Affect: Mood normal.      Results for orders placed or performed in visit on 09/07/24  Basic Metabolic Panel (BMET)  Result Value Ref Range   Glucose 76 70 - 99 mg/dL   BUN 28 (H) 6 - 24 mg/dL   Creatinine, Ser 8.26 (H) 0.57 - 1.00 mg/dL   eGFR 36 (L) >40 fO/fpw/8.26   BUN/Creatinine Ratio 16 9 - 23   Sodium 138 134 - 144 mmol/L   Potassium 4.3 3.5 - 5.2 mmol/L   Chloride 104 96 - 106 mmol/L   CO2 16 (L) 20 - 29 mmol/L   Calcium 10.0 8.7 - 10.2 mg/dL  Hemoglobin J8r  Result Value Ref Range   Hgb A1c MFr Bld 6.0 (H) 4.8 - 5.6 %   Est. average  glucose Bld gHb Est-mCnc 126 mg/dL  Urine Microalbumin w/creat. ratio  Result Value Ref Range   Creatinine, Urine 115.5 Not Estab. mg/dL   Microalbumin, Urine 32.2 Not Estab. ug/mL   Microalb/Creat Ratio 59 (H) 0 - 29 mg/g creat      The 10-year ASCVD risk score (Arnett DK, et al., 2019) is: 7.4%    Assessment & Plan:   Problem List Items Addressed This Visit       Cardiovascular and Mediastinum   Hypertension, essential, benign   Hypertension Hypertension with recent elevated readings. Chronic white matter changes on MRI suggest long-standing hypertension. Emphasized importance of blood pressure control. - Continue current dose of spironolactone. -  Monitor blood pressure regularly. - Ensure blood pressure control.      Relevant Orders   Basic Metabolic Panel (BMET) (Completed)   Hemoglobin A1c (Completed)   Urine Microalbumin w/creat. ratio (Completed)     Endocrine   IFG (impaired fasting glucose)   Prediabetes Prediabetes with potential benefit from Farxiga  for blood sugar control and renal protection. Significant lifestyle changes made. - Consider Farxiga  for renal protection and blood sugar control. - Continue lifestyle modifications.        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 Serum creatinine increased to 2.3. Discussed Farxiga  for renal protection and prediabetes management, with risks of urinary tract infections. - Recheck serum creatinine and EGFR. - Consider Farxiga  pending nephrologist's input. - Continue lifestyle modifications. - Avoid NSAIDs.      Relevant Orders   Basic Metabolic Panel (BMET) (Completed)   Hemoglobin A1c (Completed)   Urine Microalbumin w/creat. ratio (Completed)     Other   Vertigo   Chronic non-specific white matter lesions on MRI    Assessment and Plan Assessment & Plan  Anemia, likely iron deficiency Anemia likely due to iron deficiency with improved hemoglobin from 8.8 to 9.9 after iron infusion. Symptoms improved. - Monitor hemoglobin levels.  Chronic white matter ischemic changes of brain Chronic white matter ischemic changes on MRI likely due to long-standing hypertension. No acute intracranial events. - Follow up with neurologist. - Ensure strict blood pressure control.  Vertigo, resolved Recent episode of vertigo resolved. No  acute intracranial pathology on MRI.      Return in about 6 months (around 03/08/2025), or if symptoms worsen or fail to improve, for bp/a1c.    [name], MD [facility] at [facility]   "

## 2024-09-07 NOTE — Progress Notes (Unsigned)
 Pt was seen at [facility] for dizziness, elevated serum creatine  She has a f/u appointment with Neurology next week, she hasn't scheduled her appointment with nephrology at this time.

## 2024-09-08 ENCOUNTER — Ambulatory Visit: Payer: Self-pay | Admitting: Family Medicine

## 2024-09-08 ENCOUNTER — Encounter: Payer: Self-pay | Admitting: Family Medicine

## 2024-09-08 DIAGNOSIS — N032 Chronic nephritic syndrome with diffuse membranous glomerulonephritis: Secondary | ICD-10-CM | POA: Insufficient documentation

## 2024-09-08 DIAGNOSIS — G939 Disorder of brain, unspecified: Secondary | ICD-10-CM | POA: Insufficient documentation

## 2024-09-08 DIAGNOSIS — R42 Dizziness and giddiness: Secondary | ICD-10-CM | POA: Insufficient documentation

## 2024-09-08 LAB — MICROALBUMIN / CREATININE URINE RATIO
Creatinine, Urine: 115.5 mg/dL
Microalb/Creat Ratio: 59 mg/g{creat} — ABNORMAL HIGH (ref 0–29)
Microalbumin, Urine: 67.7 ug/mL

## 2024-09-08 LAB — BASIC METABOLIC PANEL WITH GFR
BUN/Creatinine Ratio: 16 (ref 9–23)
BUN: 28 mg/dL — ABNORMAL HIGH (ref 6–24)
CO2: 16 mmol/L — ABNORMAL LOW (ref 20–29)
Calcium: 10 mg/dL (ref 8.7–10.2)
Chloride: 104 mmol/L (ref 96–106)
Creatinine, Ser: 1.73 mg/dL — ABNORMAL HIGH (ref 0.57–1.00)
Glucose: 76 mg/dL (ref 70–99)
Potassium: 4.3 mmol/L (ref 3.5–5.2)
Sodium: 138 mmol/L (ref 134–144)
eGFR: 36 mL/min/1.73 — ABNORMAL LOW (ref >59)

## 2024-09-08 LAB — HEMOGLOBIN A1C
Est. average glucose Bld gHb Est-mCnc: 126 mg/dL
Hgb A1c MFr Bld: 6 % — ABNORMAL HIGH (ref 4.8–5.6)

## 2024-09-08 NOTE — Assessment & Plan Note (Signed)
 Hypertension Hypertension with recent elevated readings. Chronic white matter changes on MRI suggest long-standing hypertension. Emphasized importance of blood pressure control. - Continue current dose of spironolactone. - Monitor blood pressure regularly. - Ensure blood pressure control.

## 2024-09-08 NOTE — Progress Notes (Signed)
 HI Connie Mills,  Kidney function looks a little better at 1.7, which is awesome I know it had jumped up to 2.3 in the emergency room.  And I think 1.9 was the last when you had before the ED visit so it looks great.  I do think some of the changes that you have made have been helpful. A1C is 6.0 still in the prediabetes range.   You are spilling a little extra protein in the urine it was elevated but then a year ago it came back down into the normal range but now it is up just just slightly.  We will keep an eye on that.  It may have just been from that recent stress on the kidneys.  I think we should start the Farxiga that we chatted about and that the kidney doctor had mentioned previously.  If you are okay with that I can send over the starter dose.

## 2024-09-08 NOTE — Assessment & Plan Note (Signed)
 Prediabetes Prediabetes with potential benefit from Farxiga for blood sugar control and renal protection. Significant lifestyle changes made. - Consider Farxiga for renal protection and blood sugar control. - Continue lifestyle modifications.

## 2024-09-08 NOTE — Assessment & Plan Note (Signed)
 Chronic kidney disease with secondary FSGS and early diabetic changes Chronic kidney disease with secondary FSGS and early diabetic changes, likely due to prior uncontrolled hypertension and NSAID use. Serum creatinine increased to 2.3. Discussed Doreen for renal protection and prediabetes management, with risks of urinary tract infections. - Recheck serum creatinine and EGFR. - Consider Farxiga pending nephrologist's input. - Continue lifestyle modifications. - Avoid NSAIDs.

## 2024-09-13 ENCOUNTER — Encounter: Payer: Self-pay | Admitting: Family Medicine

## 2024-09-13 DIAGNOSIS — N1831 Chronic kidney disease, stage 3a: Secondary | ICD-10-CM

## 2024-09-13 DIAGNOSIS — I10 Essential (primary) hypertension: Secondary | ICD-10-CM

## 2024-09-13 MED ORDER — DAPAGLIFLOZIN PROPANEDIOL 5 MG PO TABS
5.0000 mg | ORAL_TABLET | Freq: Every day | ORAL | 3 refills | Status: AC
Start: 2024-09-13 — End: ?

## 2024-09-13 NOTE — Telephone Encounter (Signed)
 Orders Placed This Encounter  Procedures   Basic Metabolic Panel (BMET)    Meds ordered this encounter  Medications   dapagliflozin propanediol (FARXIGA) 5 MG TABS tablet    Sig: Take 1 tablet (5 mg total) by mouth daily.    Dispense:  90 tablet    Refill:  3

## 2024-09-14 ENCOUNTER — Ambulatory Visit: Admitting: Family Medicine

## 2024-09-18 ENCOUNTER — Encounter: Payer: Self-pay | Admitting: Radiology

## 2024-10-17 ENCOUNTER — Encounter: Payer: Self-pay | Admitting: Family Medicine

## 2024-10-30 ENCOUNTER — Other Ambulatory Visit: Payer: Self-pay | Admitting: *Deleted

## 2024-10-30 DIAGNOSIS — I10 Essential (primary) hypertension: Secondary | ICD-10-CM

## 2024-10-30 MED ORDER — CHLORTHALIDONE 25 MG PO TABS: 25.0000 mg | ORAL_TABLET | Freq: Every day | ORAL | 3 refills | Status: AC

## 2024-10-30 MED ORDER — VALACYCLOVIR HCL 1 G PO TABS: 1000.0000 mg | ORAL_TABLET | Freq: Every day | ORAL | 6 refills | Status: AC

## 2024-11-13 ENCOUNTER — Ambulatory Visit

## 2024-11-13 VITALS — BP 116/66 | HR 50 | Ht 67.0 in | Wt 205.0 lb

## 2024-11-13 DIAGNOSIS — Z3042 Encounter for surveillance of injectable contraceptive: Secondary | ICD-10-CM

## 2024-11-13 MED ORDER — MEDROXYPROGESTERONE ACETATE 150 MG/ML IM SUSP
150.0000 mg | Freq: Once | INTRAMUSCULAR | Status: AC
  Administered 2024-11-13: 150 mg via INTRAMUSCULAR

## 2024-11-13 NOTE — Progress Notes (Cosign Needed)
 Date last pap: 04/14/2024. Last Depo-Provera : 08/21/2024. Side Effects if any: none reported. UPT? Not indicated. Depo-Provera  150 mg IM given by: Silvano Piano, RN in Right Deltoid. Next appointment due 3 months.   Silvano LELON Piano, RN

## 2025-02-01 ENCOUNTER — Ambulatory Visit

## 2025-03-08 ENCOUNTER — Ambulatory Visit: Admitting: Family Medicine
